# Patient Record
Sex: Male | Born: 1960 | Race: White | Hispanic: No | Marital: Single | Smoking: Current every day smoker
Health system: Southern US, Community
[De-identification: ages and names within clinical notes are randomized; demographics above are authoritative.]

## PROBLEM LIST (undated history)

## (undated) DIAGNOSIS — F32A Depression, unspecified: Secondary | ICD-10-CM

## (undated) DIAGNOSIS — E119 Type 2 diabetes mellitus without complications: Secondary | ICD-10-CM

## (undated) DIAGNOSIS — F319 Bipolar disorder, unspecified: Secondary | ICD-10-CM

## (undated) DIAGNOSIS — F329 Major depressive disorder, single episode, unspecified: Secondary | ICD-10-CM

## (undated) DIAGNOSIS — J45909 Unspecified asthma, uncomplicated: Secondary | ICD-10-CM

## (undated) DIAGNOSIS — I10 Essential (primary) hypertension: Secondary | ICD-10-CM

## (undated) DIAGNOSIS — F410 Panic disorder [episodic paroxysmal anxiety] without agoraphobia: Secondary | ICD-10-CM

## (undated) DIAGNOSIS — F909 Attention-deficit hyperactivity disorder, unspecified type: Secondary | ICD-10-CM

## (undated) DIAGNOSIS — T7840XA Allergy, unspecified, initial encounter: Secondary | ICD-10-CM

## (undated) DIAGNOSIS — F4310 Post-traumatic stress disorder, unspecified: Secondary | ICD-10-CM

## (undated) HISTORY — DX: Panic disorder (episodic paroxysmal anxiety): F41.0

## (undated) HISTORY — DX: Allergy, unspecified, initial encounter: T78.40XA

## (undated) HISTORY — PX: CARDIAC SURGERY: SHX584

## (undated) HISTORY — DX: Depression, unspecified: F32.A

## (undated) HISTORY — DX: Major depressive disorder, single episode, unspecified: F32.9

## (undated) HISTORY — DX: Bipolar disorder, unspecified: F31.9

## (undated) HISTORY — DX: Post-traumatic stress disorder, unspecified: F43.10

## (undated) HISTORY — DX: Attention-deficit hyperactivity disorder, unspecified type: F90.9

## (undated) HISTORY — PX: FRACTURE SURGERY: SHX138

---

## 2001-10-10 ENCOUNTER — Emergency Department (HOSPITAL_COMMUNITY): Admission: EM | Admit: 2001-10-10 | Discharge: 2001-10-10 | Payer: Self-pay | Admitting: Emergency Medicine

## 2001-10-10 ENCOUNTER — Encounter: Payer: Self-pay | Admitting: Emergency Medicine

## 2006-05-29 ENCOUNTER — Emergency Department: Payer: Self-pay | Admitting: Emergency Medicine

## 2006-07-23 ENCOUNTER — Emergency Department: Payer: Self-pay | Admitting: Internal Medicine

## 2006-07-26 ENCOUNTER — Emergency Department: Payer: Self-pay | Admitting: Unknown Physician Specialty

## 2007-03-26 ENCOUNTER — Emergency Department: Payer: Self-pay | Admitting: Emergency Medicine

## 2011-05-28 DIAGNOSIS — I10 Essential (primary) hypertension: Secondary | ICD-10-CM | POA: Insufficient documentation

## 2011-05-28 DIAGNOSIS — I152 Hypertension secondary to endocrine disorders: Secondary | ICD-10-CM | POA: Insufficient documentation

## 2011-10-27 ENCOUNTER — Emergency Department: Payer: Self-pay | Admitting: *Deleted

## 2011-10-28 LAB — CBC WITH DIFFERENTIAL/PLATELET
Basophil #: 0.1 10*3/uL (ref 0.0–0.1)
Basophil %: 1 %
Eosinophil #: 0.3 10*3/uL (ref 0.0–0.7)
Eosinophil %: 2.8 %
HCT: 45.6 % (ref 40.0–52.0)
HGB: 15.8 g/dL (ref 13.0–18.0)
Lymphocyte #: 3.5 10*3/uL (ref 1.0–3.6)
Lymphocyte %: 29.5 %
MCH: 30.5 pg (ref 26.0–34.0)
MCHC: 34.7 g/dL (ref 32.0–36.0)
MCV: 88 fL (ref 80–100)
Monocyte #: 1.2 10*3/uL — ABNORMAL HIGH (ref 0.0–0.7)
Monocyte %: 9.7 %
Neutrophil #: 6.9 10*3/uL — ABNORMAL HIGH (ref 1.4–6.5)
Neutrophil %: 57 %
Platelet: 301 10*3/uL (ref 150–440)
RBC: 5.18 10*6/uL (ref 4.40–5.90)
RDW: 13.3 % (ref 11.5–14.5)
WBC: 12 10*3/uL — ABNORMAL HIGH (ref 3.8–10.6)

## 2011-10-28 LAB — TROPONIN I: Troponin-I: <0.02 ng/mL

## 2011-10-28 LAB — BASIC METABOLIC PANEL
Anion Gap: 12 (ref 7–16)
BUN: 19 mg/dL — ABNORMAL HIGH (ref 7–18)
Calcium, Total: 9.6 mg/dL (ref 8.5–10.1)
Chloride: 103 mmol/L (ref 98–107)
Co2: 24 mmol/L (ref 21–32)
Creatinine: 1.58 mg/dL — ABNORMAL HIGH (ref 0.60–1.30)
EGFR (African American): >60
EGFR (Non-African Amer.): 50 — ABNORMAL LOW
Glucose: 90 mg/dL (ref 65–99)
Osmolality: 279 (ref 275–301)
Potassium: 4.1 mmol/L (ref 3.5–5.1)
Sodium: 139 mmol/L (ref 136–145)

## 2011-10-28 LAB — CK TOTAL AND CKMB (NOT AT ARMC)
CK, Total: 414 U/L — ABNORMAL HIGH (ref 35–232)
CK-MB: 1 ng/mL (ref 0.5–3.6)

## 2012-02-21 ENCOUNTER — Emergency Department: Payer: Self-pay | Admitting: Emergency Medicine

## 2012-02-21 LAB — CBC
HCT: 44.1 % (ref 40.0–52.0)
HGB: 15.2 g/dL (ref 13.0–18.0)
MCH: 29.9 pg (ref 26.0–34.0)
MCHC: 34.5 g/dL (ref 32.0–36.0)
MCV: 87 fL (ref 80–100)
Platelet: 234 10*3/uL (ref 150–440)
RBC: 5.09 10*6/uL (ref 4.40–5.90)
RDW: 13.2 % (ref 11.5–14.5)
WBC: 9 10*3/uL (ref 3.8–10.6)

## 2012-02-21 LAB — COMPREHENSIVE METABOLIC PANEL
Albumin: 3.6 g/dL (ref 3.4–5.0)
Alkaline Phosphatase: 65 U/L (ref 50–136)
Anion Gap: 10 (ref 7–16)
BUN: 15 mg/dL (ref 7–18)
Bilirubin,Total: 0.4 mg/dL (ref 0.2–1.0)
Calcium, Total: 8.2 mg/dL — ABNORMAL LOW (ref 8.5–10.1)
Chloride: 107 mmol/L (ref 98–107)
Co2: 24 mmol/L (ref 21–32)
Creatinine: 1.1 mg/dL (ref 0.60–1.30)
EGFR (African American): >60
EGFR (Non-African Amer.): >60
Glucose: 115 mg/dL — ABNORMAL HIGH (ref 65–99)
Osmolality: 283 (ref 275–301)
Potassium: 4.1 mmol/L (ref 3.5–5.1)
SGOT(AST): 27 U/L (ref 15–37)
SGPT (ALT): 33 U/L
Sodium: 141 mmol/L (ref 136–145)
Total Protein: 7.4 g/dL (ref 6.4–8.2)

## 2012-02-21 LAB — PRO B NATRIURETIC PEPTIDE: B-Type Natriuretic Peptide: 19 pg/mL (ref 0–125)

## 2012-02-21 LAB — ETHANOL
Ethanol %: <0.003 % (ref 0.000–0.080)
Ethanol: <3 mg/dL

## 2012-02-21 LAB — TROPONIN I: Troponin-I: <0.02 ng/mL

## 2012-02-21 LAB — PROTIME-INR
INR: 0.9
Prothrombin Time: 12.7 secs (ref 11.5–14.7)

## 2012-02-21 LAB — ACETAMINOPHEN LEVEL: Acetaminophen: <2 ug/mL

## 2012-02-21 LAB — CK TOTAL AND CKMB (NOT AT ARMC)
CK, Total: 183 U/L (ref 35–232)
CK-MB: 0.8 ng/mL (ref 0.5–3.6)

## 2012-02-21 LAB — SALICYLATE LEVEL: Salicylates, Serum: 1.9 mg/dL

## 2012-05-10 ENCOUNTER — Emergency Department: Payer: Self-pay | Admitting: Internal Medicine

## 2014-10-21 ENCOUNTER — Emergency Department: Payer: Self-pay | Admitting: Internal Medicine

## 2015-01-27 ENCOUNTER — Emergency Department
Admit: 2015-01-27 | Disposition: A | Discharge status: Home / Self Care | Payer: Self-pay | Admitting: Emergency Medicine

## 2015-01-27 LAB — CBC
HCT: 44.5 % (ref 40.0–52.0)
HGB: 15.5 g/dL (ref 13.0–18.0)
MCH: 29.5 pg (ref 26.0–34.0)
MCHC: 34.8 g/dL (ref 32.0–36.0)
MCV: 85 fL (ref 80–100)
Platelet: 282 10*3/uL (ref 150–440)
RBC: 5.26 10*6/uL (ref 4.40–5.90)
RDW: 13.2 % (ref 11.5–14.5)
WBC: 12.2 10*3/uL — ABNORMAL HIGH (ref 3.8–10.6)

## 2015-01-27 LAB — COMPREHENSIVE METABOLIC PANEL
Albumin: 4.7 g/dL
Alkaline Phosphatase: 56 U/L
Anion Gap: 9 (ref 7–16)
BUN: 18 mg/dL
Bilirubin,Total: 0.7 mg/dL
Calcium, Total: 9.5 mg/dL
Chloride: 106 mmol/L
Co2: 21 mmol/L — ABNORMAL LOW
Creatinine: 1.58 mg/dL — ABNORMAL HIGH
EGFR (African American): 57 — ABNORMAL LOW
EGFR (Non-African Amer.): 49 — ABNORMAL LOW
Glucose: 139 mg/dL — ABNORMAL HIGH
Potassium: 4.3 mmol/L
SGOT(AST): 46 U/L — ABNORMAL HIGH
SGPT (ALT): 44 U/L
Sodium: 136 mmol/L
Total Protein: 8 g/dL

## 2015-01-27 LAB — URINALYSIS, COMPLETE
Bilirubin,UR: NEGATIVE
Blood: NEGATIVE
Glucose,UR: NEGATIVE mg/dL (ref 0–75)
Ketone: NEGATIVE
Leukocyte Esterase: NEGATIVE
Nitrite: NEGATIVE
Ph: 5 (ref 4.5–8.0)
Protein: NEGATIVE
Specific Gravity: >1.06 (ref 1.003–1.030)

## 2015-01-27 LAB — LIPASE, BLOOD: Lipase: 30 U/L

## 2015-01-27 LAB — TROPONIN I: Troponin-I: 0.03 ng/mL

## 2015-06-14 ENCOUNTER — Encounter: Payer: Self-pay | Admitting: Emergency Medicine

## 2015-06-14 ENCOUNTER — Emergency Department: Payer: Self-pay

## 2015-06-14 ENCOUNTER — Emergency Department
Admission: EM | Admit: 2015-06-14 | Discharge: 2015-06-14 | Disposition: A | Discharge status: Home / Self Care | Payer: Self-pay | Attending: Emergency Medicine | Admitting: Emergency Medicine

## 2015-06-14 DIAGNOSIS — Y9289 Other specified places as the place of occurrence of the external cause: Secondary | ICD-10-CM | POA: Insufficient documentation

## 2015-06-14 DIAGNOSIS — I10 Essential (primary) hypertension: Secondary | ICD-10-CM | POA: Insufficient documentation

## 2015-06-14 DIAGNOSIS — Z880 Allergy status to penicillin: Secondary | ICD-10-CM | POA: Insufficient documentation

## 2015-06-14 DIAGNOSIS — M545 Low back pain, unspecified: Secondary | ICD-10-CM

## 2015-06-14 DIAGNOSIS — S39012A Strain of muscle, fascia and tendon of lower back, initial encounter: Secondary | ICD-10-CM | POA: Insufficient documentation

## 2015-06-14 DIAGNOSIS — M5136 Other intervertebral disc degeneration, lumbar region: Secondary | ICD-10-CM | POA: Insufficient documentation

## 2015-06-14 DIAGNOSIS — X58XXXA Exposure to other specified factors, initial encounter: Secondary | ICD-10-CM | POA: Insufficient documentation

## 2015-06-14 DIAGNOSIS — Y9389 Activity, other specified: Secondary | ICD-10-CM | POA: Insufficient documentation

## 2015-06-14 DIAGNOSIS — Y998 Other external cause status: Secondary | ICD-10-CM | POA: Insufficient documentation

## 2015-06-14 DIAGNOSIS — M62830 Muscle spasm of back: Secondary | ICD-10-CM | POA: Insufficient documentation

## 2015-06-14 HISTORY — DX: Panic disorder (episodic paroxysmal anxiety): F41.0

## 2015-06-14 HISTORY — DX: Essential (primary) hypertension: I10

## 2015-06-14 MED ORDER — DIAZEPAM 5 MG/ML IJ SOLN
5.0000 mg | Freq: Once | INTRAMUSCULAR | Status: AC
  Administered 2015-06-14: 5 mg via INTRAVENOUS
  Filled 2015-06-14: qty 2

## 2015-06-14 MED ORDER — DIAZEPAM 5 MG PO TABS: 5.0000 mg | ORAL_TABLET | Freq: Three times a day (TID) | ORAL | Status: DC | PRN

## 2015-06-14 MED ORDER — HYDROMORPHONE HCL 1 MG/ML IJ SOLN
1.0000 mg | Freq: Once | INTRAMUSCULAR | Status: AC
  Administered 2015-06-14: 1 mg via INTRAVENOUS
  Filled 2015-06-14: qty 1

## 2015-06-14 MED ORDER — DEXAMETHASONE SODIUM PHOSPHATE 10 MG/ML IJ SOLN
10.0000 mg | Freq: Once | INTRAMUSCULAR | Status: AC
  Administered 2015-06-14: 10 mg via INTRAVENOUS
  Filled 2015-06-14: qty 1

## 2015-06-14 MED ORDER — KETOROLAC TROMETHAMINE 30 MG/ML IJ SOLN
30.0000 mg | Freq: Once | INTRAMUSCULAR | Status: AC
  Administered 2015-06-14: 30 mg via INTRAVENOUS
  Filled 2015-06-14: qty 1

## 2015-06-14 MED ORDER — OXYCODONE-ACETAMINOPHEN 5-325 MG PO TABS: 1.0000 | ORAL_TABLET | Freq: Four times a day (QID) | ORAL | Status: DC | PRN

## 2015-06-14 MED ORDER — PREDNISONE 50 MG PO TABS: ORAL_TABLET | ORAL | Status: DC

## 2015-06-14 NOTE — ED Notes (Signed)
Pt c/o low back today, hx of back pain and broken tailbone in the past. States he heard something pop today and now having severe low back pain.

## 2015-06-14 NOTE — Discharge Instructions (Signed)
Back Exercises Back exercises help treat and prevent back injuries. The goal of back exercises is to increase the strength of your abdominal and back muscles and the flexibility of your back. These exercises should be started when you no longer have back pain. Back exercises include:  Pelvic Tilt. Lie on your back with your knees bent. Tilt your pelvis until the lower part of your back is against the floor. Hold this position 5 to 10 sec and repeat 5 to 10 times.  Knee to Chest. Pull first 1 knee up against your chest and hold for 20 to 30 seconds, repeat this with the other knee, and then both knees. This may be done with the other leg straight or bent, whichever feels better.  Sit-Ups or Curl-Ups. Bend your knees 90 degrees. Start with tilting your pelvis, and do a partial, slow sit-up, lifting your trunk only 30 to 45 degrees off the floor. Take at least 2 to 3 seconds for each sit-up. Do not do sit-ups with your knees out straight. If partial sit-ups are difficult, simply do the above but with only tightening your abdominal muscles and holding it as directed.  Hip-Lift. Lie on your back with your knees flexed 90 degrees. Push down with your feet and shoulders as you raise your hips a couple inches off the floor; hold for 10 seconds, repeat 5 to 10 times.  Back arches. Lie on your stomach, propping yourself up on bent elbows. Slowly press on your hands, causing an arch in your low back. Repeat 3 to 5 times. Any initial stiffness and discomfort should lessen with repetition over time.  Shoulder-Lifts. Lie face down with arms beside your body. Keep hips and torso pressed to floor as you slowly lift your head and shoulders off the floor. Do not overdo your exercises, especially in the beginning. Exercises may cause you some mild back discomfort which lasts for a few minutes; however, if the pain is more severe, or lasts for more than 15 minutes, do not continue exercises until you see your caregiver.  Improvement with exercise therapy for back problems is slow.  See your caregivers for assistance with developing a proper back exercise program. Document Released: 10/25/2004 Document Revised: 12/10/2011 Document Reviewed: 07/19/2011 [facility] Patient Information 2015 [place], [place]. This information is not intended to replace advice given to you by your health care provider. Make sure you discuss any questions you have with your health care provider.  Low Back Sprain with Rehab  A sprain is an injury in which a ligament is torn. The ligaments of the lower back are vulnerable to sprains. However, they are strong and require great force to be injured. These ligaments are important for stabilizing the spinal column. Sprains are classified into three categories. Grade 1 sprains cause pain, but the tendon is not lengthened. Grade 2 sprains include a lengthened ligament, due to the ligament being stretched or partially ruptured. With grade 2 sprains there is still function, although the function may be decreased. Grade 3 sprains involve a complete tear of the tendon or muscle, and function is usually impaired. SYMPTOMS   Severe pain in the lower back.  Sometimes, a feeling of a "pop," "snap," or tear, at the time of injury.  Tenderness and sometimes swelling at the injury site.  Uncommonly, bruising (contusion) within 48 hours of injury.  Muscle spasms in the back. CAUSES  Low back sprains occur when a force is placed on the ligaments that is greater than they can  handle. Common causes of injury include:  Performing a stressful act while off-balance.  Repetitive stressful activities that involve movement of the lower back.  Direct hit (trauma) to the lower back. RISK INCREASES WITH:  Contact sports (football, wrestling).  Collisions (major skiing accidents).  Sports that require throwing or lifting (baseball, weightlifting).  Sports involving twisting of the spine (gymnastics, diving,  tennis, golf).  Poor strength and flexibility.  Inadequate protection.  Previous back injury or surgery (especially fusion). PREVENTION  Wear properly fitted and padded protective equipment.  Warm up and stretch properly before activity.  Allow for adequate recovery between workouts.  Maintain physical fitness:  Strength, flexibility, and endurance.  Cardiovascular fitness.  Maintain a healthy body weight. PROGNOSIS  If treated properly, low back sprains usually heal with non-surgical treatment. The length of time for healing depends on the severity of the injury.  RELATED COMPLICATIONS   Recurring symptoms, resulting in a chronic problem.  Chronic inflammation and pain in the low back.  Delayed healing or resolution of symptoms, especially if activity is resumed too soon.  Prolonged impairment.  Unstable or arthritic joints of the low back. TREATMENT  Treatment first involves the use of ice and medicine, to reduce pain and inflammation. The use of strengthening and stretching exercises may help reduce pain with activity. These exercises may be performed at home or with a therapist. Severe injuries may require referral to a therapist for further evaluation and treatment, such as ultrasound. Your caregiver may advise that you wear a back brace or corset, to help reduce pain and discomfort. Often, prolonged bed rest results in greater harm then benefit. Corticosteroid injections may be recommended. However, these should be reserved for the most serious cases. It is important to avoid using your back when lifting objects. At night, sleep on your back on a firm mattress, with a pillow placed under your knees. If non-surgical treatment is unsuccessful, surgery may be needed.  MEDICATION   If pain medicine is needed, nonsteroidal anti-inflammatory medicines (aspirin and ibuprofen), or other minor pain relievers (acetaminophen), are often advised.  Do not take pain medicine for 7  days before surgery.  Prescription pain relievers may be given, if your caregiver thinks they are needed. Use only as directed and only as much as you need.  Ointments applied to the skin may be helpful.  Corticosteroid injections may be given by your caregiver. These injections should be reserved for the most serious cases, because they may only be given a certain number of times. HEAT AND COLD  Cold treatment (icing) should be applied for 10 to 15 minutes every 2 to 3 hours for inflammation and pain, and immediately after activity that aggravates your symptoms. Use ice packs or an ice massage.  Heat treatment may be used before performing stretching and strengthening activities prescribed by your caregiver, physical therapist, or athletic trainer. Use a heat pack or a warm water soak. SEEK MEDICAL CARE IF:   Symptoms get worse or do not improve in 2 to 4 weeks, despite treatment.  You develop numbness or weakness in either leg.  You lose bowel or bladder function.  Any of the following occur after surgery: fever, increased pain, swelling, redness, drainage of fluids, or bleeding in the affected area.  New, unexplained symptoms develop. (Drugs used in treatment may produce side effects.) EXERCISES  RANGE OF MOTION (ROM) AND STRETCHING EXERCISES - Low Back Sprain Most people with lower back pain will find that their symptoms get worse with  excessive bending forward (flexion) or arching at the lower back (extension). The exercises that will help resolve your symptoms will focus on the opposite motion.  Your physician, physical therapist or athletic trainer will help you determine which exercises will be most helpful to resolve your lower back pain. Do not complete any exercises without first consulting with your caregiver. Discontinue any exercises which make your symptoms worse, until you speak to your caregiver. If you have pain, numbness or tingling which travels down into your buttocks,  leg or foot, the goal of the therapy is for these symptoms to move closer to your back and eventually resolve. Sometimes, these leg symptoms will get better, but your lower back pain may worsen. This is often an indication of progress in your rehabilitation. Be very alert to any changes in your symptoms and the activities in which you participated in the 24 hours prior to the change. Sharing this information with your caregiver will allow him or her to most efficiently treat your condition. These exercises may help you when beginning to rehabilitate your injury. Your symptoms may resolve with or without further involvement from your physician, physical therapist or athletic trainer. While completing these exercises, remember:   Restoring tissue flexibility helps normal motion to return to the joints. This allows healthier, less painful movement and activity.  An effective stretch should be held for at least 30 seconds.  A stretch should never be painful. You should only feel a gentle lengthening or release in the stretched tissue. FLEXION RANGE OF MOTION AND STRETCHING EXERCISES: STRETCH - Flexion, Single Knee to Chest   Lie on a firm bed or floor with both legs extended in front of you.  Keeping one leg in contact with the floor, bring your opposite knee to your chest. Hold your leg in place by either grabbing behind your thigh or at your knee.  Pull until you feel a gentle stretch in your low back. Hold __________ seconds.  Slowly release your grasp and repeat the exercise with the opposite side. Repeat __________ times. Complete this exercise __________ times per day.  STRETCH - Flexion, Double Knee to Chest  Lie on a firm bed or floor with both legs extended in front of you.  Keeping one leg in contact with the floor, bring your opposite knee to your chest.  Tense your stomach muscles to support your back and then lift your other knee to your chest. Hold your legs in place by either  grabbing behind your thighs or at your knees.  Pull both knees toward your chest until you feel a gentle stretch in your low back. Hold __________ seconds.  Tense your stomach muscles and slowly return one leg at a time to the floor. Repeat __________ times. Complete this exercise __________ times per day.  STRETCH - Low Trunk Rotation  Lie on a firm bed or floor. Keeping your legs in front of you, bend your knees so they are both pointed toward the ceiling and your feet are flat on the floor.  Extend your arms out to the side. This will stabilize your upper body by keeping your shoulders in contact with the floor.  Gently and slowly drop both knees together to one side until you feel a gentle stretch in your low back. Hold for __________ seconds.  Tense your stomach muscles to support your lower back as you bring your knees back to the starting position. Repeat the exercise to the other side. Repeat __________ times. Complete this  exercise __________ times per day  EXTENSION RANGE OF MOTION AND FLEXIBILITY EXERCISES: STRETCH - Extension, Prone on Elbows   Lie on your stomach on the floor, a bed will be too soft. Place your palms about shoulder width apart and at the height of your head.  Place your elbows under your shoulders. If this is too painful, stack pillows under your chest.  Allow your body to relax so that your hips drop lower and make contact more completely with the floor.  Hold this position for __________ seconds.  Slowly return to lying flat on the floor. Repeat __________ times. Complete this exercise __________ times per day.  RANGE OF MOTION - Extension, Prone Press Ups  Lie on your stomach on the floor, a bed will be too soft. Place your palms about shoulder width apart and at the height of your head.  Keeping your back as relaxed as possible, slowly straighten your elbows while keeping your hips on the floor. You may adjust the placement of your hands to maximize  your comfort. As you gain motion, your hands will come more underneath your shoulders.  Hold this position __________ seconds.  Slowly return to lying flat on the floor. Repeat __________ times. Complete this exercise __________ times per day.  RANGE OF MOTION- Quadruped, Neutral Spine   Assume a hands and knees position on a firm surface. Keep your hands under your shoulders and your knees under your hips. You may place padding under your knees for comfort.  Drop your head and point your tailbone toward the ground below you. This will round out your lower back like an angry cat. Hold this position for __________ seconds.  Slowly lift your head and release your tail bone so that your back sags into a large arch, like an old horse.  Hold this position for __________ seconds.  Repeat this until you feel limber in your low back.  Now, find your "sweet spot." This will be the most comfortable position somewhere between the two previous positions. This is your neutral spine. Once you have found this position, tense your stomach muscles to support your low back.  Hold this position for __________ seconds. Repeat __________ times. Complete this exercise __________ times per day.  STRENGTHENING EXERCISES - Low Back Sprain These exercises may help you when beginning to rehabilitate your injury. These exercises should be done near your "sweet spot." This is the neutral, low-back arch, somewhere between fully rounded and fully arched, that is your least painful position. When performed in this safe range of motion, these exercises can be used for people who have either a flexion or extension based injury. These exercises may resolve your symptoms with or without further involvement from your physician, physical therapist or athletic trainer. While completing these exercises, remember:   Muscles can gain both the endurance and the strength needed for everyday activities through controlled  exercises.  Complete these exercises as instructed by your physician, physical therapist or athletic trainer. Increase the resistance and repetitions only as guided.  You may experience muscle soreness or fatigue, but the pain or discomfort you are trying to eliminate should never worsen during these exercises. If this pain does worsen, stop and make certain you are following the directions exactly. If the pain is still present after adjustments, discontinue the exercise until you can discuss the trouble with your caregiver. STRENGTHENING - Deep Abdominals, Pelvic Tilt   Lie on a firm bed or floor. Keeping your legs in front of you,  bend your knees so they are both pointed toward the ceiling and your feet are flat on the floor.  Tense your lower abdominal muscles to press your low back into the floor. This motion will rotate your pelvis so that your tail bone is scooping upwards rather than pointing at your feet or into the floor. With a gentle tension and even breathing, hold this position for __________ seconds. Repeat __________ times. Complete this exercise __________ times per day.  STRENGTHENING - Abdominals, Crunches   Lie on a firm bed or floor. Keeping your legs in front of you, bend your knees so they are both pointed toward the ceiling and your feet are flat on the floor. Cross your arms over your chest.  Slightly tip your chin down without bending your neck.  Tense your abdominals and slowly lift your trunk high enough to just clear your shoulder blades. Lifting higher can put excessive stress on the lower back and does not further strengthen your abdominal muscles.  Control your return to the starting position. Repeat __________ times. Complete this exercise __________ times per day.  STRENGTHENING - Quadruped, Opposite UE/LE Lift   Assume a hands and knees position on a firm surface. Keep your hands under your shoulders and your knees under your hips. You may place padding under  your knees for comfort.  Find your neutral spine and gently tense your abdominal muscles so that you can maintain this position. Your shoulders and hips should form a rectangle that is parallel with the floor and is not twisted.  Keeping your trunk steady, lift your right hand no higher than your shoulder and then your left leg no higher than your hip. Make sure you are not holding your breath. Hold this position for __________ seconds.  Continuing to keep your abdominal muscles tense and your back steady, slowly return to your starting position. Repeat with the opposite arm and leg. Repeat __________ times. Complete this exercise __________ times per day.  STRENGTHENING - Abdominals and Quadriceps, Straight Leg Raise   Lie on a firm bed or floor with both legs extended in front of you.  Keeping one leg in contact with the floor, bend the other knee so that your foot can rest flat on the floor.  Find your neutral spine, and tense your abdominal muscles to maintain your spinal position throughout the exercise.  Slowly lift your straight leg off the floor about 6 inches for a count of 15, making sure to not hold your breath.  Still keeping your neutral spine, slowly lower your leg all the way to the floor. Repeat this exercise with each leg __________ times. Complete this exercise __________ times per day. POSTURE AND BODY MECHANICS CONSIDERATIONS - Low Back Sprain Keeping correct posture when sitting, standing or completing your activities will reduce the stress put on different body tissues, allowing injured tissues a chance to heal and limiting painful experiences. The following are general guidelines for improved posture. Your physician or physical therapist will provide you with any instructions specific to your needs. While reading these guidelines, remember:  The exercises prescribed by your provider will help you have the flexibility and strength to maintain correct postures.  The  correct posture provides the best environment for your joints to work. All of your joints have less wear and tear when properly supported by a spine with good posture. This means you will experience a healthier, less painful body.  Correct posture must be practiced with all of your activities,  especially prolonged sitting and standing. Correct posture is as important when doing repetitive low-stress activities (typing) as it is when doing a single heavy-load activity (lifting). RESTING POSITIONS Consider which positions are most painful for you when choosing a resting position. If you have pain with flexion-based activities (sitting, bending, stooping, squatting), choose a position that allows you to rest in a less flexed posture. You would want to avoid curling into a fetal position on your side. If your pain worsens with extension-based activities (prolonged standing, working overhead), avoid resting in an extended position such as sleeping on your stomach. Most people will find more comfort when they rest with their spine in a more neutral position, neither too rounded nor too arched. Lying on a non-sagging bed on your side with a pillow between your knees, or on your back with a pillow under your knees will often provide some relief. Keep in mind, being in any one position for a prolonged period of time, no matter how correct your posture, can still lead to stiffness. PROPER SITTING POSTURE In order to minimize stress and discomfort on your spine, you must sit with correct posture. Sitting with good posture should be effortless for a healthy body. Returning to good posture is a gradual process. Many people can work toward this most comfortably by using various supports until they have the flexibility and strength to maintain this posture on their own. When sitting with proper posture, your ears will fall over your shoulders and your shoulders will fall over your hips. You should use the back of the chair  to support your upper back. Your lower back will be in a neutral position, just slightly arched. You may place a small pillow or folded towel at the base of your lower back for  support.  When working at a desk, create an environment that supports good, upright posture. Without extra support, muscles tire, which leads to excessive strain on joints and other tissues. Keep these recommendations in mind: CHAIR:  A chair should be able to slide under your desk when your back makes contact with the back of the chair. This allows you to work closely.  The chair's height should allow your eyes to be level with the upper part of your monitor and your hands to be slightly lower than your elbows. BODY POSITION  Your feet should make contact with the floor. If this is not possible, use a foot rest.  Keep your ears over your shoulders. This will reduce stress on your neck and low back. INCORRECT SITTING POSTURES  If you are feeling tired and unable to assume a healthy sitting posture, do not slouch or slump. This puts excessive strain on your back tissues, causing more damage and pain. Healthier options include:  Using more support, like a lumbar pillow.  Switching tasks to something that requires you to be upright or walking.  Talking a brief walk.  Lying down to rest in a neutral-spine position. PROLONGED STANDING WHILE SLIGHTLY LEANING FORWARD  When completing a task that requires you to lean forward while standing in one place for a long time, place either foot up on a stationary 2-4 inch high object to help maintain the best posture. When both feet are on the ground, the lower back tends to lose its slight inward curve. If this curve flattens (or becomes too large), then the back and your other joints will experience too much stress, tire more quickly, and can cause pain. CORRECT STANDING POSTURES Proper standing  posture should be assumed with all daily activities, even if they only take a few  moments, like when brushing your teeth. As in sitting, your ears should fall over your shoulders and your shoulders should fall over your hips. You should keep a slight tension in your abdominal muscles to brace your spine. Your tailbone should point down to the ground, not behind your body, resulting in an over-extended swayback posture.  INCORRECT STANDING POSTURES  Common incorrect standing postures include a forward head, locked knees and/or an excessive swayback. WALKING Walk with an upright posture. Your ears, shoulders and hips should all line-up. PROLONGED ACTIVITY IN A FLEXED POSITION When completing a task that requires you to bend forward at your waist or lean over a low surface, try to find a way to stabilize 3 out of 4 of your limbs. You can place a hand or elbow on your thigh or rest a knee on the surface you are reaching across. This will provide you more stability, so that your muscles do not tire as quickly. By keeping your knees relaxed, or slightly bent, you will also reduce stress across your lower back. CORRECT LIFTING TECHNIQUES DO :  Assume a wide stance. This will provide you more stability and the opportunity to get as close as possible to the object which you are lifting.  Tense your abdominals to brace your spine. Bend at the knees and hips. Keeping your back locked in a neutral-spine position, lift using your leg muscles. Lift with your legs, keeping your back straight.  Test the weight of unknown objects before attempting to lift them.  Try to keep your elbows locked down at your sides in order get the best strength from your shoulders when carrying an object.  Always ask for help when lifting heavy or awkward objects. INCORRECT LIFTING TECHNIQUES DO NOT:   Lock your knees when lifting, even if it is a small object.  Bend and twist. Pivot at your feet or move your feet when needing to change directions.  Assume that you can safely pick up even a paperclip  without proper posture. Document Released: 09/17/2005 Document Revised: 12/10/2011 Document Reviewed: 12/30/2008 [facility] Patient Information 2015 [place], [place]. This information is not intended to replace advice given to you by your health care provider. Make sure you discuss any questions you have with your health care provider.  Degenerative Disk Disease Degenerative disk disease is a condition caused by the changes that occur in the cushions of the backbone (spinal disks) as you grow older. Spinal disks are soft and compressible disks located between the bones of the spine (vertebrae). They act like shock absorbers. Degenerative disk disease can affect the whole spine. However, the neck and lower back are most commonly affected. Many changes can occur in the spinal disks with aging, such as:  The spinal disks may dry and shrink.  Small tears may occur in the tough, outer covering of the disk (annulus).  The disk space may become smaller due to loss of water.  Abnormal growths in the bone (spurs) may occur. This can put pressure on the nerve roots exiting the spinal canal, causing pain.  The spinal canal may become narrowed. CAUSES  Degenerative disk disease is a condition caused by the changes that occur in the spinal disks with aging. The exact cause is not known, but there is a genetic basis for many patients. Degenerative changes can occur due to loss of fluid in the disk. This makes the disk thinner  and reduces the space between the backbones. Small cracks can develop in the outer layer of the disk. This can lead to the breakdown of the disk. You are more likely to get degenerative disk disease if you are overweight. Smoking cigarettes and doing heavy work such as weightlifting can also increase your risk of this condition. Degenerative changes can start after a sudden injury. Growth of bone spurs can compress the nerve roots and cause pain.  SYMPTOMS  The symptoms vary from person to  person. Some people may have no pain, while others have severe pain. The pain may be so severe that it can limit your activities. The location of the pain depends on the part of your backbone that is affected. You will have neck or arm pain if a disk in the neck area is affected. You will have pain in your back, buttocks, or legs if a disk in the lower back is affected. The pain becomes worse while bending, reaching up, or with twisting movements. The pain may start gradually and then get worse as time passes. It may also start after a major or minor injury. You may feel numbness or tingling in the arms or legs.  DIAGNOSIS  Your caregiver will ask you about your symptoms and about activities or habits that may cause the pain. He or she may also ask about any injuries, diseases, or treatments you have had earlier. Your caregiver will examine you to check for the range of movement that is possible in the affected area, to check for strength in your extremities, and to check for sensation in the areas of the arms and legs supplied by different nerve roots. An X-ray of the spine may be taken. Your caregiver may suggest other imaging tests, such as magnetic resonance imaging (MRI), if needed.  TREATMENT  Treatment includes rest, modifying your activities, and applying ice and heat. Your caregiver may prescribe medicines to reduce your pain and may ask you to do some exercises to strengthen your back. In some cases, you may need surgery. You and your caregiver will decide on the treatment that is best for you. HOME CARE INSTRUCTIONS   Follow proper lifting and walking techniques as advised by your caregiver.  Maintain good posture.  Exercise regularly as advised.  Perform relaxation exercises.  Change your sitting, standing, and sleeping habits as advised. Change positions frequently.  Lose weight as advised.  Stop smoking if you smoke.  Wear supportive footwear. SEEK MEDICAL CARE IF:  Your pain  does not go away within 1 to 4 weeks. SEEK IMMEDIATE MEDICAL CARE IF:   Your pain is severe.  You notice weakness in your arms, hands, or legs.  You begin to lose control of your bladder or bowel movements. MAKE SURE YOU:   Understand these instructions.  Will watch your condition.  Will get help right away if you are not doing well or get worse. Document Released: 07/15/2007 Document Revised: 12/10/2011 Document Reviewed: 01/19/2014 [facility] Patient Information 2015 [place], [place]. This information is not intended to replace advice given to you by your health care provider. Make sure you discuss any questions you have with your health care provider.

## 2015-06-14 NOTE — ED Provider Notes (Addendum)
[facility] Emergency Department Provider Note     Time seen: ----------------------------------------- 1:59 PM on 06/14/2015 -----------------------------------------    I have reviewed the triage vital signs and the nursing notes.   HISTORY  Chief Complaint Back Pain    HPI Ronald Lucas is a 54 y.o. male who presents ER with severe low back pain. Patient states he went to get up from a sitting position when he felt a pop and heard a pop in his low back. He's been having severe low back pain since that period of time. He states in the remote past he had injured his back years ago but it resolved spontaneously. He described his arms and legs going numb earlier.   Past Medical History  Diagnosis Date  . Hypertension   . Panic attacks     There are no active problems to display for this patient.   No past surgical history on file.  Allergies Buspar; Morphine and related; Penicillins; and Tetanus toxoids  Social History Social History  Substance Use Topics  . Smoking status: Current Some Day Smoker  . Smokeless tobacco: None  . Alcohol Use: No    Review of Systems Constitutional: Negative for fever. Eyes: Negative for visual changes. ENT: Negative for sore throat. Cardiovascular: Negative for chest pain. Respiratory: Negative for shortness of breath. Gastrointestinal: Negative for abdominal pain, vomiting and diarrhea. Genitourinary: Negative for dysuria. Musculoskeletal: Positive for low back pain Skin: Negative for rash. Neurological: Negative for headaches, focal weakness. Positive for numbness in his arms and legs  10-point ROS otherwise negative.  ____________________________________________   PHYSICAL EXAM:  VITAL SIGNS: ED Triage Vitals  Enc Vitals Group     BP 06/14/15 1343 151/127 mmHg     Pulse Rate 06/14/15 1341 105     Resp 06/14/15 1341 20     Temp 06/14/15 1341 98.1 F (36.7 C)     Temp Source 06/14/15  1341 Oral     SpO2 06/14/15 1341 98 %     Weight 06/14/15 1342 315 lb (142.883 kg)     Height 06/14/15 1341 5\' 11"  (1.803 m)     Head Cir --      Peak Flow --      Pain Score 06/14/15 1342 10     Pain Loc --      Pain Edu? --      Excl. in [place]? --     Constitutional: Alert and oriented. Mild to moderate distress, very anxious Eyes: Conjunctivae are normal. PERRL. Normal extraocular movements. ENT   Head: Normocephalic and atraumatic.   Nose: No congestion/rhinnorhea.   Mouth/Throat: Mucous membranes are moist.   Neck: No stridor. Cardiovascular: Normal rate, regular rhythm. Normal and symmetric distal pulses are present in all extremities. No murmurs, rubs, or gallops. Respiratory: Normal respiratory effort without tachypnea nor retractions. Breath sounds are clear and equal bilaterally. No wheezes/rales/rhonchi. Gastrointestinal: Soft and nontender. No distention. No abdominal bruits.  Musculoskeletal: Back is tender to touch particularly in the lumbar sacral spine. There is decreased range of motion of the low back Neurologic:  Normal speech and language. No gross focal neurologic deficits are appreciated. Speech is normal.  Skin:  Skin is warm, diaphoresis is noted.  ____________________________________________  ED COURSE:  Pertinent labs & imaging results that were available during my care of the patient were reviewed by me and considered in my medical decision making (see chart for details). Patient will be evaluated with plain films, will receive IV anxiolytics  muscle relaxants and pain medicine.  RADIOLOGY Images were viewed by me  Lumbar spine x-rays Are unremarkable CT was obtained due to persistent low back pain IMPRESSION: 1. No acute osseous abnormality. 2. L4-L5 predominant lumbar degenerative disc disease with central disc protrusion at L4-L5 producing at least mild and probably moderate central stenosis. Consider MRI for further  assessment. ____________________________________________  FINAL ASSESSMENT AND PLAN  Lumbosacral strain, degenerative disc disease  Plan: Patient with labs and imaging as dictated above. Patient with pain out of proportion to exam. He was given IV medications to subdue his symptoms. He'll be discharged with Motrin, prednisone and Valium and encouraged to have close follow-up with his doctor. He is advised he will need an MRI of his lumbar spine. He does not have any radicular pain, no neurosensory deficits at this time.   Earleen Newport, MD   Earleen Newport, MD 06/14/15 [place], MD 06/14/15 1630

## 2015-06-23 ENCOUNTER — Telehealth: Payer: Self-pay | Admitting: Emergency Medicine

## 2015-06-23 NOTE — ED Notes (Signed)
Patient had called saying that the er doctor told him he would give him enough pain meds until his mri.  Says mri is scheduled October?3 and he is out of meds.  He said his doctor has not helped him with his pain, and he does not want to return there.  i told him that we could not give refills from the ED, and that he can go to [facility] for pain management.  He thinks he may go there.

## 2015-07-04 ENCOUNTER — Encounter: Payer: Self-pay | Admitting: Emergency Medicine

## 2015-07-04 ENCOUNTER — Emergency Department
Admission: EM | Admit: 2015-07-04 | Discharge: 2015-07-04 | Disposition: A | Discharge status: Home / Self Care | Payer: Self-pay | Attending: Student | Admitting: Student

## 2015-07-04 DIAGNOSIS — Z880 Allergy status to penicillin: Secondary | ICD-10-CM | POA: Insufficient documentation

## 2015-07-04 DIAGNOSIS — N319 Neuromuscular dysfunction of bladder, unspecified: Secondary | ICD-10-CM | POA: Insufficient documentation

## 2015-07-04 DIAGNOSIS — R1110 Vomiting, unspecified: Secondary | ICD-10-CM | POA: Insufficient documentation

## 2015-07-04 DIAGNOSIS — M5440 Lumbago with sciatica, unspecified side: Secondary | ICD-10-CM | POA: Insufficient documentation

## 2015-07-04 DIAGNOSIS — Z720 Tobacco use: Secondary | ICD-10-CM | POA: Insufficient documentation

## 2015-07-04 DIAGNOSIS — I10 Essential (primary) hypertension: Secondary | ICD-10-CM | POA: Insufficient documentation

## 2015-07-04 MED ORDER — OXYCODONE-ACETAMINOPHEN 5-325 MG PO TABS: 1.0000 | ORAL_TABLET | ORAL | Status: DC | PRN

## 2015-07-04 MED ORDER — OXYCODONE-ACETAMINOPHEN 5-325 MG PO TABS
2.0000 | ORAL_TABLET | Freq: Once | ORAL | Status: AC
  Administered 2015-07-04: 2 via ORAL
  Filled 2015-07-04: qty 2

## 2015-07-04 MED ORDER — KETOROLAC TROMETHAMINE 60 MG/2ML IM SOLN
60.0000 mg | Freq: Once | INTRAMUSCULAR | Status: AC
  Administered 2015-07-04: 60 mg via INTRAMUSCULAR
  Filled 2015-07-04: qty 2

## 2015-07-04 MED ORDER — CYCLOBENZAPRINE HCL 10 MG PO TABS
5.0000 mg | ORAL_TABLET | Freq: Once | ORAL | Status: DC
  Filled 2015-07-04: qty 2

## 2015-07-04 NOTE — ED Notes (Signed)
States he is having lower back pain ..which radiates into both legs   Worse to right side.unble to bear wt.

## 2015-07-04 NOTE — ED Provider Notes (Signed)
[facility] Emergency Department Provider Note  ____________________________________________  Time seen: Approximately 3:29 PM  I have reviewed the triage vital signs and the nursing notes.   HISTORY  Chief Complaint Back Pain   HPI Ronald Lucas is a 54 y.o. male presents from Orthopedic clinic for an MRI for evaluation of Cauda Equina syndrome. He presents with complaints of bowel and bladder dysfunction as well as severe low back pain which has caused him to vomit this am.   Past Medical History  Diagnosis Date  . Hypertension   . Panic attacks     There are no active problems to display for this patient.   History reviewed. No pertinent past surgical history.  Current Outpatient Rx  Name  Route  Sig  Dispense  Refill  . oxyCODONE-acetaminophen (ROXICET) 5-325 MG tablet   Oral   Take 1-2 tablets by mouth every 4 (four) hours as needed for severe pain.   8 tablet   0     Allergies Buspar; Morphine and related; Penicillins; Prednisone; and Tetanus toxoids  No family history on file.  Social History Social History  Substance Use Topics  . Smoking status: Current Some Day Smoker  . Smokeless tobacco: None  . Alcohol Use: No    Review of Systems Constitutional: No fever/chills Eyes: No visual changes. ENT: No sore throat. Cardiovascular: Denies chest pain. Respiratory: Denies shortness of breath. Gastrointestinal: No abdominal pain.  No nausea, no vomiting.  No diarrhea.  No constipation. Genitourinary: Negative for dysuria. Musculoskeletal: Positive for severe low back pain. Skin: Negative for rash. Neurological: Negative for headaches, focal weakness or numbness.  10-point ROS otherwise negative.  ____________________________________________   PHYSICAL EXAM:  VITAL SIGNS: ED Triage Vitals  Enc Vitals Group     BP 07/04/15 1520 147/84 mmHg     Pulse Rate 07/04/15 1520 88     Resp 07/04/15 1520 18     Temp 07/04/15  1520 97.6 F (36.4 C)     Temp Source 07/04/15 1520 Oral     SpO2 07/04/15 1520 96 %     Weight 07/04/15 1520 315 lb (142.883 kg)     Height 07/04/15 1520 6\' 2"  (1.88 m)     Head Cir --      Peak Flow --      Pain Score 07/04/15 1516 9     Pain Loc --      Pain Edu? --      Excl. in [place]? --     Constitutional: Alert and oriented. Patient laying facedown on a stretcher unable to get up. Eyes: Conjunctivae are normal. PERRL. EOMI. Head: Atraumatic. Nose: No congestion/rhinnorhea. Mouth/Throat: Mucous membranes are moist.  Oropharynx non-erythematous. Neck: No stridor.   Cardiovascular: Normal rate, regular rhythm. Grossly normal heart sounds.  Good peripheral circulation. Respiratory: Normal respiratory effort.  No retractions. Lungs CTAB. Musculoskeletal: No lower extremity tenderness nor edema.  No joint effusions. Neurologic:  Normal speech and language. No gross focal neurologic deficits are appreciated. No gait instability. Skin:  Skin is warm, dry and intact. No rash noted. Psychiatric: Mood and affect are normal. Speech and behavior are normal.  ____________________________________________   LABS (all labs ordered are listed, but only abnormal results are displayed)  Labs Reviewed - No data to display   RADIOLOGY  MRI lumbar spine: ____________________________________________   PROCEDURES  Procedure(s) performed: None  Critical Care performed: No  ____________________________________________   INITIAL IMPRESSION / ASSESSMENT AND PLAN / ED COURSE  Pertinent labs & imaging results that were available during my care of the patient were reviewed by me and considered in my medical decision making (see chart for details).  Low back pain. Patient here to rule out cauda equina but does not want to wait the 2 hours for an MRI. States that he would like to be discharged and go over to [facility] or to himself. Patient reports that he will have a driver taken to  [facility].  Patient voices no other emergency medical complaints at this time. ____________________________________________   FINAL CLINICAL IMPRESSION(S) / ED DIAGNOSES  Final diagnoses:  Midline low back pain with sciatica, sciatica laterality unspecified      Arlyss Repress, PA-C 07/04/15 1635  Joanne Gavel, MD 07/04/15 2235

## 2015-07-04 NOTE — Discharge Instructions (Signed)
You are to go [facility] or [facility] at your request for an MRI and evaluation Sciatica Sciatica is pain, weakness, numbness, or tingling along the path of the sciatic nerve. The nerve starts in the lower back and runs down the back of each leg. The nerve controls the muscles in the lower leg and in the back of the knee, while also providing sensation to the back of the thigh, lower leg, and the sole of your foot. Sciatica is a symptom of another medical condition. For instance, nerve damage or certain conditions, such as a herniated disk or bone spur on the spine, pinch or put pressure on the sciatic nerve. This causes the pain, weakness, or other sensations normally associated with sciatica. Generally, sciatica only affects one side of the body. CAUSES   Herniated or slipped disc.  Degenerative disk disease.  A pain disorder involving the narrow muscle in the buttocks (piriformis syndrome).  Pelvic injury or fracture.  Pregnancy.  Tumor (rare). SYMPTOMS  Symptoms can vary from mild to very severe. The symptoms usually travel from the low back to the buttocks and down the back of the leg. Symptoms can include:  Mild tingling or dull aches in the lower back, leg, or hip.  Numbness in the back of the calf or sole of the foot.  Burning sensations in the lower back, leg, or hip.  Sharp pains in the lower back, leg, or hip.  Leg weakness.  Severe back pain inhibiting movement. These symptoms may get worse with coughing, sneezing, laughing, or prolonged sitting or standing. Also, being overweight may worsen symptoms. DIAGNOSIS  Your caregiver will perform a physical exam to look for common symptoms of sciatica. He or she may ask you to do certain movements or activities that would trigger sciatic nerve pain. Other tests may be performed to find the cause of the sciatica. These may include:  Blood tests.  X-rays.  Imaging tests, such as an MRI or CT scan. TREATMENT    Treatment is directed at the cause of the sciatic pain. Sometimes, treatment is not necessary and the pain and discomfort goes away on its own. If treatment is needed, your caregiver may suggest:  Over-the-counter medicines to relieve pain.  Prescription medicines, such as anti-inflammatory medicine, muscle relaxants, or narcotics.  Applying heat or ice to the painful area.  Steroid injections to lessen pain, irritation, and inflammation around the nerve.  Reducing activity during periods of pain.  Exercising and stretching to strengthen your abdomen and improve flexibility of your spine. Your caregiver may suggest losing weight if the extra weight makes the back pain worse.  Physical therapy.  Surgery to eliminate what is pressing or pinching the nerve, such as a bone spur or part of a herniated disk. HOME CARE INSTRUCTIONS   Only take over-the-counter or prescription medicines for pain or discomfort as directed by your caregiver.  Apply ice to the affected area for 20 minutes, 3-4 times a day for the first 48-72 hours. Then try heat in the same way.  Exercise, stretch, or perform your usual activities if these do not aggravate your pain.  Attend physical therapy sessions as directed by your caregiver.  Keep all follow-up appointments as directed by your caregiver.  Do not wear high heels or shoes that do not provide proper support.  Check your mattress to see if it is too soft. A firm mattress may lessen your pain and discomfort. SEEK IMMEDIATE MEDICAL CARE IF:   You lose  control of your bowel or bladder (incontinence).  You have increasing weakness in the lower back, pelvis, buttocks, or legs.  You have redness or swelling of your back.  You have a burning sensation when you urinate.  You have pain that gets worse when you lie down or awakens you at night.  Your pain is worse than you have experienced in the past.  Your pain is lasting longer than 4 weeks.  You  are suddenly losing weight without reason. MAKE SURE YOU:  Understand these instructions.  Will watch your condition.  Will get help right away if you are not doing well or get worse. Document Released: 09/11/2001 Document Revised: 03/18/2012 Document Reviewed: 01/27/2012 [facility] Patient Information 2015 [place], [place]. This information is not intended to replace advice given to you by your health care provider. Make sure you discuss any questions you have with your health care provider.  of your back pain.

## 2015-10-28 ENCOUNTER — Emergency Department
Admission: EM | Admit: 2015-10-28 | Discharge: 2015-10-28 | Disposition: A | Discharge status: Home / Self Care | Payer: Self-pay | Attending: Emergency Medicine | Admitting: Emergency Medicine

## 2015-10-28 DIAGNOSIS — M545 Low back pain: Secondary | ICD-10-CM | POA: Insufficient documentation

## 2015-10-28 DIAGNOSIS — R202 Paresthesia of skin: Secondary | ICD-10-CM | POA: Insufficient documentation

## 2015-10-28 DIAGNOSIS — I10 Essential (primary) hypertension: Secondary | ICD-10-CM | POA: Insufficient documentation

## 2015-10-28 DIAGNOSIS — Z880 Allergy status to penicillin: Secondary | ICD-10-CM | POA: Insufficient documentation

## 2015-10-28 DIAGNOSIS — Z79899 Other long term (current) drug therapy: Secondary | ICD-10-CM | POA: Insufficient documentation

## 2015-10-28 DIAGNOSIS — G8929 Other chronic pain: Secondary | ICD-10-CM | POA: Insufficient documentation

## 2015-10-28 DIAGNOSIS — F17210 Nicotine dependence, cigarettes, uncomplicated: Secondary | ICD-10-CM | POA: Insufficient documentation

## 2015-10-28 MED ORDER — KETOROLAC TROMETHAMINE 60 MG/2ML IM SOLN
60.0000 mg | Freq: Once | INTRAMUSCULAR | Status: AC
  Administered 2015-10-28: 60 mg via INTRAMUSCULAR
  Filled 2015-10-28: qty 2

## 2015-10-28 MED ORDER — KETOROLAC TROMETHAMINE 10 MG PO TABS: 10.0000 mg | ORAL_TABLET | Freq: Four times a day (QID) | ORAL | Status: DC | PRN

## 2015-10-28 NOTE — Discharge Instructions (Signed)
You need to follow-up with Dr. Mack Guise who is an orthopedist for further evaluation of your chronic back pain. Toradol only as directed with food. Follow-up with your primary care doctor if any continued pain medication as needed and consider going to a pain clinic for further treatment of your back should Dr. Mack Guise advise it.

## 2015-10-28 NOTE — ED Notes (Signed)
Pt not in room for instructions   Will check lobby

## 2015-10-28 NOTE — ED Provider Notes (Signed)
[facility] Emergency Department Provider Note  ____________________________________________  Time seen: Approximately 8:38 AM  I have reviewed the triage vital signs and the nursing notes.   HISTORY  Chief Complaint Back Pain   HPI Ronald Lucas is a 55 y.o. male is here with complaint of low back pain starting yesterday. Patient states he has history of chronic back pain but currently does not see anyone for his back. He denies any urinary symptoms or history of kidney stones. There is been no nausea, vomiting or diarrhea. He denies any fever or chills. He states that the only medication that helps him is "Percocet". He denies any recent injury to his back and states pain radiates into his legs. He states that he was seen at [facility] were he had an MRI which did not answer his questions. He has not followed up with an orthopedist as he was supposed to. He denies any incontinence of bowel or bladder.Currently his primary care doctor hasn't taken Robaxin 500 mg 4 times a day and the last dose was last p.m. Currently he rates his pain at 10 over 10.   Past Medical History  Diagnosis Date  . Hypertension   . Panic attacks     There are no active problems to display for this patient.   Past Surgical History  Procedure Laterality Date  . Cardiac surgery    . Fracture surgery      Current Outpatient Rx  Name  Route  Sig  Dispense  Refill  . methocarbamol (ROBAXIN) 500 MG tablet   Oral   Take 500 mg by mouth 4 (four) times daily.         Marland Kitchen ketorolac (TORADOL) 10 MG tablet   Oral   Take 1 tablet (10 mg total) by mouth every 6 (six) hours as needed for moderate pain.   12 tablet   0     Allergies Buspar; Flexeril; Morphine and related; Penicillins; Prednisone; and Tetanus toxoids  No family history on file.  Social History Social History  Substance Use Topics  . Smoking status: Current Some Day Smoker    Types: Cigarettes  . Smokeless  tobacco: None  . Alcohol Use: No    Review of Systems Constitutional: No fever/chills Cardiovascular: Denies chest pain. Respiratory: Denies shortness of breath. Gastrointestinal: No abdominal pain.  No nausea, no vomiting.  No diarrhea.  No constipation. Genitourinary: Negative for dysuria. Musculoskeletal: Positive for chronic back pain. Skin: Negative for rash. Neurological: Negative for headaches, focal weakness. Reported Paresthesias bilateral legs.  10-point ROS otherwise negative.  ____________________________________________   PHYSICAL EXAM:  VITAL SIGNS: ED Triage Vitals  Enc Vitals Group     BP 10/28/15 0825 184/112 mmHg     Pulse Rate 10/28/15 0824 92     Resp 10/28/15 0824 20     Temp 10/28/15 0824 97.8 F (36.6 C)     Temp Source 10/28/15 0824 Oral     SpO2 10/28/15 0824 97 %     Weight 10/28/15 0824 300 lb (136.079 kg)     Height 10/28/15 0824 6' (1.829 m)     Head Cir --      Peak Flow --      Pain Score 10/28/15 0824 10     Pain Loc --      Pain Edu? --      Excl. in [place]? --     Constitutional: Alert and oriented. On entry into the examining patient is lying on his  stomach with poor eye contact and reluctant to answer questions. He states that the Robaxin is "not good" and is complaining of this medicine which was not given to him in the emergency room. Eyes: Conjunctivae are normal. PERRL. EOMI. Head: Atraumatic. Nose: No congestion/rhinnorhea. Neck: No stridor.   Cardiovascular: Normal rate, regular rhythm. Grossly normal heart sounds.  Good peripheral circulation. Respiratory: Normal respiratory effort.  No retractions. Lungs CTAB. Gastrointestinal: Soft and nontender. No distention. Musculoskeletal: Examination back feels show any gross abnormality. There is moderate tenderness on palpation of the sacrum and paravertebral muscles. Patient refuses to get from his stomach to a sitting position for further examination and makes little eye contact.  Patient was noted to walk without any assistance or difficulty to the restroom while in the emergency room. Neurologic:  Normal speech and language. No gross focal neurologic deficits are appreciated. No gait instability. Skin:  Skin is warm, dry and intact. No rash noted. Psychiatric: Mood and affect are normal. Speech and behavior are normal.  ____________________________________________   LABS (all labs ordered are listed, but only abnormal results are displayed)  Labs Reviewed - No data to display  PROCEDURES  Procedure(s) performed: None  Critical Care performed: No  ____________________________________________   INITIAL IMPRESSION / ASSESSMENT AND PLAN / ED COURSE  Pertinent labs & imaging results that were available during my care of the patient were reviewed by me and considered in my medical decision making (see chart for details).  Records from previous visits elsewhere was reviewed and MRI did not show any emergent findings but degenerative disc disease. It was documented that patient was to follow up with an orthopedist. Patient continued to be disgruntled over the fact that of how he feels with Robaxin despite the fact that it was his primary care doctor who prescribed the medication he was not given this in the emergency room. Patient had multiple allergies and was given a Toradol injection while in the emergency room along with a prescription for Toradol 10 mg. Patient left the room but returned still disgruntled about his back pain. He also went into the lobby and argued with the nurse and charge nurse about his follow-up care and his prescription. Patient was told that we do not use narcotics for chronic back pain and that he should follow up with a orthopedist and also encouraged him to consider with the pain clinic. ____________________________________________   FINAL CLINICAL IMPRESSION(S) / ED DIAGNOSES  Final diagnoses:  Chronic low back pain      Johnn Hai, PA-C 10/28/15 [place], MD 10/28/15 1340

## 2015-10-28 NOTE — ED Notes (Signed)
States he has chronic mid to lower back pain .Marland Kitchen Was placed on robaxin by his PMD ..states he is not able to take d/t n/v  Only med that helps is percocet.Deneis recent injury and states pain radiates into legs

## 2015-10-28 NOTE — ED Notes (Signed)
Pt c/o mid and lower back pain since yesterday, denies injury.. States he has a hx of back pain.Ronald Lucas

## 2016-01-18 DIAGNOSIS — G90521 Complex regional pain syndrome I of right lower limb: Secondary | ICD-10-CM | POA: Insufficient documentation

## 2016-01-18 DIAGNOSIS — M720 Palmar fascial fibromatosis [Dupuytren]: Secondary | ICD-10-CM | POA: Insufficient documentation

## 2016-03-27 ENCOUNTER — Emergency Department
Admission: EM | Admit: 2016-03-27 | Discharge: 2016-03-27 | Disposition: A | Discharge status: Home / Self Care | Payer: Medicaid Other | Attending: Emergency Medicine | Admitting: Emergency Medicine

## 2016-03-27 DIAGNOSIS — Z79899 Other long term (current) drug therapy: Secondary | ICD-10-CM | POA: Diagnosis not present

## 2016-03-27 DIAGNOSIS — F17210 Nicotine dependence, cigarettes, uncomplicated: Secondary | ICD-10-CM | POA: Diagnosis not present

## 2016-03-27 DIAGNOSIS — M5126 Other intervertebral disc displacement, lumbar region: Secondary | ICD-10-CM | POA: Diagnosis not present

## 2016-03-27 DIAGNOSIS — G8929 Other chronic pain: Secondary | ICD-10-CM | POA: Insufficient documentation

## 2016-03-27 DIAGNOSIS — I10 Essential (primary) hypertension: Secondary | ICD-10-CM | POA: Insufficient documentation

## 2016-03-27 DIAGNOSIS — M5136 Other intervertebral disc degeneration, lumbar region: Secondary | ICD-10-CM

## 2016-03-27 DIAGNOSIS — M545 Low back pain: Secondary | ICD-10-CM | POA: Diagnosis present

## 2016-03-27 DIAGNOSIS — M549 Dorsalgia, unspecified: Secondary | ICD-10-CM

## 2016-03-27 MED ORDER — GABAPENTIN 300 MG PO CAPS: 300.0000 mg | ORAL_CAPSULE | Freq: Three times a day (TID) | ORAL | Status: DC

## 2016-03-27 MED ORDER — ORPHENADRINE CITRATE ER 100 MG PO TB12: 100.0000 mg | ORAL_TABLET | Freq: Two times a day (BID) | ORAL | Status: DC | PRN

## 2016-03-27 MED ORDER — NABUMETONE 750 MG PO TABS: 750.0000 mg | ORAL_TABLET | Freq: Two times a day (BID) | ORAL | Status: DC

## 2016-03-27 MED ORDER — GABAPENTIN 600 MG PO TABS
300.0000 mg | ORAL_TABLET | Freq: Once | ORAL | Status: AC
  Administered 2016-03-27: 300 mg via ORAL
  Filled 2016-03-27: qty 1

## 2016-03-27 MED ORDER — ORPHENADRINE CITRATE 30 MG/ML IJ SOLN
60.0000 mg | INTRAMUSCULAR | Status: AC
  Administered 2016-03-27: 60 mg via INTRAMUSCULAR
  Filled 2016-03-27: qty 2

## 2016-03-27 NOTE — ED Notes (Signed)
Pt c/o lower back pain for the past 2 weeks.. Denies injury.Marland Kitchen

## 2016-03-27 NOTE — Discharge Instructions (Signed)
Your exam reveals chronic lumbar pain due to bulging disc and spinal stenosis. You should follow-up with pain management for further evaluation.   Chronic Pain Chronic pain can be defined as pain that is off and on and lasts for 3-6 months or longer. Many things cause chronic pain, which can make it difficult to make a diagnosis. There are many treatment options available for chronic pain. However, finding a treatment that works well for you may require trying various approaches until the right one is found. Many people benefit from a combination of two or more types of treatment to control their pain. SYMPTOMS  Chronic pain can occur anywhere in the body and can range from mild to very severe. Some types of chronic pain include:  Headache.  Low back pain.  Cancer pain.  Arthritis pain.  Neurogenic pain. This is pain resulting from damage to nerves. People with chronic pain may also have other symptoms such as:  Depression.  Anger.  Insomnia.  Anxiety. DIAGNOSIS  Your health care provider will help diagnose your condition over time. In many cases, the initial focus will be on excluding possible conditions that could be causing the pain. Depending on your symptoms, your health care provider may order tests to diagnose your condition. Some of these tests may include:   Blood tests.   CT scan.   MRI.   X-rays.   Ultrasounds.   Nerve conduction studies.  You may need to see a specialist.  TREATMENT  Finding treatment that works well may take time. You may be referred to a pain specialist. He or she may prescribe medicine or therapies, such as:   Mindful meditation or yoga.  Shots (injections) of numbing or pain-relieving medicines into the spine or area of pain.  Local electrical stimulation.  Acupuncture.   Massage therapy.   Aroma, color, light, or sound therapy.   Biofeedback.   Working with a physical therapist to keep from getting stiff.    Regular, gentle exercise.   Cognitive or behavioral therapy.   Group support.  Sometimes, surgery may be recommended.  HOME CARE INSTRUCTIONS   Take all medicines as directed by your health care provider.   Lessen stress in your life by relaxing and doing things such as listening to calming music.   Exercise or be active as directed by your health care provider.   Eat a healthy diet and include things such as vegetables, fruits, fish, and lean meats in your diet.   Keep all follow-up appointments with your health care provider.   Attend a support group with others suffering from chronic pain. SEEK MEDICAL CARE IF:   Your pain gets worse.   You develop a new pain that was not there before.   You cannot tolerate medicines given to you by your health care provider.   You have new symptoms since your last visit with your health care provider.  SEEK IMMEDIATE MEDICAL CARE IF:   You feel weak.   You have decreased sensation or numbness.   You lose control of bowel or bladder function.   Your pain suddenly gets much worse.   You develop shaking.  You develop chills.  You develop confusion.  You develop chest pain.  You develop shortness of breath.  MAKE SURE YOU:  Understand these instructions.  Will watch your condition.  Will get help right away if you are not doing well or get worse.   This information is not intended to replace advice given to  you by your health care provider. Make sure you discuss any questions you have with your health care provider.   Document Released: 06/09/2002 Document Revised: 05/20/2013 Document Reviewed: 03/13/2013 Elsevier Interactive Patient Education 2016 Else[employer]  Back Pain, Adult Back pain is very common in adults.The cause of back pain is rarely dangerous and the pain often gets better over time.The cause of your back pain may not be known. Some common causes of back pain include:  Strain of the  muscles or ligaments supporting the spine.  Wear and tear (degeneration) of the spinal disks.  Arthritis.  Direct injury to the back. For many people, back pain may return. Since back pain is rarely dangerous, most people can learn to manage this condition on their own. HOME CARE INSTRUCTIONS Watch your back pain for any changes. The following actions may help to lessen any discomfort you are feeling:  Remain active. It is stressful on your back to sit or stand in one place for long periods of time. Do not sit, drive, or stand in one place for more than 30 minutes at a time. Take short walks on even surfaces as soon as you are able.Try to increase the length of time you walk each day.  Exercise regularly as directed by your health care provider. Exercise helps your back heal faster. It also helps avoid future injury by keeping your muscles strong and flexible.  Do not stay in bed.Resting more than 1-2 days can delay your recovery.  Pay attention to your body when you bend and lift. The most comfortable positions are those that put less stress on your recovering back. Always use proper lifting techniques, including:  Bending your knees.  Keeping the load close to your body.  Avoiding twisting.  Find a comfortable position to sleep. Use a firm mattress and lie on your side with your knees slightly bent. If you lie on your back, put a pillow under your knees.  Avoid feeling anxious or stressed.Stress increases muscle tension and can worsen back pain.It is important to recognize when you are anxious or stressed and learn ways to manage it, such as with exercise.  Take medicines only as directed by your health care provider. Over-the-counter medicines to reduce pain and inflammation are often the most helpful.Your health care provider may prescribe muscle relaxant drugs.These medicines help dull your pain so you can more quickly return to your normal activities and healthy  exercise.  Apply ice to the injured area:  Put ice in a plastic bag.  Place a towel between your skin and the bag.  Leave the ice on for 20 minutes, 2-3 times a day for the first 2-3 days. After that, ice and heat may be alternated to reduce pain and spasms.  Maintain a healthy weight. Excess weight puts extra stress on your back and makes it difficult to maintain good posture. SEEK MEDICAL CARE IF:  You have pain that is not relieved with rest or medicine.  You have increasing pain going down into the legs or buttocks.  You have pain that does not improve in one week.  You have night pain.  You lose weight.  You have a fever or chills. SEEK IMMEDIATE MEDICAL CARE IF:   You develop new bowel or bladder control problems.  You have unusual weakness or numbness in your arms or legs.  You develop nausea or vomiting.  You develop abdominal pain.  You feel faint.   This information is not intended to replace  advice given to you by your health care provider. Make sure you discuss any questions you have with your health care provider.   Document Released: 09/17/2005 Document Revised: 10/08/2014 Document Reviewed: 01/19/2014 Elsevier Interactive Patient Education 2016 Elsevier Inc.  Neuropathic Pain Neuropathic pain is pain caused by damage to the nerves that are responsible for certain sensations in your body (sensory nerves). The pain can be caused by damage to:   The sensory nerves that send signals to your spinal cord and brain (peripheral nervous system).  The sensory nerves in your brain or spinal cord (central nervous system). Neuropathic pain can make you more sensitive to pain. What would be a minor sensation for most people may feel very painful if you have neuropathic pain. This is usually a long-term condition that can be difficult to treat. The type of pain can differ from person to person. It may start suddenly (acute), or it may develop slowly and last for a long  time (chronic). Neuropathic pain may come and go as damaged nerves heal or may stay at the same level for years. It often causes emotional distress, loss of sleep, and a lower quality of life. CAUSES  The most common cause of damage to a sensory nerve is diabetes. Many other diseases and conditions can also cause neuropathic pain. Causes of neuropathic pain can be classified as:  Toxic. Many drugs and chemicals can cause toxic damage. The most common cause of toxic neuropathic pain is damage from drug treatment for cancer (chemotherapy).  Metabolic. This type of pain can happen when a disease causes imbalances that damage nerves. Diabetes is the most common of these diseases. Vitamin B deficiency caused by long-term alcohol abuse is another common cause.  Traumatic. Any injury that cuts, crushes, or stretches a nerve can cause damage and pain. A common example is feeling pain after losing an arm or leg (phantom limb pain).  Compression-related. If a sensory nerve gets trapped or compressed for a long period of time, the blood supply to the nerve can be cut off.  Vascular. Many blood vessel diseases can cause neuropathic pain by decreasing blood supply and oxygen to nerves.  Autoimmune. This type of pain results from diseases in which the body's defense system mistakenly attacks sensory nerves. Examples of autoimmune diseases that can cause neuropathic pain include lupus and multiple sclerosis.  Infectious. Many types of viral infections can damage sensory nerves and cause pain. Shingles infection is a common cause of this type of pain.  Inherited. Neuropathic pain can be a symptom of many diseases that are passed down through families (genetic). SIGNS AND SYMPTOMS  The main symptom is pain. Neuropathic pain is often described as:  Burning.  Shock-like.  Stinging.  Hot or cold.  Itching. DIAGNOSIS  No single test can diagnose neuropathic pain. Your health care provider will do a  physical exam and ask you about your pain. You may use a pain scale to describe how bad your pain is. You may also have tests to see if you have a high sensitivity to pain and to help find the cause and location of any sensory nerve damage. These tests may include:  Imaging studies, such as:  X-rays.  CT scan.  MRI.  Nerve conduction studies to test how well nerve signals travel through your sensory nerves (electrodiagnostic testing).  Stimulating your sensory nerves through electrodes on your skin and measuring the response in your spinal cord and brain (somatosensory evoked potentials). TREATMENT  Treatment for  neuropathic pain may change over time. You may need to try different treatment options or a combination of treatments. Some options include:  Over-the-counter pain relievers.  Prescription medicines. Some medicines used to treat other conditions may also help neuropathic pain. These include medicines to:  Control seizures (anticonvulsants).  Relieve depression (antidepressants).  Prescription-strength pain relievers (narcotics). These are usually used when other pain relievers do not help.  Transcutaneous nerve stimulation (TENS). This uses electrical currents to block painful nerve signals. The treatment is painless.  Topical and local anesthetics. These are medicines that numb the nerves. They can be injected as a nerve block or applied to the skin.  Alternative treatments, such as:  Acupuncture.  Meditation.  Massage.  Physical therapy.  Pain management programs.  Counseling. HOME CARE INSTRUCTIONS  Learn as much as you can about your condition.  Take medicines only as directed by your health care provider.  Work closely with all your health care providers to find what works best for you.  Have a good support system at home.  Consider joining a chronic pain support group. SEEK MEDICAL CARE IF:  Your pain treatments are not helping.  You are having  side effects from your medicines.  You are struggling with fatigue, mood changes, depression, or anxiety.   This information is not intended to replace advice given to you by your health care provider. Make sure you discuss any questions you have with your health care provider.   Document Released: 06/14/2004 Document Revised: 10/08/2014 Document Reviewed: 02/25/2014 Elsevier Interactive Patient Education [employer].

## 2016-03-27 NOTE — ED Notes (Signed)
Patient left without discharge papers. PA is aware.

## 2016-03-27 NOTE — ED Notes (Signed)
Patient was belligerent, swearing before medication was administered and refused to stop when asked. Patient continue to swear after the medication was administered.

## 2016-03-27 NOTE — ED Notes (Signed)
Patient walked back into department. Discharge instructions were given.

## 2016-03-27 NOTE — ED Notes (Signed)
E-signature pad did not work. Patient had ride outside and wanted a quick discharge.

## 2016-03-27 NOTE — ED Provider Notes (Signed)
[facility] Emergency Department Provider Note ____________________________________________  Time seen: 1837  I have reviewed the triage vital signs and the nursing notes.  HISTORY  Chief Complaint  Back Pain  HPI Ronald Lucas is a 55 y.o. male visits to the ED with extensive history of low back pain due to underlying chronic, stenosis and bulging disc at the levels L3-4 and L5-S1. The patient is adamant upon being roomed that he does not want to see a advance practice provider. He has been seen here before for similar complaints of chronic low back pain. He reports in the interim he has received an MRI and was subsequently referred to both orthopedics and neurosurgery. He reports that the neurosurgeon quickly referred him to pain management in [place]. He apparently was unable to secure an appointment with pain management due to his insurance coverage. He stated today reporting severe pain to the back with little benefit from his previous medication that included Robaxin andetodolac. He is also verbalizing that he does not want ketorolac as he was told by a pharmacist that one cannot have more than 100 mg of Toradol per year. Patient denies any interim bladder or bowel incontinence. Reports pain is persistent daily leads him mostly in a prone position do to comfort. He has not followed up with his primary care provider recently. He describes the pain over the last 48 hours then more severely usual. He denies any extremity injury, fall, trauma, or accident. He also denies any recent illness, increased urination, or retention. He reports his discomfort at 10/10 in triage.  Past Medical History  Diagnosis Date  . Hypertension   . Panic attacks     There are no active problems to display for this patient.   Past Surgical History  Procedure Laterality Date  . Cardiac surgery    . Fracture surgery      Current Outpatient Rx  Name  Route  Sig  Dispense  Refill   . gabapentin (NEURONTIN) 300 MG capsule   Oral   Take 1 capsule (300 mg total) by mouth 3 (three) times daily.   60 capsule   0   . ketorolac (TORADOL) 10 MG tablet   Oral   Take 1 tablet (10 mg total) by mouth every 6 (six) hours as needed for moderate pain.   12 tablet   0   . methocarbamol (ROBAXIN) 500 MG tablet   Oral   Take 500 mg by mouth 4 (four) times daily.         . nabumetone (RELAFEN) 750 MG tablet   Oral   Take 1 tablet (750 mg total) by mouth 2 (two) times daily.   30 tablet   0   . orphenadrine (NORFLEX) 100 MG tablet   Oral   Take 1 tablet (100 mg total) by mouth 2 (two) times daily as needed for muscle spasms.   30 tablet   0    Allergies Buspar; Flexeril; Morphine and related; Penicillins; Prednisone; and Tetanus toxoids  No family history on file.  Social History Social History  Substance Use Topics  . Smoking status: Current Some Day Smoker    Types: Cigarettes  . Smokeless tobacco: None  . Alcohol Use: No    Review of Systems  Constitutional: Negative for fever. Cardiovascular: Negative for chest pain. Respiratory: Negative for shortness of breath. Gastrointestinal: Negative for abdominal pain, vomiting and diarrhea. Genitourinary: Negative for dysuria. Musculoskeletal: Positive for back pain. Skin: Negative for rash. Neurological: Negative  for headaches, focal weakness or numbness. ____________________________________________  PHYSICAL EXAM:  VITAL SIGNS: ED Triage Vitals  Enc Vitals Group     BP 03/27/16 1737 173/104 mmHg     Pulse Rate 03/27/16 1737 117     Resp 03/27/16 1737 22     Temp 03/27/16 1737 98 F (36.7 C)     Temp Source 03/27/16 1737 Oral     SpO2 03/27/16 1737 98 %     Weight 03/27/16 1737 300 lb (136.079 kg)     Height 03/27/16 1737 6' (1.829 m)     Head Cir --      Peak Flow --      Pain Score 03/27/16 1741 10     Pain Loc --      Pain Edu? --      Excl. in [place]? --    Constitutional: Alert and  oriented. Well appearing and in no distress. Patient in the room and a prone position during interview. Head: Normocephalic and atraumatic. Cardiovascular: Normal rate, regular rhythm.  Respiratory: Normal respiratory effort. No wheezes/rales/rhonchi. Gastrointestinal: Soft and nontender. No distention. Musculoskeletal: Normal spinal on it without midline tenderness, spasm, deformity, or step-off. Patient is able to transition from a prone to a right lateral decubitus position. Nontender with normal range of motion in all extremities.  Neurologic: Cranial nerves II through XII grossly intact. Normal LE DTRs bilaterally. Normal gait without ataxia. Normal speech and language. No gross focal neurologic deficits are appreciated. Skin:  Skin is warm, dry and intact. No rash noted. ____________________________________________   RADIOLOGY  Not indicated. ____________________________________________  PROCEDURES  Norflex 60 mg IM Gabapentin 300 mg PO ____________________________________________  INITIAL IMPRESSION / ASSESSMENT AND PLAN / ED COURSE  Patient had ambulatory following administration of these Norflex injection. His actually witnessed by me leaving the room and entering into the lobby. The patient returns about 10 minutes later to his room. He is subsequently discharged with prescription for Norflex, Relafen, and Neurontin. He is advised to follow up with his primary care provider is referred to the  pain management for further evaluation and management. ____________________________________________  FINAL CLINICAL IMPRESSION(S) / ED DIAGNOSES  Final diagnoses:  Chronic back pain  Bulging lumbar disc     Melvenia Needles, PA-C 03/28/16 0014  Lisa Roca, MD 03/28/16 1610

## 2016-03-27 NOTE — ED Notes (Signed)
Voice mail was left that discharge instructions were at the front desk.

## 2016-03-27 NOTE — ED Notes (Signed)
Patient states he does not want to see a PA, but wants to see a MD.

## 2016-03-29 ENCOUNTER — Telehealth: Payer: Self-pay | Admitting: *Deleted

## 2016-03-29 NOTE — Telephone Encounter (Signed)
sw pt made him aware that we did receive his referral and that Dr. Dossie Arbour reviewed and then declined. Patient asked  Why? I responded that I was not sure but he can follow up with his referring doctor to find out the reason....td

## 2016-12-28 ENCOUNTER — Emergency Department
Admission: EM | Admit: 2016-12-28 | Discharge: 2016-12-28 | Disposition: A | Discharge status: Home / Self Care | Payer: Medicaid Other | Attending: Emergency Medicine | Admitting: Emergency Medicine

## 2016-12-28 ENCOUNTER — Encounter: Payer: Self-pay | Admitting: Emergency Medicine

## 2016-12-28 ENCOUNTER — Emergency Department: Payer: Medicaid Other

## 2016-12-28 DIAGNOSIS — K047 Periapical abscess without sinus: Secondary | ICD-10-CM | POA: Insufficient documentation

## 2016-12-28 DIAGNOSIS — F17210 Nicotine dependence, cigarettes, uncomplicated: Secondary | ICD-10-CM | POA: Diagnosis not present

## 2016-12-28 DIAGNOSIS — J45909 Unspecified asthma, uncomplicated: Secondary | ICD-10-CM | POA: Diagnosis not present

## 2016-12-28 DIAGNOSIS — I10 Essential (primary) hypertension: Secondary | ICD-10-CM | POA: Diagnosis not present

## 2016-12-28 DIAGNOSIS — K0889 Other specified disorders of teeth and supporting structures: Secondary | ICD-10-CM | POA: Diagnosis present

## 2016-12-28 HISTORY — DX: Unspecified asthma, uncomplicated: J45.909

## 2016-12-28 LAB — COMPREHENSIVE METABOLIC PANEL
ALT: 27 U/L (ref 17–63)
AST: 27 U/L (ref 15–41)
Albumin: 4.2 g/dL (ref 3.5–5.0)
Alkaline Phosphatase: 49 U/L (ref 38–126)
Anion gap: 5 (ref 5–15)
BUN: 14 mg/dL (ref 6–20)
CO2: 24 mmol/L (ref 22–32)
Calcium: 8.9 mg/dL (ref 8.9–10.3)
Chloride: 105 mmol/L (ref 101–111)
Creatinine, Ser: 1.15 mg/dL (ref 0.61–1.24)
GFR calc Af Amer: >60 mL/min (ref >60)
GFR calc non Af Amer: >60 mL/min (ref >60)
Glucose, Bld: 97 mg/dL (ref 65–99)
Potassium: 4.7 mmol/L (ref 3.5–5.1)
Sodium: 134 mmol/L — ABNORMAL LOW (ref 135–145)
Total Bilirubin: 0.6 mg/dL (ref 0.3–1.2)
Total Protein: 7.3 g/dL (ref 6.5–8.1)

## 2016-12-28 LAB — CBC
HCT: 43.4 % (ref 40.0–52.0)
Hemoglobin: 15.4 g/dL (ref 13.0–18.0)
MCH: 30.5 pg (ref 26.0–34.0)
MCHC: 35.4 g/dL (ref 32.0–36.0)
MCV: 86 fL (ref 80.0–100.0)
Platelets: 206 10*3/uL (ref 150–440)
RBC: 5.05 MIL/uL (ref 4.40–5.90)
RDW: 13.2 % (ref 11.5–14.5)
WBC: 8.2 10*3/uL (ref 3.8–10.6)

## 2016-12-28 MED ORDER — CLINDAMYCIN PHOSPHATE 600 MG/50ML IV SOLN
600.0000 mg | Freq: Once | INTRAVENOUS | Status: AC
  Administered 2016-12-28: 600 mg via INTRAVENOUS
  Filled 2016-12-28: qty 50

## 2016-12-28 MED ORDER — HYDROCODONE-ACETAMINOPHEN 5-325 MG PO TABS: 1.0000 | ORAL_TABLET | Freq: Four times a day (QID) | ORAL | 0 refills | Status: DC | PRN

## 2016-12-28 MED ORDER — HYDROCODONE-ACETAMINOPHEN 5-325 MG PO TABS
1.0000 | ORAL_TABLET | Freq: Once | ORAL | Status: AC
  Administered 2016-12-28: 1 via ORAL
  Filled 2016-12-28: qty 1

## 2016-12-28 MED ORDER — AMOXICILLIN 500 MG PO CAPS: 500.0000 mg | ORAL_CAPSULE | Freq: Three times a day (TID) | ORAL | 0 refills | Status: DC

## 2016-12-28 MED ORDER — IOPAMIDOL (ISOVUE-300) INJECTION 61%
75.0000 mL | Freq: Once | INTRAVENOUS | Status: AC | PRN
  Administered 2016-12-28: 75 mL via INTRAVENOUS
  Filled 2016-12-28: qty 75

## 2016-12-28 NOTE — Discharge Instructions (Signed)
OPTIONS FOR DENTAL FOLLOW UP CARE ° °[place] Department of Health and Human Services - Local Safety Net Dental Clinics °http://www.ncdhhs.gov/dph/oralhealth/services/safetynetclinics.htm °  °[facility] (336-562-3123) ° °[facility] [place] (919-933-9087) ° °[facility] (919-663-1744 ext [phone]) ° °[facility] ([phone]) ° °[facility] ([phone]) °This clinic caters to the indigent population and is on a lottery system. °Location: °[facility], [address], [place] °Clinic Hours: °Wednesdays from 6pm - 9pm, patients seen by a lottery system. °For dates, call or go to [email or web address].[email or web address]/[facility]/patients/[email or web address] °Services: °Cleanings, fillings and simple extractions. °Payment Options: °DENTAL WORK IS FREE OF CHARGE. Bring proof of income or support. °Best way to get seen: °Arrive at 5:15 pm - this is a lottery, NOT first come/first serve, so arriving earlier will not increase your chances of being seen. °  °  °[facility] °[phone] °Select option 1 for emergencies °  °Location: °[facility], [address], [place] °Clinic Hours: °No walk-ins accepted - call the day before to schedule an appointment. °Check in times are 9:30 am and 1:30 pm. °Services: °Simple extractions, temporary fillings, pulpectomy/pulp debridement, uncomplicated abscess drainage. °Payment Options: °PAYMENT IS DUE AT THE TIME OF SERVICE.  Fee is usually $100-200, additional surgical procedures (e.g. abscess drainage) may be extra. °Cash, checks, Visa/MasterCard accepted.  Can file Medicaid if patient is covered for dental - patient should call case worker to check. °No discount for [facility] patients. °Best way to get seen: °MUST call the day before and get onto the schedule. Can usually be seen the next 1-2 days. No walk-ins accepted. °  °  °[facility] °[phone] °   °Location: °[facility], [address], [place] °Clinic Hours: °M, W, Th, F 8am or 1:30pm, Tues 9a or 1:30 - first come/first served. °Services: °Simple extractions, temporary fillings, uncomplicated abscess drainage.  You do not need to be an [place] resident. °Payment Options: °PAYMENT IS DUE AT THE TIME OF SERVICE. °Dental insurance, otherwise sliding scale - bring proof of income or support. °Depending on income and treatment needed, cost is usually $50-200. °Best way to get seen: °Arrive early as it is first come/first served. °  °  °[facility] °[phone] °  °Location: °[address] °Clinic Hours: °Mon-Thu 8a-5p °Services: °Most basic dental services including extractions and fillings. °Payment Options: °PAYMENT IS DUE AT THE TIME OF SERVICE. °Sliding scale, up to 50% off - bring proof if income or support. °Medicaid with dental option accepted. °Best way to get seen: °Call to schedule an appointment, can usually be seen within 2 weeks OR they will try to see walk-ins - show up at 8a or 2p (you may have to wait). °  °  °[facility] °[phone] °[place] RESIDENTS ONLY °  °Location: °[facility], [address], [place], [place] [address] °Clinic Hours: By appointment only. °Monday - Thursday 8am-5pm, Friday 8am-12pm °Services: Cleanings, fillings, extractions. °Payment Options: °PAYMENT IS DUE AT THE TIME OF SERVICE. °Cash, Visa or MasterCard. Sliding scale - $30 minimum per service. °Best way to get seen: °Come in to office, complete packet and make an appointment - need proof of income °or support monies for each household member and proof of [place] residence. °Usually takes about a month to get in. °  °  °[facility] °[phone] °  °Location: °[address]   [place] °Clinic Hours: Walk-in Urgent Care Dental Services are offered Monday-Friday  mornings only. °The numbers of emergencies accepted daily is limited to the number of °providers available. °Maximum 15 - Mondays, Wednesdays & Thursdays °Maximum 10 - Tuesdays & Fridays °Services: °You do not need to be a [place] resident to be seen for a dental emergency. °Emergencies are defined as pain, swelling, abnormal bleeding, or dental trauma. Walkins will receive x-rays if needed. °NOTE: Dental cleaning is not an emergency. °Payment Options: °PAYMENT IS DUE AT THE TIME OF SERVICE. °Minimum co-pay is $40.00 for uninsured patients. °Minimum co-pay is $3.00 for Medicaid with dental coverage. °Dental Insurance is accepted and must be presented at time of visit. °Medicare does not cover dental. °Forms of payment: Cash, credit card, checks. °Best way to get seen: °If not previously registered with the clinic, walk-in dental registration begins at 7:15 am and is on a first come/first serve basis. °If previously registered with the clinic, call to make an appointment. °  °  °[facility] °[phone] °[place] RESIDENTS ONLY °  °Location: °[address], [place], [place] °Clinic Hours: °Mon-Thu 10a-2p °Services: Extractions only! °Payment Options: °FREE (donations accepted) - bring proof of income or support °Best way to get seen: °Call and schedule an appointment OR come at 8am on the 1st Monday of every month (except for holidays) when it is first come/first served. °  °  °[facility] °[phone] °  °Location: °[address], [place] °Clinic Hours: °Friday mornings °Services, Payment Options, Best way to get seen: °Call for info °

## 2016-12-28 NOTE — ED Notes (Signed)
Patient states LL dental pain for 1 week and chronic back pain.

## 2016-12-28 NOTE — ED Triage Notes (Signed)
Lower L dental pain x 2 days. Lower back pain x 5 years.

## 2016-12-28 NOTE — ED Provider Notes (Signed)
[facility] Emergency Department Provider Note  ____________________________________________  Time seen: Approximately 4:02 PM  I have reviewed the triage vital signs and the nursing notes.   HISTORY  Chief Complaint Dental Pain and Back Pain    HPI Ronald Lucas is a 56 y.o. male that presents to the emergency department with lower left dental pain and chronic back pain. Pain in his lower tooth began about a week ago. He states that cheek and neck have been swelling for a couple of days. He is having difficulty opening and closing mouth. He came to the ED today because he cannot take the pain from his mouth and his back at the same time anymore. Back pain has not changed in character. Patient is upset that he cannot afford to go to a specialist. He has seen orthopedics at [facility] and has been told that he needs to see a neurosurgeon.  He cannot afford the $500 it takes to go there. He states that he has not been able to see a dentist since he lost his job in 2012. He states that he avoids the dentist because when he was a child his "dentist was a sadist." Now if he needs to go to the dentist he needs to be sedated. Patient states that he can take amoxicillin without difficulty. 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   Past Medical History:  Diagnosis Date  . Asthma   . Hypertension   . Panic attacks     There are no active problems to display for this patient.   Past Surgical History:  Procedure Laterality Date  . CARDIAC SURGERY    . FRACTURE SURGERY      Prior to Admission medications   Medication Sig Start Date End Date Taking? Authorizing Provider  amoxicillin (AMOXIL) 500 MG capsule Take 1 capsule (500 mg total) by mouth 3 (three) times daily. 12/28/16   Laban Emperor, PA-C  gabapentin  (NEURONTIN) 300 MG capsule Take 1 capsule (300 mg total) by mouth 3 (three) times daily. 03/27/16 03/27/17  Jenise V Bacon Menshew, PA-C  HYDROcodone-acetaminophen (NORCO/VICODIN) 5-325 MG tablet Take 1 tablet by mouth every 6 (six) hours as needed for moderate pain. 12/28/16   Laban Emperor, PA-C  ketorolac (TORADOL) 10 MG tablet Take 1 tablet (10 mg total) by mouth every 6 (six) hours as needed for moderate pain. 10/28/15   Johnn Hai, PA-C  methocarbamol (ROBAXIN) 500 MG tablet Take 500 mg by mouth 4 (four) times daily.    Historical Provider, MD  nabumetone (RELAFEN) 750 MG tablet Take 1 tablet (750 mg total) by mouth 2 (two) times daily. 03/27/16   Jenise V Bacon Menshew, PA-C  orphenadrine (NORFLEX) 100 MG tablet Take 1 tablet (100 mg total) by mouth 2 (two) times daily as needed for muscle spasms. 03/27/16   Jenise V Bacon Menshew, PA-C    Allergies Buspar [buspirone]; Flexeril [cyclobenzaprine]; Morphine and related; Penicillins; Prednisone; and Tetanus toxoids  No family history on file.  Social History Social History  Substance Use Topics  . Smoking status: Current Some Day Smoker    Types: Cigarettes  . Smokeless tobacco: Not on file  . Alcohol use No     Review of Systems  Constitutional: No fever/chills ENT: No upper respiratory complaints. Cardiovascular: No chest pain. Respiratory: No cough. No SOB. Gastrointestinal: No abdominal pain.  No nausea, no vomiting.  Musculoskeletal: Positive for back pain. Skin: Negative for rash, abrasions, lacerations, ecchymosis. Neurological: Negative for headaches, numbness or tingling   ____________________________________________   PHYSICAL EXAM:  VITAL SIGNS: ED Triage Vitals  Enc Vitals Group     BP 12/28/16 1431 (!) 191/94     Pulse Rate 12/28/16 1431 96     Resp 12/28/16 1431 20     Temp 12/28/16 1431 98.4 F (36.9 C)     Temp Source 12/28/16 1431 Oral     SpO2 12/28/16 1431 96 %     Weight 12/28/16 1433 300 lb  (136.1 kg)     Height 12/28/16 1433 5\' 11"  (1.803 m)     Head Circumference --      Peak Flow --      Pain Score 12/28/16 1431 10     Pain Loc --      Pain Edu? --      Excl. in [place]? --      Constitutional: Alert and oriented. Agitated. Eyes: Conjunctivae are normal. PERRL. EOMI. Head: Atraumatic. ENT:      Ears:      Nose: No congestion/rhinnorhea.      Mouth/Throat: Mucous membranes are moist. Poor dentition and multiple cavities present. Extreme tenderness to palpation around tooth #22. Moderate swelling under chin on left side. No discharge. No TMJ pain. Neck: No stridor.   Cardiovascular: Normal rate, regular rhythm.  Good peripheral circulation. Respiratory: Normal respiratory effort without tachypnea or retractions. Lungs CTAB. Good air entry to the bases with no decreased or absent breath sounds. Musculoskeletal: Full range of motion to all extremities. No gross deformities appreciated. Tender to palpation over lumbar spine. Neurologic:  Normal speech and language. No gross focal neurologic deficits are appreciated.  Skin:  Skin is warm, dry and intact. No rash noted.   ____________________________________________   LABS (all labs ordered are listed, but only abnormal results are displayed)  Labs Reviewed  COMPREHENSIVE METABOLIC PANEL - Abnormal; Notable for the following:       Result Value   Sodium 134 (*)    All other components within normal limits  CBC   ____________________________________________  EKG   ____________________________________________  RADIOLOGY  Ct Soft Tissue Neck W Contrast  Result Date: 12/28/2016 CLINICAL DATA:  Initial evaluation for left-sided dental pain for 2 days. EXAM: CT NECK WITH CONTRAST TECHNIQUE: Multidetector CT imaging of the neck was performed using the standard protocol following the bolus administration of intravenous contrast. CONTRAST:  67mL ISOVUE-300 IOPAMIDOL (ISOVUE-300) INJECTION 61% COMPARISON:  None. FINDINGS:  Pharynx and larynx: Oral cavity within normal limits without mass lesion or loculated fluid collection. There is asymmetric swelling with hazy inflammatory stranding within the left masticator space (series 2, image 49). Asymmetric thickening of the left platysmas. Findings suggestive of acute cellulitis. Odontogenic origin is suspected with dental caries evident within the adjacent dentition. Dehiscence of the alveolar ridge at the left second bicuspid (series 2, image 44). No discrete abscess or drainable fluid collection identified. Palatine tonsils symmetric and within normal limits. Parapharyngeal fat preserved and within normal limits. Nasopharynx normal. Epiglottis normal. Vallecula clear. Retropharyngeal soft tissues within normal limits. Remainder of the hypopharynx and supraglottic larynx within normal limits. True cords symmetric and normal. Subglottic airway clear. Salivary glands: Salivary glands including the parotid and submandibular glands within normal limits. Thyroid: Thyroid grossly unremarkable, although evaluation limited by body habitus. Lymph nodes: Few mildly prominent left level 1 B nodes noted measuring up to 7  mm, likely reactive. No other pathologically enlarged lymph nodes identified within the neck. Vascular: Normal intravascular enhancement seen throughout the neck. Scattered vascular calcifications noted about the carotid bifurcations. 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 Limited intracranial: Unremarkable. Visualized orbits: Visualized globes and orbits within normal limits. Mastoids and visualized paranasal sinuses: Scattered mucosal thickening within the ethmoidal air cells, sphenoid sinuses, and maxillary sinuses. No air-fluid level to suggest active sinus infection. Mild rightward bowing of the nasal septum with associated left concha bullosa noted. Visualized mastoids are clear. Middle ear cavities are clear. Skeleton: No acute osseous abnormality. No  worrisome lytic or blastic osseous lesions. Upper chest: Visualized upper mediastinum demonstrates no acute abnormality. Visualized lungs are clear. Other: None. IMPRESSION: Asymmetric soft tissue swelling with inflammatory stranding within the left masticator space, consistent with cellulitis. Adjacent dental carie within the left mandible suggests an odontogenic origin. No discrete abscess or drainable fluid collection identified. Electronically Signed   By: Jeannine Boga M.D.   On: 12/28/2016 17:07    ____________________________________________    PROCEDURES  Procedure(s) performed:    Procedures    Medications  clindamycin (CLEOCIN) IVPB 600 mg (0 mg Intravenous Stopped 12/28/16 1806)  HYDROcodone-acetaminophen (NORCO/VICODIN) 5-325 MG per tablet 1 tablet (1 tablet Oral Given 12/28/16 1554)  iopamidol (ISOVUE-300) 61 % injection 75 mL (75 mLs Intravenous Contrast Given 12/28/16 1633)     ____________________________________________   INITIAL IMPRESSION / ASSESSMENT AND PLAN / ED COURSE  Pertinent labs & imaging results that were available during my care of the patient were reviewed by me and considered in my medical decision making (see chart for details).  Review of the [place] CSRS was performed in accordance of the [place] prior to dispensing any controlled drugs.     Patient's diagnosis is consistent with dental infection. Vital signs and exam are reassuring. CBC and CMP are unremarkable. I ordered a neck CT for concern of Ludwig angina. Neck CT indicates cellulitis from dental infection. Patient was given IV clindamycin. Patient is agitated and verbally expresses frustration with not having insurance and not being able to afford specialists, dentists, going to the doctor. Patient will be discharged home with prescriptions for amoxicillin and a very short course of Vicodin. Education was provided. Patient is to follow up with dentist as directed. Patient is given ED  precautions to return to the ED for any worsening or new symptoms.     ____________________________________________  FINAL CLINICAL IMPRESSION(S) / ED DIAGNOSES  Final diagnoses:  Dental infection      NEW MEDICATIONS STARTED DURING THIS VISIT:  Discharge Medication List as of 12/28/2016  5:54 PM    START taking these medications   Details  amoxicillin (AMOXIL) 500 MG capsule Take 1 capsule (500 mg total) by mouth 3 (three) times daily., Starting Fri 12/28/2016, Print    HYDROcodone-acetaminophen (NORCO/VICODIN) 5-325 MG tablet Take 1 tablet by mouth every 6 (six) hours as needed for moderate pain., Starting Fri 12/28/2016, Print            This chart was dictated using voice recognition software/Dragon. Despite best efforts to proofread, errors can occur which can change the meaning. Any change was purely unintentional.    Laban Emperor, PA-C 12/28/16 1836    Lisa Roca, MD 12/29/16 [phone]

## 2017-03-20 ENCOUNTER — Encounter: Payer: Self-pay | Admitting: Medical Oncology

## 2017-03-20 ENCOUNTER — Emergency Department
Admission: EM | Admit: 2017-03-20 | Discharge: 2017-03-20 | Disposition: A | Discharge status: Home / Self Care | Payer: Medicaid Other | Attending: Emergency Medicine | Admitting: Emergency Medicine

## 2017-03-20 DIAGNOSIS — F17210 Nicotine dependence, cigarettes, uncomplicated: Secondary | ICD-10-CM | POA: Insufficient documentation

## 2017-03-20 DIAGNOSIS — I10 Essential (primary) hypertension: Secondary | ICD-10-CM | POA: Diagnosis not present

## 2017-03-20 DIAGNOSIS — Z79899 Other long term (current) drug therapy: Secondary | ICD-10-CM | POA: Diagnosis not present

## 2017-03-20 DIAGNOSIS — K061 Gingival enlargement: Secondary | ICD-10-CM | POA: Insufficient documentation

## 2017-03-20 DIAGNOSIS — J0100 Acute maxillary sinusitis, unspecified: Secondary | ICD-10-CM | POA: Diagnosis not present

## 2017-03-20 DIAGNOSIS — J45909 Unspecified asthma, uncomplicated: Secondary | ICD-10-CM | POA: Insufficient documentation

## 2017-03-20 DIAGNOSIS — K047 Periapical abscess without sinus: Secondary | ICD-10-CM

## 2017-03-20 DIAGNOSIS — K0889 Other specified disorders of teeth and supporting structures: Secondary | ICD-10-CM | POA: Diagnosis present

## 2017-03-20 MED ORDER — TRAMADOL HCL 50 MG PO TABS: 50.0000 mg | ORAL_TABLET | Freq: Four times a day (QID) | ORAL | 0 refills | Status: DC | PRN

## 2017-03-20 MED ORDER — AMOXICILLIN 500 MG PO CAPS: 500.0000 mg | ORAL_CAPSULE | Freq: Three times a day (TID) | ORAL | 0 refills | Status: DC

## 2017-03-20 MED ORDER — FEXOFENADINE-PSEUDOEPHED ER 60-120 MG PO TB12: 1.0000 | ORAL_TABLET | Freq: Two times a day (BID) | ORAL | 0 refills | Status: DC

## 2017-03-20 MED ORDER — IBUPROFEN 800 MG PO TABS: 800.0000 mg | ORAL_TABLET | Freq: Three times a day (TID) | ORAL | 0 refills | Status: DC | PRN

## 2017-03-20 NOTE — ED Notes (Signed)
Pt to ed with c/o dental pain to left lower mouth and face.  Pt states he has been having pain for several months but the swelling started over the last few days,.

## 2017-03-20 NOTE — ED Provider Notes (Signed)
[facility] Emergency Department Provider Note   ____________________________________________   First MD Initiated Contact with Patient 03/20/17 [id number]     (approximate)  I have reviewed the triage vital signs and the nursing notes.   HISTORY  Chief Complaint Facial Pain and Dental Pain    HPI Ronald Lucas is a 56 y.o. male  Patient complaining of dental pain and sinus pressure for 4-6 weeks. Patient state unable to afford co-pay at the walk-in dental clinic. Patient state he is applying for disability and has not worked in 5 years. Patient denies any fever associated this complaint. Patient denies any vision disturbance. Patient state is moderate and transient relief when coming to the ED and started on antibiotics and pain medication. Patient state condition returns approximately 2 weeks after cessation of antibiotics. Patient rates his pain as a 10 over 10.   Past Medical History:  Diagnosis Date  . Asthma   . Hypertension   . Panic attacks     There are no active problems to display for this patient.   Past Surgical History:  Procedure Laterality Date  . CARDIAC SURGERY    . FRACTURE SURGERY      Prior to Admission medications   Medication Sig Start Date End Date Taking? Authorizing Provider  amoxicillin (AMOXIL) 500 MG capsule Take 1 capsule (500 mg total) by mouth 3 (three) times daily. 12/28/16   Laban Emperor, PA-C  amoxicillin (AMOXIL) 500 MG capsule Take 1 capsule (500 mg total) by mouth 3 (three) times daily. 03/20/17   Sable Feil, PA-C  fexofenadine-pseudoephedrine (ALLEGRA-D) 60-120 MG 12 hr tablet Take 1 tablet by mouth 2 (two) times daily. 03/20/17   Sable Feil, PA-C  gabapentin (NEURONTIN) 300 MG capsule Take 1 capsule (300 mg total) by mouth 3 (three) times daily. 03/27/16 03/27/17  Menshew, Dannielle Karvonen, PA-C  HYDROcodone-acetaminophen (NORCO/VICODIN) 5-325 MG tablet Take 1 tablet by mouth every 6 (six) hours as  needed for moderate pain. 12/28/16   Laban Emperor, PA-C  ibuprofen (ADVIL,MOTRIN) 800 MG tablet Take 1 tablet (800 mg total) by mouth every 8 (eight) hours as needed. 03/20/17   Sable Feil, PA-C  ketorolac (TORADOL) 10 MG tablet Take 1 tablet (10 mg total) by mouth every 6 (six) hours as needed for moderate pain. 10/28/15   Johnn Hai, PA-C  methocarbamol (ROBAXIN) 500 MG tablet Take 500 mg by mouth 4 (four) times daily.    [provider]  nabumetone (RELAFEN) 750 MG tablet Take 1 tablet (750 mg total) by mouth 2 (two) times daily. 03/27/16   Menshew, Dannielle Karvonen, PA-C  orphenadrine (NORFLEX) 100 MG tablet Take 1 tablet (100 mg total) by mouth 2 (two) times daily as needed for muscle spasms. 03/27/16   Menshew, Dannielle Karvonen, PA-C  traMADol (ULTRAM) 50 MG tablet Take 1 tablet (50 mg total) by mouth every 6 (six) hours as needed for moderate pain. 03/20/17   Sable Feil, PA-C    Allergies Buspar [buspirone]; Flexeril [cyclobenzaprine]; Morphine and related; Penicillins; Prednisone; and Tetanus toxoids  No family history on file.  Social History Social History  Substance Use Topics  . Smoking status: Current Some Day Smoker    Types: Cigarettes  . Smokeless tobacco: Not on file  . Alcohol use No    Review of Systems Constitutional: No fever/chills Eyes: No visual changes. ENT: No sore throat. Dental pain and sinus pressure Cardiovascular: Denies chest pain. Respiratory: Denies shortness of  breath. Gastrointestinal: No abdominal pain.  No nausea, no vomiting.  No diarrhea.  No constipation. Genitourinary: Negative for dysuria. Musculoskeletal: Chronic back pain Skin: Negative for rash. Neurological: Negative for headaches, focal weakness or numbness. Allergic/Immunilogical:  See medication list ____________________________________________   PHYSICAL EXAM:  VITAL SIGNS: ED Triage Vitals  Enc Vitals Group     BP 03/20/17 0835 (!) 172/99     Pulse Rate  03/20/17 0835 80     Resp 03/20/17 0835 20     Temp 03/20/17 0835 97.8 F (36.6 C)     Temp Source 03/20/17 0835 Oral     SpO2 03/20/17 0835 97 %     Weight 03/20/17 0831 300 lb (136.1 kg)     Height --      Head Circumference --      Peak Flow --      Pain Score 03/20/17 0830 10     Pain Loc --      Pain Edu? --      Excl. in [place]? --     Constitutional: Alert and oriented. Well appearing and in no acute distress. Eyes: Conjunctivae are normal. PERRL. EOMI. Head: Atraumatic. Nose:  And edematous nasal turbinates with clear rhinorrhea.  Bilateral maxillary guarding. Mouth/Throat: Mucous membranes are moist.  Oropharynx non-erythematous.  Devitalize tooth #22 with moderate gingival edema. Neck: No stridor.  No cervical spine tenderness to palpation. Cardiovascular: Normal rate, regular rhythm. Grossly normal heart sounds.  Good peripheral circulation. Respiratory: Normal respiratory effort.  No retractions. Lungs CTAB. Neurologic:  Normal speech and language. No gross focal neurologic deficits are appreciated. No gait instability. Skin:  Skin is warm, dry and intact. No rash noted. Psychiatric: Mood and affect are normal. Speech and behavior are normal.  ____________________________________________   LABS (all labs ordered are listed, but only abnormal results are displayed)  Labs Reviewed - No data to display ____________________________________________  EKG   ____________________________________________  RADIOLOGY  No results found.  ____________________________________________   PROCEDURES  Procedure(s) performed: None  Procedures  Critical Care performed: No  ____________________________________________   INITIAL IMPRESSION / ASSESSMENT AND PLAN / ED COURSE  Pertinent labs & imaging results that were available during my care of the patient were reviewed by me and considered in my medical decision making (see chart for details).   Dental pain and sinus  congestion. Patient given discharge care instructions. Patient advised definitive dental evaluation treatment is needed for resolution of his complaint.      ____________________________________________   FINAL CLINICAL IMPRESSION(S) / ED DIAGNOSES  Final diagnoses:  Dental infection  Subacute maxillary sinusitis      NEW MEDICATIONS STARTED DURING THIS VISIT:  New Prescriptions   AMOXICILLIN (AMOXIL) 500 MG CAPSULE    Take 1 capsule (500 mg total) by mouth 3 (three) times daily.   FEXOFENADINE-PSEUDOEPHEDRINE (ALLEGRA-D) 60-120 MG 12 HR TABLET    Take 1 tablet by mouth 2 (two) times daily.   IBUPROFEN (ADVIL,MOTRIN) 800 MG TABLET    Take 1 tablet (800 mg total) by mouth every 8 (eight) hours as needed.   TRAMADOL (ULTRAM) 50 MG TABLET    Take 1 tablet (50 mg total) by mouth every 6 (six) hours as needed for moderate pain.     Note:  This document was prepared using Dragon voice recognition software and may include unintentional dictation errors.    Sable Feil, PA-C 03/20/17 [place], [place], MD 03/23/17 1302

## 2017-03-20 NOTE — ED Triage Notes (Signed)
Pt reports dental pain to left side and sinus pressure for "a while now".

## 2017-04-05 ENCOUNTER — Emergency Department
Admission: EM | Admit: 2017-04-05 | Discharge: 2017-04-05 | Disposition: A | Discharge status: Home / Self Care | Payer: Medicaid Other | Attending: Emergency Medicine | Admitting: Emergency Medicine

## 2017-04-05 ENCOUNTER — Emergency Department: Payer: Medicaid Other

## 2017-04-05 DIAGNOSIS — I10 Essential (primary) hypertension: Secondary | ICD-10-CM | POA: Diagnosis not present

## 2017-04-05 DIAGNOSIS — K0889 Other specified disorders of teeth and supporting structures: Secondary | ICD-10-CM | POA: Diagnosis not present

## 2017-04-05 DIAGNOSIS — R0789 Other chest pain: Secondary | ICD-10-CM | POA: Diagnosis not present

## 2017-04-05 DIAGNOSIS — F17210 Nicotine dependence, cigarettes, uncomplicated: Secondary | ICD-10-CM | POA: Insufficient documentation

## 2017-04-05 DIAGNOSIS — Z79899 Other long term (current) drug therapy: Secondary | ICD-10-CM | POA: Insufficient documentation

## 2017-04-05 DIAGNOSIS — J45909 Unspecified asthma, uncomplicated: Secondary | ICD-10-CM | POA: Diagnosis not present

## 2017-04-05 DIAGNOSIS — R079 Chest pain, unspecified: Secondary | ICD-10-CM | POA: Insufficient documentation

## 2017-04-05 LAB — BASIC METABOLIC PANEL
Anion gap: 7 (ref 5–15)
BUN: 17 mg/dL (ref 6–20)
CO2: 25 mmol/L (ref 22–32)
Calcium: 9.7 mg/dL (ref 8.9–10.3)
Chloride: 103 mmol/L (ref 101–111)
Creatinine, Ser: 1.29 mg/dL — ABNORMAL HIGH (ref 0.61–1.24)
GFR calc Af Amer: >60 mL/min (ref >60)
GFR calc non Af Amer: >60 mL/min (ref >60)
Glucose, Bld: 186 mg/dL — ABNORMAL HIGH (ref 65–99)
Potassium: 4.2 mmol/L (ref 3.5–5.1)
Sodium: 135 mmol/L (ref 135–145)

## 2017-04-05 LAB — CBC
HCT: 46.4 % (ref 40.0–52.0)
Hemoglobin: 16 g/dL (ref 13.0–18.0)
MCH: 30.5 pg (ref 26.0–34.0)
MCHC: 34.5 g/dL (ref 32.0–36.0)
MCV: 88.5 fL (ref 80.0–100.0)
Platelets: 243 10*3/uL (ref 150–440)
RBC: 5.24 MIL/uL (ref 4.40–5.90)
RDW: 13.6 % (ref 11.5–14.5)
WBC: 10 10*3/uL (ref 3.8–10.6)

## 2017-04-05 LAB — TROPONIN I
Troponin I: <0.03 ng/mL (ref <0.03)
Troponin I: <0.03 ng/mL (ref <0.03)

## 2017-04-05 MED ORDER — LISINOPRIL 20 MG PO TABS: 20.0000 mg | ORAL_TABLET | Freq: Every day | ORAL | 1 refills | Status: DC

## 2017-04-05 MED ORDER — CLINDAMYCIN HCL 150 MG PO CAPS
300.0000 mg | ORAL_CAPSULE | Freq: Once | ORAL | Status: AC
  Administered 2017-04-05: 300 mg via ORAL
  Filled 2017-04-05: qty 2

## 2017-04-05 MED ORDER — CLINDAMYCIN HCL 300 MG PO CAPS: 300.0000 mg | ORAL_CAPSULE | Freq: Four times a day (QID) | ORAL | 0 refills | Status: AC

## 2017-04-05 NOTE — ED Provider Notes (Signed)
[facility] Emergency Department Provider Note   ____________________________________________   I have reviewed the triage vital signs and the nursing notes.   HISTORY  Chief Complaint Chest Pain   History limited by: Not Limited   HPI Ronald Lucas is a 56 y.o. male who presents to the emergency department today because of concerns for chest discomfort. He describes it as pressure. Says it was associated with hand tingling. It got worse today. He does state however that is been going on and off for the past couple of days. The patient states that he has been battling bad dental pain and infections. He has been seen in the emergency department for this but states he is having a hard time following up with a dentist. He does think that this pain is causing increasing stress. Denies any fevers.   Past Medical History:  Diagnosis Date  . Asthma   . Hypertension   . Panic attacks     There are no active problems to display for this patient.   Past Surgical History:  Procedure Laterality Date  . CARDIAC SURGERY    . FRACTURE SURGERY      Prior to Admission medications   Medication Sig Start Date End Date Taking? Authorizing Provider  amoxicillin (AMOXIL) 500 MG capsule Take 1 capsule (500 mg total) by mouth 3 (three) times daily. 12/28/16   Laban Emperor, PA-C  amoxicillin (AMOXIL) 500 MG capsule Take 1 capsule (500 mg total) by mouth 3 (three) times daily. 03/20/17   Sable Feil, PA-C  fexofenadine-pseudoephedrine (ALLEGRA-D) 60-120 MG 12 hr tablet Take 1 tablet by mouth 2 (two) times daily. 03/20/17   Sable Feil, PA-C  gabapentin (NEURONTIN) 300 MG capsule Take 1 capsule (300 mg total) by mouth 3 (three) times daily. 03/27/16 03/27/17  Menshew, Dannielle Karvonen, PA-C  HYDROcodone-acetaminophen (NORCO/VICODIN) 5-325 MG tablet Take 1 tablet by mouth every 6 (six) hours as needed for moderate pain. 12/28/16   Laban Emperor, PA-C  ibuprofen  (ADVIL,MOTRIN) 800 MG tablet Take 1 tablet (800 mg total) by mouth every 8 (eight) hours as needed. 03/20/17   Sable Feil, PA-C  ketorolac (TORADOL) 10 MG tablet Take 1 tablet (10 mg total) by mouth every 6 (six) hours as needed for moderate pain. 10/28/15   Johnn Hai, PA-C  methocarbamol (ROBAXIN) 500 MG tablet Take 500 mg by mouth 4 (four) times daily.    [provider]  nabumetone (RELAFEN) 750 MG tablet Take 1 tablet (750 mg total) by mouth 2 (two) times daily. 03/27/16   Menshew, Dannielle Karvonen, PA-C  orphenadrine (NORFLEX) 100 MG tablet Take 1 tablet (100 mg total) by mouth 2 (two) times daily as needed for muscle spasms. 03/27/16   Menshew, Dannielle Karvonen, PA-C  traMADol (ULTRAM) 50 MG tablet Take 1 tablet (50 mg total) by mouth every 6 (six) hours as needed for moderate pain. 03/20/17   Sable Feil, PA-C    Allergies Buspar [buspirone]; Flexeril [cyclobenzaprine]; Morphine and related; Penicillins; Prednisone; and Tetanus toxoids  Family History: Father with congestive heart failure Uncle died of heart attack  Social History Social History  Substance Use Topics  . Smoking status: Current Some Day Smoker    Types: Cigarettes  . Smokeless tobacco: Never Used  . Alcohol use No    Review of Systems Constitutional: No fever/chills Eyes: No visual changes. ENT: Positive for dental pain. Cardiovascular: Positive for chest pain. Respiratory: Denies shortness of breath. Gastrointestinal:  No abdominal pain.  No nausea, no vomiting.  No diarrhea.   Genitourinary: Negative for dysuria. Musculoskeletal: Negative for back pain. Skin: Negative for rash. Neurological: Positive for hand numbness.   ____________________________________________   PHYSICAL EXAM:  VITAL SIGNS: ED Triage Vitals  Enc Vitals Group     BP 04/05/17 1749 (!) 135/103     Pulse Rate 04/05/17 1749 76     Resp 04/05/17 1749 20     Temp 04/05/17 1749 97.6 F (36.4 C)     Temp Source  04/05/17 1749 Oral     SpO2 04/05/17 1749 97 %     Weight 04/05/17 1750 300 lb (136.1 kg)     Height 04/05/17 1750 5\' 11"  (1.803 m)     Head Circumference --      Peak Flow --      Pain Score 04/05/17 1749 8   Constitutional: Alert and oriented. Well appearing and in no distress. Eyes: Conjunctivae are normal.  ENT   Head: Normocephalic and atraumatic.   Nose: No congestion/rhinnorhea.   Mouth/Throat: Mucous membranes are moist.   Neck: No stridor. Hematological/Lymphatic/Immunilogical: No cervical lymphadenopathy. Cardiovascular: Normal rate, regular rhythm.  No murmurs, rubs, or gallops.  Respiratory: Normal respiratory effort without tachypnea nor retractions. Breath sounds are clear and equal bilaterally. No wheezes/rales/rhonchi. Gastrointestinal: Soft and non tender. No rebound. No guarding.  Genitourinary: Deferred Musculoskeletal: Normal range of motion in all extremities. No lower extremity edema. Neurologic:  Normal speech and language. No gross focal neurologic deficits are appreciated.  Skin:  Skin is warm, dry and intact. No rash noted. Psychiatric: Mood and affect are normal. Speech and behavior are normal. Patient exhibits appropriate insight and judgment.  ____________________________________________    LABS (pertinent positives/negatives)  Labs Reviewed  BASIC METABOLIC PANEL - Abnormal; Notable for the following:       Result Value   Glucose, Bld 186 (*)    Creatinine, Ser 1.29 (*)    All other components within normal limits  CBC  TROPONIN I  TROPONIN I     ____________________________________________   EKG  I, Nance Pear, attending physician, personally viewed and interpreted this EKG  EKG Time: 1745 Rate: 78 Rhythm: sinus rhythm Axis: normal Intervals: qtc 428 QRS: narrow, q waves V2 ST changes: no st elevation Impression: abnormal ekg   ____________________________________________    RADIOLOGY  CXR IMPRESSION:   No edema or consolidation.       ____________________________________________   PROCEDURES  Procedures  ____________________________________________   INITIAL IMPRESSION / ASSESSMENT AND PLAN / ED COURSE  Pertinent labs & imaging results that were available during my care of the patient were reviewed by me and considered in my medical decision making (see chart for details).  Patient presents to the emergency department today because of concerns for chest pain and ental pain. ----------------------------------------- 7:05 PM on 04/05/2017 -----------------------------------------   OBSERVATION CARE: This patient is being placed under observation care for the following reasons: Chest pain with repeat testing to rule out ischemia  ----------------------------------------- 7:46 PM on 04/05/2017 -----------------------------------------  Patient continues to feel better in terms of his chest. He does have concern about dental infection/pain. Will give dose of antibiotics here.   ----------------------------------------- 9:25 PM on 04/05/2017 -----------------------------------------   END OF OBSERVATION STATUS: After an appropriate period of observation, this patient is being discharged due to the following reason(s):  2nd negative troponin.     ____________________________________________   FINAL CLINICAL IMPRESSION(S) / ED DIAGNOSES  Final diagnoses:  Nonspecific chest  pain  Pain, dental     Note: This dictation was prepared with Dragon dictation. Any transcriptional errors that result from this process are unintentional     Nance Pear, MD 04/05/17 2130

## 2017-04-05 NOTE — ED Triage Notes (Signed)
Pt c/o chest pain that started this afternoon after having severe dental pain , having SOB with tingling in hands , pt is hyperventilating in triage.Ronald Lucas

## 2017-04-05 NOTE — ED Notes (Signed)
Patient transported to X-ray 

## 2017-04-05 NOTE — Discharge Instructions (Signed)
OPTIONS FOR DENTAL FOLLOW UP CARE ° °[place] Department of Health and Human Services - Local Safety Net Dental Clinics °http://www.ncdhhs.gov/dph/oralhealth/services/safetynetclinics.htm °  °[facility] (336-562-3123) ° °[facility] [place] (919-933-9087) ° °[facility] ([phone] ext [phone]) ° °[facility] ([phone]) ° °[facility] ([phone]) °This clinic caters to the indigent population and is on a lottery system. °Location: °[facility], [address], [place] °Clinic Hours: °Wednesdays from 6pm - 9pm, patients seen by a lottery system. °For dates, call or go to [email or web address].[facility].edu/[facility]/patients/Dental-[facility] °Services: °Cleanings, fillings and simple extractions. °Payment Options: °DENTAL WORK IS FREE OF CHARGE. Bring proof of income or support. °Best way to get seen: °Arrive at 5:15 pm - this is a lottery, NOT first come/first serve, so arriving earlier will not increase your chances of being seen. °  °  °[facility] °[phone] °Select option 1 for emergencies °  °Location: °[facility], [address], [place] °Clinic Hours: °No walk-ins accepted - call the day before to schedule an appointment. °Check in times are 9:30 am and 1:30 pm. °Services: °Simple extractions, temporary fillings, pulpectomy/pulp debridement, uncomplicated abscess drainage. °Payment Options: °PAYMENT IS DUE AT THE TIME OF SERVICE.  Fee is usually $100-200, additional surgical procedures (e.g. abscess drainage) may be extra. °Cash, checks, Visa/MasterCard accepted.  Can file Medicaid if patient is covered for dental - patient should call case worker to check. °No discount for [facility] patients. °Best way to get seen: °MUST call the day before and get onto the schedule. Can usually be seen the next 1-2 days. No walk-ins accepted. °  °  °[facility] °[phone] °   °Location: °[facility], [address], [place] °Clinic Hours: °M, W, Th, F 8am or 1:30pm, Tues 9a or 1:30 - first come/first served. °Services: °Simple extractions, temporary fillings, uncomplicated abscess drainage.  You do not need to be an [place] resident. °Payment Options: °PAYMENT IS DUE AT THE TIME OF SERVICE. °Dental insurance, otherwise sliding scale - bring proof of income or support. °Depending on income and treatment needed, cost is usually $50-200. °Best way to get seen: °Arrive early as it is first come/first served. °  °  °[facility] °[phone] °  °Location: °[address] °Clinic Hours: °Mon-Thu 8a-5p °Services: °Most basic dental services including extractions and fillings. °Payment Options: °PAYMENT IS DUE AT THE TIME OF SERVICE. °Sliding scale, up to 50% off - bring proof if income or support. °Medicaid with dental option accepted. °Best way to get seen: °Call to schedule an appointment, can usually be seen within 2 weeks OR they will try to see walk-ins - show up at 8a or 2p (you may have to wait). °  °  °[facility] °919-245-2435 °[place] RESIDENTS ONLY °  °Location: °[facility], [address], [place], [place] [address] °Clinic Hours: By appointment only. °Monday - Thursday 8am-5pm, Friday 8am-12pm °Services: Cleanings, fillings, extractions. °Payment Options: °PAYMENT IS DUE AT THE TIME OF SERVICE. °Cash, Visa or MasterCard. Sliding scale - $30 minimum per service. °Best way to get seen: °Come in to office, complete packet and make an appointment - need proof of income °or support monies for each household member and proof of [place] residence. °Usually takes about a month to get in. °  °  °[facility] °[phone] °  °Location: °[address]   [place] °Clinic Hours: Walk-in Urgent Care Dental Services are offered Monday-Friday  mornings only. °The numbers of emergencies accepted daily is limited to the number of °providers available. °Maximum 15 - Mondays, Wednesdays & Thursdays °Maximum 10 - Tuesdays & Fridays °Services: °You do not need to be a  [place] resident to be seen for a dental emergency. °Emergencies are defined as pain, swelling, abnormal bleeding, or dental trauma. Walkins will receive x-rays if needed. °NOTE: Dental cleaning is not an emergency. °Payment Options: °PAYMENT IS DUE AT THE TIME OF SERVICE. °Minimum co-pay is $40.00 for uninsured patients. °Minimum co-pay is $3.00 for Medicaid with dental coverage. °Dental Insurance is accepted and must be presented at time of visit. °Medicare does not cover dental. °Forms of payment: Cash, credit card, checks. °Best way to get seen: °If not previously registered with the clinic, walk-in dental registration begins at 7:15 am and is on a first come/first serve basis. °If previously registered with the clinic, call to make an appointment. °  °  °[facility] °[phone] °[place] RESIDENTS ONLY °  °Location: °[address], [place], [place] °Clinic Hours: °Mon-Thu 10a-2p °Services: Extractions only! °Payment Options: °FREE (donations accepted) - bring proof of income or support °Best way to get seen: °Call and schedule an appointment OR come at 8am on the 1st Monday of every month (except for holidays) when it is first come/first served. °  °  °[facility] °[phone] °  °Location: °[address], [place] °Clinic Hours: °Friday mornings °Services, Payment Options, Best way to get seen: °Call for info °

## 2017-06-15 ENCOUNTER — Emergency Department
Admission: EM | Admit: 2017-06-15 | Discharge: 2017-06-15 | Disposition: A | Discharge status: Home / Self Care | Payer: Medicaid Other | Attending: Emergency Medicine | Admitting: Emergency Medicine

## 2017-06-15 ENCOUNTER — Encounter: Payer: Self-pay | Admitting: Emergency Medicine

## 2017-06-15 ENCOUNTER — Emergency Department: Payer: Medicaid Other

## 2017-06-15 DIAGNOSIS — F17210 Nicotine dependence, cigarettes, uncomplicated: Secondary | ICD-10-CM | POA: Diagnosis not present

## 2017-06-15 DIAGNOSIS — Z79899 Other long term (current) drug therapy: Secondary | ICD-10-CM | POA: Diagnosis not present

## 2017-06-15 DIAGNOSIS — R0600 Dyspnea, unspecified: Secondary | ICD-10-CM | POA: Insufficient documentation

## 2017-06-15 DIAGNOSIS — I10 Essential (primary) hypertension: Secondary | ICD-10-CM | POA: Insufficient documentation

## 2017-06-15 DIAGNOSIS — R0602 Shortness of breath: Secondary | ICD-10-CM | POA: Diagnosis not present

## 2017-06-15 DIAGNOSIS — Z760 Encounter for issue of repeat prescription: Secondary | ICD-10-CM | POA: Diagnosis not present

## 2017-06-15 DIAGNOSIS — J45909 Unspecified asthma, uncomplicated: Secondary | ICD-10-CM | POA: Diagnosis not present

## 2017-06-15 DIAGNOSIS — F419 Anxiety disorder, unspecified: Secondary | ICD-10-CM

## 2017-06-15 LAB — COMPREHENSIVE METABOLIC PANEL
ALT: 35 U/L (ref 17–63)
AST: 41 U/L (ref 15–41)
Albumin: 4.2 g/dL (ref 3.5–5.0)
Alkaline Phosphatase: 43 U/L (ref 38–126)
Anion gap: 9 (ref 5–15)
BUN: 12 mg/dL (ref 6–20)
CO2: 25 mmol/L (ref 22–32)
Calcium: 8.8 mg/dL — ABNORMAL LOW (ref 8.9–10.3)
Chloride: 103 mmol/L (ref 101–111)
Creatinine, Ser: 0.92 mg/dL (ref 0.61–1.24)
GFR calc Af Amer: >60 mL/min (ref >60)
GFR calc non Af Amer: >60 mL/min (ref >60)
Glucose, Bld: 111 mg/dL — ABNORMAL HIGH (ref 65–99)
Potassium: 4.1 mmol/L (ref 3.5–5.1)
Sodium: 137 mmol/L (ref 135–145)
Total Bilirubin: 0.7 mg/dL (ref 0.3–1.2)
Total Protein: 7.6 g/dL (ref 6.5–8.1)

## 2017-06-15 LAB — CBC
HCT: 42.8 % (ref 40.0–52.0)
Hemoglobin: 15 g/dL (ref 13.0–18.0)
MCH: 30.9 pg (ref 26.0–34.0)
MCHC: 35.1 g/dL (ref 32.0–36.0)
MCV: 88.2 fL (ref 80.0–100.0)
Platelets: 244 10*3/uL (ref 150–440)
RBC: 4.85 MIL/uL (ref 4.40–5.90)
RDW: 13.8 % (ref 11.5–14.5)
WBC: 8.7 10*3/uL (ref 3.8–10.6)

## 2017-06-15 LAB — TROPONIN I: Troponin I: <0.03 ng/mL (ref <0.03)

## 2017-06-15 MED ORDER — LISINOPRIL 20 MG PO TABS: 20.0000 mg | ORAL_TABLET | Freq: Every day | ORAL | 0 refills | Status: DC

## 2017-06-15 NOTE — ED Provider Notes (Signed)
[facility] Emergency Department Provider Note   ____________________________________________   First MD Initiated Contact with Patient 06/15/17 1415     (approximate)  I have reviewed the triage vital signs and the nursing notes.   HISTORY  Chief Complaint Shortness of Breath    HPI Ronald Lucas is a 56 y.o. male patient complaining of dyspnea and intermitting chest wall pain. Onset of complaint was yesterday. Patient state recently had dental surgery earlier this week. Patient state he was notified by his practitioner that she can no longer treat him for his medical condition. Patient has a history of bipolar panic attacks. Patient also said has a history of hypertension. Patient state he is out of his hypertension medicine and is concerned he might have a stroke. Patient also states increased anxiety but will be seeing his psychiatrist on the 17th of this month. Patient rates his discomfort as a 6/10. No palliative measures for complaint.  ) Refill hypertension medicine until he can see new PCP.   Past Medical History:  Diagnosis Date  . Asthma   . Hypertension   . Panic attacks     There are no active problems to display for this patient.   Past Surgical History:  Procedure Laterality Date  . CARDIAC SURGERY    . FRACTURE SURGERY      Prior to Admission medications   Medication Sig Start Date End Date Taking? Authorizing Provider  amoxicillin (AMOXIL) 500 MG capsule Take 1 capsule (500 mg total) by mouth 3 (three) times daily. 12/28/16   Laban Emperor, PA-C  amoxicillin (AMOXIL) 500 MG capsule Take 1 capsule (500 mg total) by mouth 3 (three) times daily. 03/20/17   Sable Feil, PA-C  fexofenadine-pseudoephedrine (ALLEGRA-D) 60-120 MG 12 hr tablet Take 1 tablet by mouth 2 (two) times daily. 03/20/17   Sable Feil, PA-C  gabapentin (NEURONTIN) 300 MG capsule Take 1 capsule (300 mg total) by mouth 3 (three) times daily. 03/27/16  03/27/17  Menshew, Dannielle Karvonen, PA-C  HYDROcodone-acetaminophen (NORCO/VICODIN) 5-325 MG tablet Take 1 tablet by mouth every 6 (six) hours as needed for moderate pain. 12/28/16   Laban Emperor, PA-C  ibuprofen (ADVIL,MOTRIN) 800 MG tablet Take 1 tablet (800 mg total) by mouth every 8 (eight) hours as needed. 03/20/17   Sable Feil, PA-C  ketorolac (TORADOL) 10 MG tablet Take 1 tablet (10 mg total) by mouth every 6 (six) hours as needed for moderate pain. 10/28/15   Johnn Hai, PA-C  lisinopril (PRINIVIL,ZESTRIL) 20 MG tablet Take 1 tablet (20 mg total) by mouth daily. 04/05/17 04/05/18  Nance Pear, MD  lisinopril (PRINIVIL,ZESTRIL) 20 MG tablet Take 1 tablet (20 mg total) by mouth daily. 06/15/17 06/15/18  Sable Feil, PA-C  methocarbamol (ROBAXIN) 500 MG tablet Take 500 mg by mouth 4 (four) times daily.    [provider]  nabumetone (RELAFEN) 750 MG tablet Take 1 tablet (750 mg total) by mouth 2 (two) times daily. 03/27/16   Menshew, Dannielle Karvonen, PA-C  orphenadrine (NORFLEX) 100 MG tablet Take 1 tablet (100 mg total) by mouth 2 (two) times daily as needed for muscle spasms. 03/27/16   Menshew, Dannielle Karvonen, PA-C  traMADol (ULTRAM) 50 MG tablet Take 1 tablet (50 mg total) by mouth every 6 (six) hours as needed for moderate pain. 03/20/17   Sable Feil, PA-C    Allergies Buspar [buspirone]; Flexeril [cyclobenzaprine]; Morphine and related; Penicillins; Prednisone; and Tetanus toxoids  No  family history on file.  Social History Social History  Substance Use Topics  . Smoking status: Current Some Day Smoker    Packs/day: 0.10    Types: Cigarettes  . Smokeless tobacco: Never Used  . Alcohol use No    Review of Systems  Constitutional: No fever/chills Eyes: No visual changes. ENT: No sore throat. Cardiovascular: Denies chest pain. Respiratory: Denies shortness of breath. Gastrointestinal: No abdominal pain.  No nausea, no vomiting.  No diarrhea.  No  constipation. Genitourinary: Negative for dysuria. Musculoskeletal: Negative for back pain. Skin: Negative for rash. Neurological: Negative for headaches, focal weakness or numbness. Psychiatric:Panic attack Endocrine: Hypertension Allergic/Immunilogical: See medication list. ____________________________________________   PHYSICAL EXAM:  VITAL SIGNS: ED Triage Vitals  Enc Vitals Group     BP 06/15/17 1224 129/76     Pulse Rate 06/15/17 1224 86     Resp 06/15/17 1224 (!) 22     Temp 06/15/17 1224 98.2 F (36.8 C)     Temp Source 06/15/17 1224 Oral     SpO2 06/15/17 1224 94 %     Weight 06/15/17 1225 300 lb (136.1 kg)     Height 06/15/17 1225 5\' 11"  (1.803 m)     Head Circumference --      Peak Flow --      Pain Score 06/15/17 1224 6     Pain Loc --      Pain Edu? --      Excl. in [place]? --     Constitutional: Alert and oriented. Well appearing and in no acute distress. Neck: No stridor.  Cardiovascular: Normal rate, regular rhythm. Grossly normal heart sounds.  Good peripheral circulation. Respiratory: Normal respiratory effort.  No retractions. Lungs CTAB. Gastrointestinal: Soft and nontender. No distention. No abdominal bruits. No CVA tenderness. Musculoskeletal: No lower extremity tenderness nor edema.  No joint effusions. Neurologic:  Normal speech and language. No gross focal neurologic deficits are appreciated. No gait instability. Skin:  Skin is warm, dry and intact. No rash noted. Psychiatric: Mood and affect are normal. Speech and behavior are normal.  ____________________________________________   LABS (all labs ordered are listed, but only abnormal results are displayed)  Labs Reviewed  COMPREHENSIVE METABOLIC PANEL - Abnormal; Notable for the following:       Result Value   Glucose, Bld 111 (*)    Calcium 8.8 (*)    All other components within normal limits  CBC  TROPONIN I   ____________________________________________  EKG  EKG read by heart  station Dr. showing normal sinus rhythm ____________________________________________  RADIOLOGY  Dg Chest 2 View  Result Date: 06/15/2017 CLINICAL DATA:  Shortness breath beginning last night.  Dizziness. EXAM: CHEST  2 VIEW COMPARISON:  04/05/2017 FINDINGS: Heart size is normal. Mediastinal shadows are normal. There is mild basilar pulmonary scarring but no sign of active infiltrate, mass, effusion or collapse. No heart failure. No acute bone finding. IMPRESSION: Mild chronic scarring at the lung bases. No active disease identified. Electronically Signed   By: Nelson Chimes M.D.   On: 06/15/2017 12:55    ____________________________________________   PROCEDURES  Procedure(s) performed: None  Procedures  Critical Care performed: No  ____________________________________________   INITIAL IMPRESSION / ASSESSMENT AND PLAN / ED COURSE  Pertinent labs & imaging results that were available during my care of the patient were reviewed by me and considered in my medical decision making (see chart for details).  Patient presents with dysphagia secondary to an anxiety due to lack of hypertension  medication. Patient was told that he could have a stroke. The neck. Continue hypertension medications. Discussed negative labs and x-ray finding with patient. Patient given discharge Instructions and advised to follow-up with new PCP. Patient hypertension medication was refilled for one month.    ____________________________________________   FINAL CLINICAL IMPRESSION(S) / ED DIAGNOSES  Final diagnoses:  Dyspnea, unspecified type  Anxiety  Encounter for medication refill      NEW MEDICATIONS STARTED DURING THIS VISIT:  Discharge Medication List as of 06/15/2017  2:37 PM    START taking these medications   Details  !! lisinopril (PRINIVIL,ZESTRIL) 20 MG tablet Take 1 tablet (20 mg total) by mouth daily., Starting Sat 06/15/2017, Until Sun 06/15/2018, Print     !! - Potential duplicate  medications found. Please discuss with provider.       Note:  This document was prepared using Dragon voice recognition software and may include unintentional dictation errors.    Sable Feil, PA-C 06/15/17 1455    Schaevitz, Randall An, MD 06/15/17 [phone]

## 2017-06-15 NOTE — ED Triage Notes (Signed)
States SOB since yesterday. States chest pain but it is intermittent.

## 2017-06-15 NOTE — ED Notes (Signed)
Pt observed up walking around outside ER without difficulty. Pt Drinking Mt. Dew Pt in NAD at this time.

## 2017-06-15 NOTE — Discharge Instructions (Signed)
Follow-up with scheduled medical appointment.

## 2017-07-02 ENCOUNTER — Ambulatory Visit: Payer: Self-pay | Admitting: Family Medicine

## 2017-07-17 ENCOUNTER — Ambulatory Visit: Payer: Medicaid Other | Admitting: Family Medicine

## 2017-07-19 ENCOUNTER — Ambulatory Visit (INDEPENDENT_AMBULATORY_CARE_PROVIDER_SITE_OTHER): Payer: Medicaid Other | Admitting: Nurse Practitioner

## 2017-07-19 ENCOUNTER — Encounter: Payer: Self-pay | Admitting: Nurse Practitioner

## 2017-07-19 VITALS — BP 132/77 | HR 90 | Temp 97.4°F | Ht 71.0 in | Wt 309.2 lb

## 2017-07-19 DIAGNOSIS — F410 Panic disorder [episodic paroxysmal anxiety] without agoraphobia: Secondary | ICD-10-CM | POA: Insufficient documentation

## 2017-07-19 DIAGNOSIS — F3162 Bipolar disorder, current episode mixed, moderate: Secondary | ICD-10-CM | POA: Diagnosis not present

## 2017-07-19 DIAGNOSIS — Z6841 Body Mass Index (BMI) 40.0 and over, adult: Secondary | ICD-10-CM | POA: Insufficient documentation

## 2017-07-19 DIAGNOSIS — R062 Wheezing: Secondary | ICD-10-CM

## 2017-07-19 DIAGNOSIS — I10 Essential (primary) hypertension: Secondary | ICD-10-CM

## 2017-07-19 DIAGNOSIS — R0602 Shortness of breath: Secondary | ICD-10-CM

## 2017-07-19 DIAGNOSIS — M5441 Lumbago with sciatica, right side: Secondary | ICD-10-CM | POA: Diagnosis not present

## 2017-07-19 DIAGNOSIS — M5442 Lumbago with sciatica, left side: Secondary | ICD-10-CM

## 2017-07-19 DIAGNOSIS — G8929 Other chronic pain: Secondary | ICD-10-CM | POA: Diagnosis not present

## 2017-07-19 DIAGNOSIS — L918 Other hypertrophic disorders of the skin: Secondary | ICD-10-CM | POA: Diagnosis not present

## 2017-07-19 DIAGNOSIS — M545 Low back pain, unspecified: Secondary | ICD-10-CM | POA: Insufficient documentation

## 2017-07-19 DIAGNOSIS — Z889 Allergy status to unspecified drugs, medicaments and biological substances status: Secondary | ICD-10-CM | POA: Insufficient documentation

## 2017-07-19 DIAGNOSIS — I998 Other disorder of circulatory system: Secondary | ICD-10-CM | POA: Insufficient documentation

## 2017-07-19 DIAGNOSIS — Z7689 Persons encountering health services in other specified circumstances: Secondary | ICD-10-CM

## 2017-07-19 MED ORDER — UMECLIDINIUM-VILANTEROL 62.5-25 MCG/INH IN AEPB: 1.0000 | INHALATION_SPRAY | Freq: Every day | RESPIRATORY_TRACT | 3 refills | Status: DC

## 2017-07-19 NOTE — Assessment & Plan Note (Addendum)
Currently active with hyperactivity/anxiety.  Pt cites is sometimes down and medications make this worse.  Has psychiatric treatment w/ [facility] in [place], [place].  Psychiatrist Myron/Therapist Molly.  Plan: 1. Continue psychiatric management.  Reinforced need to maintain psychiatrist.  If not a therapeutic relationship any longer, can try another MD but only stop current relationship after new one is established. 2. Pt not taking medications and would likely benefit from more medication than prn alprazolam. 3. Followup as needed.

## 2017-07-19 NOTE — Progress Notes (Signed)
Subjective:    Patient ID: Ronald Lucas, male    DOB: 12/05/1960, 56 y.o.   MRN: [id number]  Ronald Lucas is a 56 y.o. male presenting on 07/19/2017 for [facility] Pt last seen by PCP several years ago.  Obtain records from Dr. Brunetta Genera & Threasa Alpha, NP In [place]. No recent screenings.  Will need annual physical.  Chronic Back Pain MRI of back at [facility].  Wants to see doc for pain management/back pain.  Would like to see neurosurgery.  Pt states pain has significantly worsened since his last visits with orthopedics.  Low back pain midline and sciatica that started on right side, now has pain bilaterally.  Throbbing severe pain in midline back.  He has not worked since 2012 related to his back pain for disability.  Pt states tramadol doesn't help much. - Numbness of all extremities - outer thighs, feet, hands. - States gabapentin caused vomiting.  Methocarbamol caused vomiting. Has tried multiple other medications w/ vomiting and GI side effects. - Denies having done any PT for back pain.  - Denies loss of bowel or bladder function.   Bipolar Disorder Abilify x 2 days.  Not currently taking any medications. Risperdone made him very low.  Has had alprazolam prescribed for panic disorder.  Takes 2x per day 0.5 mg.  Dr. Parks Neptune and Cloyde Reams at [facility], [place]. [employer].  Last appointment was about 2 weeks ago and again on 10/30. "People don't like me."  Reactions, things he says.  Personality.  - License is permanently revoked.   Shortness of breath  Wheezing and short of breath, random symptoms w/o known cause. Pt cites asthma as child w/ resolution until adult. Has albuterol inhaler and has used 1x in last 2 weeks. - Has used this in past and has had good symptom control.  Used for about 3 months off and on w/ intermittent use. - Cigarette smoking from age 87 prior 12-3/4 ppd x approx 20 years.  for last 6 years only 3-4  cig per day.     Skin Tags Pt has numerous skin tags - some causing pain.  Pt cites exposure to HPV w/ prior sexual partner.  Does not have tags on scrotum or penis.  Only has tags on his inner thighs.   Hypertension Taking lisinopril and tolerating well w/o side effects.  Has no concerns about this today.  Past Medical History:  Diagnosis Date  . ADHD   . Allergy   . Asthma   . Bipolar 1 disorder ([place])   . Depression   . Hypertension   . Panic attacks   . PTSD (post-traumatic stress disorder)    Past Surgical History:  Procedure Laterality Date  . CARDIAC SURGERY    . FRACTURE SURGERY     Social History   Social History  . Marital status: Single    Spouse name: N/A  . Number of children: N/A  . Years of education: N/A   Occupational History  . Not on file.   Social History Main Topics  . Smoking status: Current Every Day Smoker    Packs/day: 0.10    Types: Cigarettes  . Smokeless tobacco: Never Used  . Alcohol use No  . Drug use: No  . Sexual activity: No   Other Topics Concern  . Not on file   Social History Narrative  . No narrative on file   History reviewed. No  pertinent family history.   Pt is poor historian about medications and allergies.  Allergies and medications updated to best of my ability.  Current Outpatient Prescriptions on File Prior to Visit  Medication Sig  . lisinopril (PRINIVIL,ZESTRIL) 20 MG tablet Take 1 tablet (20 mg total) by mouth daily. (Patient not taking: Reported on 07/19/2017)  . traMADol (ULTRAM) 50 MG tablet Take 1 tablet (50 mg total) by mouth every 6 (six) hours as needed for moderate pain. (Patient not taking: Reported on 07/19/2017)   No current facility-administered medications on file prior to visit.    Lumbar MRI 07/05/15 - Review of report (Images unavailable): Mild lumbar spine degenerative changes resulting in moderate left L4-5 and mild left L3-4 neural foraminal narrowing. No significant spinal canal  stenosis.  Lumbar X-Ray 01/18/16 - Review of report (Images unavailable): As numbered for this report, there is sacralization of L5 with an assimilation joint on the left. This numbering results in the presence of rudimentary ribs at T12. Sacralization of L5 corresponds with the report of MRI lumbar spine dated 07/05/2015. Vertebral body heights are preserved.  No spondylolisthesis is identified.  Mild intervertebral disc space loss is present at L5-S1, similar to prior MRI.  Scattered small endplate osteophytes are present.  Lower lumbar facet arthropathy.  Vascular calcifications are noted.  Frontal view of the bilateral sacroiliac joints is unremarkable.   Review of Systems  Constitutional: Negative.   HENT: Negative.   Eyes: Negative.   Respiratory: Positive for shortness of breath and wheezing.   Cardiovascular: Negative.   Gastrointestinal: Positive for nausea and vomiting.  Endocrine: Negative.   Genitourinary: Negative.   Musculoskeletal: Positive for arthralgias and back pain.  Skin:       Multiple skin tags  Allergic/Immunologic: Negative.   Neurological: Negative.   Hematological: Negative.   Psychiatric/Behavioral: Positive for agitation, behavioral problems and dysphoric mood. Negative for self-injury, sleep disturbance and suicidal ideas. The patient is nervous/anxious.    Per HPI unless specifically indicated above      Objective:    BP 132/77 (BP Location: Left Arm, Patient Position: Sitting, Cuff Size: Large)   Pulse 90   Temp (!) 97.4 F (36.3 C) (Oral)   Ht 5\' 11"  (1.803 m)   Wt (!) 309 lb 3.2 oz (140.3 kg)   BMI 43.12 kg/m   Wt Readings from Last 3 Encounters:  07/19/17 (!) 309 lb 3.2 oz (140.3 kg)  06/15/17 300 lb (136.1 kg)  04/05/17 300 lb (136.1 kg)    Physical Exam General - morbidly obese, well-appearing, NAD HEENT - Normocephalic, atraumatic Neck - supple, non-tender, no LAD, no thyromegaly Heart - RRR, no murmurs heard Lungs - Diminished air  movement.  Clear throughout all lobes, no wheezing, crackles, or rhonchi. Normal work of breathing. Musculoskeletal -   Low Back  Inspection: Normal appearance, large body habitus, no spinal deformity, symmetrical.  Palpation: No tenderness over spinous processes. Bilateral lumbar paraspinal muscles tender and with  hypertonicity/spasm.  ROM: Reduced active ROM forward flex / back extension, rotation L/R with discomfort in all direction  Special Testing: Pt refused to perform seated SLR citing it would "make his back spasm."  Strength: Pt refused strength testing  Neurovascular: intact distal sensation to light touch Extremeties - non-tender, no edema, cap refill < 2 seconds, peripheral pulses intact +2 bilaterally Skin - warm, diaphoretic Neuro - awake, alert, oriented x3, antalgic gait Psych - Labile, anxious mood and affect, agitated behavior w/ pacing during entire exam.  Results  for orders placed or performed during the hospital encounter of 06/15/17  CBC  Result Value Ref Range   WBC 8.7 3.8 - 10.6 K/uL   RBC 4.85 4.40 - 5.90 MIL/uL   Hemoglobin 15.0 13.0 - 18.0 g/dL   HCT 42.8 40.0 - 52.0 %   MCV 88.2 80.0 - 100.0 fL   MCH 30.9 26.0 - 34.0 pg   MCHC 35.1 32.0 - 36.0 g/dL   RDW 13.8 11.5 - 14.5 %   Platelets 244 150 - 440 K/uL  Troponin I  Result Value Ref Range   Troponin I <0.03 <0.03 ng/mL  Comprehensive metabolic panel  Result Value Ref Range   Sodium 137 135 - 145 mmol/L   Potassium 4.1 3.5 - 5.1 mmol/L   Chloride 103 101 - 111 mmol/L   CO2 25 22 - 32 mmol/L   Glucose, Bld 111 (H) 65 - 99 mg/dL   BUN 12 6 - 20 mg/dL   Creatinine, Ser 0.92 0.61 - 1.24 mg/dL   Calcium 8.8 (L) 8.9 - 10.3 mg/dL   Total Protein 7.6 6.5 - 8.1 g/dL   Albumin 4.2 3.5 - 5.0 g/dL   AST 41 15 - 41 U/L   ALT 35 17 - 63 U/L   Alkaline Phosphatase 43 38 - 126 U/L   Total Bilirubin 0.7 0.3 - 1.2 mg/dL   GFR calc non Af Amer >60 >60 mL/min   GFR calc Af Amer >60 >60 mL/min   Anion gap 9 5 -  15      Assessment & Plan:   Problem List Items Addressed This Visit      Cardiovascular and Mediastinum   Hypertension    Controlled w/ BP near goal of less than 130/80 and seemingly in significant pain today.  Pt taking lisinopril 20 mg once daily and tolerating well.  Plan: 1. Continue lisinopril 20 mg once daily. 2. Follow up every 3-6 months. Collect labs at next appointment.      Relevant Orders   Comprehensive metabolic panel     Nervous and Auditory   Chronic midline low back pain with bilateral sciatica - Primary    Severe and uncontrolled.  Pt cites chronic back pain.  Diaphoretic today either from pain or anxiety.  Pt reports prior MRI  Plan: 1. Pt requests neurosurgery referral.  Cites has had MRI and Xray of spine.  No treatment for pain since last evaluation at [facility]. 2. Records reviewed, mild degenerative disc disease noted on last Xray w/o evidence of spinal stenosis or spondylolisthesis. Sacralization of L5. 3. Follow up as needed.      Relevant Medications   ALPRAZolam (XANAX) 0.5 MG tablet   Other Relevant Orders   Ambulatory referral to Neurosurgery     Musculoskeletal and Integument   Skin tag    Pt notes multiple skin tags of axillae and inner thighs.  Painful and would like to discuss treatment. Pt is concerned for cancer.  Plan: 1. Reassured pt likely benign.   2. Can visit dermatology for removal and testing of skin tags if preferred.  Pt desires referral for removal for pain relief even if benign. 3. Referral to dermatology placed. 4. Follow up as needed.      Relevant Orders   Ambulatory referral to Dermatology     Other   Shortness of breath    Pt notes childhood asthma that resolved but has worsened again several years ago.  Has taken Anoro Ellipta intermittently and notes improvement. Is  concerned r/t commercials about the medication that states should not be taken for asthma.  Plan: 1. Reassured pt that his current symptoms are likely  r/t panic disorder and/or COPD w/ history of smoking.   - Possible mixed restrictive/obstructive airway disease w/ smoking and asthma/allergy history. - Recommend PFT for evaluation of wheezing and shortness of breath. 2. Resume Anoro Ellipta until 14 days prior to  PFT.  Discussed w/ pt should be stopped 2 weeks prior for adequate evaluation. 3. Encouraged smoking cessation - pt refuses citing coping w/ bipolar disorder. 4. Follow up after testing performed.  May consider pulmonology/allergy referral in future if needed.       Relevant Medications   umeclidinium-vilanterol (ANORO ELLIPTA) 62.5-25 MCG/INH AEPB   Other Relevant Orders   Pulmonary function test   Wheezing    See AP shortness of breath.      Relevant Medications   umeclidinium-vilanterol (ANORO ELLIPTA) 62.5-25 MCG/INH AEPB   Other Relevant Orders   Pulmonary function test   Bipolar disorder, current episode mixed, moderate (HCC)    Currently active with hyperactivity/anxiety.  Pt cites is sometimes down and medications make this worse.  Has psychiatric treatment w/ [facility] in [place], [place].  Psychiatrist Myron/Therapist Molly.  Plan: 1. Continue psychiatric management.  Reinforced need to maintain psychiatrist.  If not a therapeutic relationship any longer, can try another MD but only stop current relationship after new one is established. 2. Pt not taking medications and would likely benefit from more medication than prn alprazolam. 3. Followup as needed.      BMI 40.0-44.9, adult (HCC)    Morbid obesity.   - Complications: vascular calcification on lumbar Xray likely indicates some degree of ASCVD.  Plan: 1. Will check A1c, Lipid panel, CMP. 2. Recommend increasing physical activity, reducing food intake. 3. Reviewed likely contributing to worsening low back pain. 4. Followup w/ annual physical in 3 months.      Relevant Orders   Lipid panel   Comprehensive metabolic panel   Hemoglobin A1c    Other  Visit Diagnoses    Encounter to establish care     Previous PCP was Roberts Gaudy, NP.  Records will be requested.  Past medical, family, and surgical history reviewed w/ pt.  Recommend annual physicial for screenings in next 3 months.       Meds ordered this encounter  Medications  . ALPRAZolam (XANAX) 0.5 MG tablet    Sig: Take 0.5 mg by mouth 2 (two) times daily.  . DiphenhydrAMINE HCl (BENADRYL ALLERGY PO)    Sig: Take 1 tablet by mouth 3 (three) times daily.  . Guaifenesin-Codeine (GUAIFENESIN AC PO)    Sig: Take 1 tablet by mouth 3 (three) times daily.  Marland Kitchen DISCONTD: Umeclidinium-Vilanterol (ANORO ELLIPTA IN)    Sig: Inhale 1 Device into the lungs.  . umeclidinium-vilanterol (ANORO ELLIPTA) 62.5-25 MCG/INH AEPB    Sig: Inhale 1 puff into the lungs daily.    Dispense:  90 each    Refill:  3    Order Specific Question:   Supervising Provider    Answer:   Olin Hauser [[id number]]    Follow up plan: Return in about 3 months (around 10/19/2017) for annual physical.  Cassell Smiles, DNP, AGPCNP-BC Adult Gerontology Primary Care Nurse Practitioner [facility] 07/19/2017, 5:05 PM

## 2017-07-19 NOTE — Assessment & Plan Note (Signed)
Morbid obesity.   - Complications: vascular calcification on lumbar Xray likely indicates some degree of ASCVD.  Plan: 1. Will check A1c, Lipid panel, CMP. 2. Recommend increasing physical activity, reducing food intake. 3. Reviewed likely contributing to worsening low back pain. 4. Followup w/ annual physical in 3 months.

## 2017-07-19 NOTE — Assessment & Plan Note (Addendum)
Pt notes childhood asthma that resolved but has worsened again several years ago.  Has taken Anoro Ellipta intermittently and notes improvement. Is concerned r/t commercials about the medication that states should not be taken for asthma.  Plan: 1. Reassured pt that his current symptoms are likely r/t panic disorder and/or COPD w/ history of smoking.   - Possible mixed restrictive/obstructive airway disease w/ smoking and asthma/allergy history. - Recommend PFT for evaluation of wheezing and shortness of breath. 2. Resume Anoro Ellipta until 14 days prior to  PFT.  Discussed w/ pt should be stopped 2 weeks prior for adequate evaluation. 3. Encouraged smoking cessation - pt refuses citing coping w/ bipolar disorder. 4. Follow up after testing performed.  May consider pulmonology/allergy referral in future if needed.

## 2017-07-19 NOTE — Assessment & Plan Note (Signed)
See AP shortness of breath.

## 2017-07-19 NOTE — Patient Instructions (Addendum)
Emiel, Thank you for coming in to clinic today.  1. You will have phone calls from Neurosurgery and Dermatology to schedule an appointment.  2. For your breathing:  - we are resuming your inhaler.  If not covered, work with your pharmacy and insurance to find one that will be covered.  Please schedule a follow-up appointment with Cassell Smiles, AGNP. Return in about 3 months (around 10/19/2017) for annual physical.  If you have any other questions or concerns, please feel free to call the clinic or send a message through [place]. You may also schedule an earlier appointment if necessary.  You will receive a survey after today's visit either digitally by e-mail or paper by [employer]. Your experiences and feedback matter to [place].  Please respond so we know how we are doing as we provide care for you.   Cassell Smiles, DNP, AGNP-BC Adult Gerontology Nurse Practitioner [place]

## 2017-07-19 NOTE — Assessment & Plan Note (Addendum)
Severe and uncontrolled.  Pt cites chronic back pain.  Diaphoretic today either from pain or anxiety.  Pt reports prior MRI  Plan: 1. Pt requests neurosurgery referral.  Cites has had MRI and Xray of spine.  No treatment for pain since last evaluation at [facility]. 2. Records reviewed, mild degenerative disc disease noted on last Xray w/o evidence of spinal stenosis or spondylolisthesis. Sacralization of L5. 3. Follow up as needed.

## 2017-07-19 NOTE — Assessment & Plan Note (Signed)
Controlled w/ BP near goal of less than 130/80 and seemingly in significant pain today.  Pt taking lisinopril 20 mg once daily and tolerating well.  Plan: 1. Continue lisinopril 20 mg once daily. 2. Follow up every 3-6 months. Collect labs at next appointment.

## 2017-07-19 NOTE — Assessment & Plan Note (Signed)
Pt notes multiple skin tags of axillae and inner thighs.  Painful and would like to discuss treatment. Pt is concerned for cancer.  Plan: 1. Reassured pt likely benign.   2. Can visit dermatology for removal and testing of skin tags if preferred.  Pt desires referral for removal for pain relief even if benign. 3. Referral to dermatology placed. 4. Follow up as needed.

## 2017-07-22 ENCOUNTER — Other Ambulatory Visit: Payer: Self-pay

## 2017-07-22 ENCOUNTER — Other Ambulatory Visit: Payer: Self-pay | Admitting: Nurse Practitioner

## 2017-07-22 MED ORDER — TIOTROPIUM BROMIDE MONOHYDRATE 18 MCG IN CAPS: 18.0000 ug | ORAL_CAPSULE | Freq: Every day | RESPIRATORY_TRACT | 12 refills | Status: DC

## 2017-07-22 MED ORDER — LISINOPRIL 20 MG PO TABS: 20.0000 mg | ORAL_TABLET | Freq: Every day | ORAL | 1 refills | Status: DC

## 2017-09-18 ENCOUNTER — Other Ambulatory Visit: Payer: Self-pay

## 2017-09-18 MED ORDER — LISINOPRIL 20 MG PO TABS: 20.0000 mg | ORAL_TABLET | Freq: Every day | ORAL | 1 refills | Status: DC

## 2017-11-08 DIAGNOSIS — R200 Anesthesia of skin: Secondary | ICD-10-CM | POA: Diagnosis not present

## 2017-11-08 DIAGNOSIS — G8929 Other chronic pain: Secondary | ICD-10-CM | POA: Diagnosis not present

## 2017-11-08 DIAGNOSIS — M545 Low back pain: Secondary | ICD-10-CM | POA: Diagnosis not present

## 2017-11-12 ENCOUNTER — Other Ambulatory Visit: Payer: Self-pay | Admitting: Student

## 2017-11-12 DIAGNOSIS — G8929 Other chronic pain: Secondary | ICD-10-CM

## 2017-11-12 DIAGNOSIS — M545 Low back pain: Principal | ICD-10-CM

## 2017-11-25 ENCOUNTER — Telehealth: Payer: Self-pay | Admitting: Nurse Practitioner

## 2017-11-25 ENCOUNTER — Other Ambulatory Visit: Payer: Self-pay

## 2017-11-25 MED ORDER — LISINOPRIL 20 MG PO TABS: 20.0000 mg | ORAL_TABLET | Freq: Every day | ORAL | 0 refills | Status: DC

## 2017-11-25 NOTE — Telephone Encounter (Deleted)
Pt. Called requesting a refill on lisinopril  wal [place].

## 2017-11-27 ENCOUNTER — Ambulatory Visit: Payer: Medicaid Other

## 2017-12-12 ENCOUNTER — Ambulatory Visit
Admission: RE | Admit: 2017-12-12 | Discharge: 2017-12-12 | Disposition: A | Discharge status: Home / Self Care | Payer: Medicaid Other | Source: Ambulatory Visit | Attending: Student | Admitting: Student

## 2017-12-12 DIAGNOSIS — M545 Low back pain: Secondary | ICD-10-CM | POA: Insufficient documentation

## 2017-12-12 DIAGNOSIS — G8929 Other chronic pain: Secondary | ICD-10-CM

## 2018-01-06 ENCOUNTER — Telehealth: Payer: Self-pay

## 2018-01-06 NOTE — Telephone Encounter (Signed)
I received a phone call from [facility] stating that they was going to refer the patient to [facility]. They wanted to notify us, because the pt currently have medicaid and he will possible need an approval from [place].

## 2018-01-06 NOTE — Telephone Encounter (Signed)
I would agree with this transition to pain management and will send a referral when needed.

## 2018-01-14 ENCOUNTER — Telehealth: Payer: Self-pay | Admitting: Nurse Practitioner

## 2018-01-14 DIAGNOSIS — G8929 Other chronic pain: Secondary | ICD-10-CM

## 2018-01-14 DIAGNOSIS — M5441 Lumbago with sciatica, right side: Principal | ICD-10-CM

## 2018-01-14 DIAGNOSIS — M5442 Lumbago with sciatica, left side: Principal | ICD-10-CM

## 2018-01-14 NOTE — Telephone Encounter (Signed)
After being referred to [facility] for neurosurgery, he is having issues with transportation.  He asked for a referral to [facility].  His call back number is [phone]

## 2018-01-14 NOTE — Telephone Encounter (Signed)
The pt requesting a referral to [facility]. The pt was seen at Neurosurgery an they placed a referral, but Pain Management require the referral from the patients PCP.

## 2018-01-30 ENCOUNTER — Telehealth: Payer: Self-pay | Admitting: Nurse Practitioner

## 2018-01-30 NOTE — Telephone Encounter (Signed)
Please change patient's referral to [facility] in [place] as requested.

## 2018-01-30 NOTE — Telephone Encounter (Signed)
Referral faxed over to [facility].

## 2018-01-30 NOTE — Telephone Encounter (Signed)
Pt. Called states that he did not want to go to [facility] pain  [facility]inic.He was requesting a referral to  [facility] Fax  Number# [phone]

## 2018-02-19 ENCOUNTER — Other Ambulatory Visit: Payer: Self-pay

## 2018-02-19 MED ORDER — LISINOPRIL 20 MG PO TABS: 20.0000 mg | ORAL_TABLET | Freq: Every day | ORAL | 1 refills | Status: DC

## 2018-02-27 ENCOUNTER — Ambulatory Visit: Payer: Medicaid Other | Admitting: Student in an Organized Health Care Education/Training Program

## 2018-04-16 DIAGNOSIS — F319 Bipolar disorder, unspecified: Secondary | ICD-10-CM | POA: Diagnosis not present

## 2018-04-16 DIAGNOSIS — F410 Panic disorder [episodic paroxysmal anxiety] without agoraphobia: Secondary | ICD-10-CM | POA: Diagnosis not present

## 2018-04-24 ENCOUNTER — Telehealth: Payer: Self-pay | Admitting: Nurse Practitioner

## 2018-04-24 DIAGNOSIS — R0602 Shortness of breath: Secondary | ICD-10-CM

## 2018-04-24 DIAGNOSIS — R062 Wheezing: Secondary | ICD-10-CM

## 2018-04-24 NOTE — Telephone Encounter (Signed)
He was changed from Anoro to Spiriva at his last visit with me.  If he feels he needs Anoro again because his breathing is worsening, he will need a new appointment.  He should be able to call his pharmacy for refills of Spiriva.

## 2018-04-24 NOTE — Telephone Encounter (Signed)
Pt needs refill anoro sent to [facility].  His call back [phone]

## 2018-04-25 MED ORDER — UMECLIDINIUM-VILANTEROL 62.5-25 MCG/INH IN AEPB: 1.0000 | INHALATION_SPRAY | Freq: Every day | RESPIRATORY_TRACT | 1 refills | Status: DC

## 2018-04-25 NOTE — Telephone Encounter (Signed)
The pt state he was currently using Anoro and have been using that inhaler.

## 2018-05-09 ENCOUNTER — Other Ambulatory Visit: Payer: Self-pay

## 2018-05-09 ENCOUNTER — Encounter: Payer: Self-pay | Admitting: Nurse Practitioner

## 2018-05-09 ENCOUNTER — Ambulatory Visit (INDEPENDENT_AMBULATORY_CARE_PROVIDER_SITE_OTHER): Payer: Medicaid Other | Admitting: Nurse Practitioner

## 2018-05-09 VITALS — BP 138/78 | HR 94 | Temp 98.5°F | Ht 71.0 in | Wt 306.8 lb

## 2018-05-09 DIAGNOSIS — M5441 Lumbago with sciatica, right side: Secondary | ICD-10-CM

## 2018-05-09 DIAGNOSIS — Z1211 Encounter for screening for malignant neoplasm of colon: Secondary | ICD-10-CM | POA: Diagnosis not present

## 2018-05-09 DIAGNOSIS — J449 Chronic obstructive pulmonary disease, unspecified: Secondary | ICD-10-CM | POA: Insufficient documentation

## 2018-05-09 DIAGNOSIS — M5442 Lumbago with sciatica, left side: Secondary | ICD-10-CM | POA: Diagnosis not present

## 2018-05-09 DIAGNOSIS — Z0000 Encounter for general adult medical examination without abnormal findings: Secondary | ICD-10-CM | POA: Diagnosis not present

## 2018-05-09 DIAGNOSIS — J45909 Unspecified asthma, uncomplicated: Secondary | ICD-10-CM | POA: Insufficient documentation

## 2018-05-09 DIAGNOSIS — F4310 Post-traumatic stress disorder, unspecified: Secondary | ICD-10-CM | POA: Insufficient documentation

## 2018-05-09 DIAGNOSIS — G8929 Other chronic pain: Secondary | ICD-10-CM | POA: Diagnosis not present

## 2018-05-09 NOTE — Patient Instructions (Addendum)
Ronald Lucas,   Thank you for coming in to clinic today.  1. You will be due for FASTING BLOOD WORK.  This means you should eat no food or drink after midnight.  Drink only water or coffee without cream/sugar on the morning of your lab visit. - Please go ahead and schedule a "Lab Only" visit in the morning at the clinic for lab draw in the next 7 days. - Your results will be available about 2-3 days after blood draw.  If you have set up a MyChart account, you can can log in to MyChart online to view your results and a brief explanation. Also, we can discuss your results together at your next office visit if you would like.   2. Referral [facility] for colonoscopy.  This will provide your colon cancer screening.   3. Increase your physical activity until you are increasing your heart rate for 30 minutes on most days of the week.   4. Continue working to improve diet and add vegetables and fruits to your regular food intake.   Please schedule a follow-up appointment with Cassell Smiles, AGNP. Return in about 1 year (around 05/10/2019) for annual physical.  If you have any other questions or concerns, please feel free to call the clinic or send a message through [place]. You may also schedule an earlier appointment if necessary.  You will receive a survey after today's visit either digitally by e-mail or paper by [employer]. Your experiences and feedback matter to [place].  Please respond so we know how we are doing as we provide care for you.   Cassell Smiles, DNP, AGNP-BC Adult Gerontology Nurse Practitioner [facility], [place] Maintenance, Male A healthy lifestyle and preventive care is important for your health and wellness. Ask your health care provider about what schedule of regular examinations is right for you. What should I know about weight and diet? Eat a Healthy Diet  Eat plenty of vegetables, fruits, whole grains, low-fat dairy products, and lean  protein.  Do not eat a lot of foods high in solid fats, added sugars, or salt.  Maintain a Healthy Weight Regular exercise can help you achieve or maintain a healthy weight. You should:  Do at least 150 minutes of exercise each week. The exercise should increase your heart rate and make you sweat (moderate-intensity exercise).  Do strength-training exercises at least twice a week.  Watch Your Levels of Cholesterol and Blood Lipids  Have your blood tested for lipids and cholesterol every 5 years starting at 56 years of age. If you are at high risk for heart disease, you should start having your blood tested when you are 56 years old. You may need to have your cholesterol levels checked more often if: ? Your lipid or cholesterol levels are high. ? You are older than 56 years of age. ? You are at high risk for heart disease.  What should I know about cancer screening? Many types of cancers can be detected early and may often be prevented. Lung Cancer  You should be screened every year for lung cancer if: ? You are a current smoker who has smoked for at least 30 years. ? You are a former smoker who has quit within the past 15 years.  Talk to your health care provider about your screening options, when you should start screening, and how often you should be screened.  Colorectal Cancer  Routine colorectal cancer screening usually begins  at 56 years of age and should be repeated every 5-10 years until you are 56 years old. You may need to be screened more often if early forms of precancerous polyps or small growths are found. Your health care provider may recommend screening at an earlier age if you have risk factors for colon cancer.  Your health care provider may recommend using home test kits to check for hidden blood in the stool.  A small camera at the end of a tube can be used to examine your colon (sigmoidoscopy or colonoscopy). This checks for the earliest forms of colorectal  cancer.  Prostate and Testicular Cancer  Depending on your age and overall health, your health care provider may do certain tests to screen for prostate and testicular cancer.  Talk to your health care provider about any symptoms or concerns you have about testicular or prostate cancer.  Skin Cancer  Check your skin from head to toe regularly.  Tell your health care provider about any new moles or changes in moles, especially if: ? There is a change in a mole's size, shape, or color. ? You have a mole that is larger than a pencil eraser.  Always use sunscreen. Apply sunscreen liberally and repeat throughout the day.  Protect yourself by wearing long sleeves, pants, a wide-brimmed hat, and sunglasses when outside.  What should I know about heart disease, diabetes, and high blood pressure?  If you are 38-30 years of age, have your blood pressure checked every 3-5 years. If you are 46 years of age or older, have your blood pressure checked every year. You should have your blood pressure measured twice-once when you are at a hospital or clinic, and once when you are not at a hospital or clinic. Record the average of the two measurements. To check your blood pressure when you are not at a hospital or clinic, you can use: ? An automated blood pressure machine at a pharmacy. ? A home blood pressure monitor.  Talk to your health care provider about your target blood pressure.  If you are between 88-61 years old, ask your health care provider if you should take aspirin to prevent heart disease.  Have regular diabetes screenings by checking your fasting blood sugar level. ? If you are at a normal weight and have a low risk for diabetes, have this test once every three years after the age of 8. ? If you are overweight and have a high risk for diabetes, consider being tested at a younger age or more often.  A one-time screening for abdominal aortic aneurysm (AAA) by ultrasound is recommended  for men aged 68-75 years who are current or former smokers. What should I know about preventing infection? Hepatitis B If you have a higher risk for hepatitis B, you should be screened for this virus. Talk with your health care provider to find out if you are at risk for hepatitis B infection. Hepatitis C Blood testing is recommended for:  Everyone born from [birth year] through 1964.  Anyone with known risk factors for hepatitis C.  Sexually Transmitted Diseases (STDs)  You should be screened each year for STDs including gonorrhea and chlamydia if: ? You are sexually active and are younger than 56 years of age. ? You are older than 56 years of age and your health care provider tells you that you are at risk for this type of infection. ? Your sexual activity has changed since you were last screened and you are  at an increased risk for chlamydia or gonorrhea. Ask your health care provider if you are at risk.  Talk with your health care provider about whether you are at high risk of being infected with HIV. Your health care provider may recommend a prescription medicine to help prevent HIV infection.  What else can I do?  Schedule regular health, dental, and eye exams.  Stay current with your vaccines (immunizations).  Do not use any tobacco products, such as cigarettes, chewing tobacco, and e-cigarettes. If you need help quitting, ask your health care provider.  Limit alcohol intake to no more than 2 drinks per day. One drink equals 12 ounces of beer, 5 ounces of wine, or 1 ounces of hard liquor.  Do not use street drugs.  Do not share needles.  Ask your health care provider for help if you need support or information about quitting drugs.  Tell your health care provider if you often feel depressed.  Tell your health care provider if you have ever been abused or do not feel safe at home. This information is not intended to replace advice given to you by your health care provider. Make  sure you discuss any questions you have with your health care provider. Document Released: 03/15/2008 Document Revised: 05/16/2016 Document Reviewed: 06/21/2015 Elsevier Interactive Patient Education  [employer].

## 2018-05-09 NOTE — Progress Notes (Signed)
Subjective:    Patient ID: Ronald Lucas, male    DOB: 11/26/1960, 57 y.o.   MRN: [id number]  Ronald Lucas is a 57 y.o. male presenting on 05/09/2018 for Annual Exam   HPI Annual Physical Exam  Patient has been feeling poorly, has back pain and allergy/congestion for which he is taking benadryl.  Sleeps 4-6 hours per night interrupted with 2 hours sleep at a time.   - Pain management: Declines [facility] management.  Chronic pain is biggest concern. - Bipolar disorder: Patient is seeing psychiatrist in [place].  Is taking Xanax for psychiatric coverage.  HEALTH MAINTENANCE: Weight/BMI: stable, but patient states lost muscle, gained fat Physical activity: Limited Diet: snacking mostly with chips/cheetos Seatbelt: always Sunscreen: rarely Prostate exam/PSA: Declines exam, consents to blood testing Colon Cancer Screen: due - patient has no preference colonoscopy/cologuard HIV/HEP C: due Optometry: none Dentistry: regularly  VACCINES: Tetanus: Due - declines - patient states is allergic Influenza: declines Shingles: declines    Past Medical History:  Diagnosis Date  . ADHD   . Allergy   . Asthma   . Bipolar 1 disorder ([place])   . Depression   . Hypertension   . Panic attacks   . PTSD (post-traumatic stress disorder)    Past Surgical History:  Procedure Laterality Date  . CARDIAC SURGERY    . FRACTURE SURGERY     Social History   Socioeconomic History  . Marital status: Single    Spouse name: Not on file  . Number of children: Not on file  . Years of education: Not on file  . Highest education level: Not on file  Occupational History  . Not on file  Social Needs  . Financial resource strain: Not on file  . Food insecurity:    Worry: Not on file    Inability: Not on file  . Transportation needs:    Medical: Not on file    Non-medical: Not on file  Tobacco Use  . Smoking status: Current Every Day Smoker    Packs/day: 0.10    Types: Cigarettes  .  Smokeless tobacco: Never Used  Substance and Sexual Activity  . Alcohol use: No  . Drug use: No  . Sexual activity: Never  Lifestyle  . Physical activity:    Days per week: Not on file    Minutes per session: Not on file  . Stress: Not on file  Relationships  . Social connections:    Talks on phone: Not on file    Gets together: Not on file    Attends religious service: Not on file    Active member of club or organization: Not on file    Attends meetings of clubs or organizations: Not on file    Relationship status: Not on file  . Intimate partner violence:    Fear of current or ex partner: Not on file    Emotionally abused: Not on file    Physically abused: Not on file    Forced sexual activity: Not on file  Other Topics Concern  . Not on file  Social History Narrative  . Not on file   No family history on file. Current Outpatient Medications on File Prior to Visit  Medication Sig  . ALPRAZolam (XANAX) 0.5 MG tablet Take 0.5 mg by mouth 2 (two) times daily.  . DiphenhydrAMINE HCl (BENADRYL ALLERGY PO) Take 1 tablet by mouth 3 (three) times daily.  Marland Kitchen lisinopril (PRINIVIL,ZESTRIL) 20 MG tablet Take 1 tablet (  20 mg total) by mouth daily.  Marland Kitchen umeclidinium-vilanterol (ANORO ELLIPTA) 62.5-25 MCG/INH AEPB Inhale 1 puff into the lungs daily.  . Guaifenesin-Codeine (GUAIFENESIN AC PO) Take 1 tablet by mouth 3 (three) times daily.  . traMADol (ULTRAM) 50 MG tablet Take 1 tablet (50 mg total) by mouth every 6 (six) hours as needed for moderate pain. (Patient not taking: Reported on 05/09/2018)   No current facility-administered medications on file prior to visit.     Review of Systems  Constitutional: Negative for activity change, appetite change, fatigue and unexpected weight change.  HENT: Negative for congestion, hearing loss and trouble swallowing.   Eyes: Negative for visual disturbance.  Respiratory: Negative for choking, shortness of breath and wheezing.   Cardiovascular:  Negative for chest pain and palpitations.  Gastrointestinal: Negative for abdominal pain, blood in stool, constipation and diarrhea.  Genitourinary: Negative for difficulty urinating, discharge, flank pain, genital sores, penile pain, penile swelling, scrotal swelling and testicular pain.  Musculoskeletal: Positive for back pain. Negative for arthralgias and myalgias.  Skin: Negative for color change, rash and wound.  Allergic/Immunologic: Negative for environmental allergies.  Neurological: Negative for dizziness, seizures, weakness and headaches.  Psychiatric/Behavioral: Positive for dysphoric mood and sleep disturbance. Negative for behavioral problems, decreased concentration, hallucinations and suicidal ideas. The patient is nervous/anxious.    Per HPI unless specifically indicated above     Objective:    BP 138/78 (BP Location: Left Arm, Patient Position: Sitting, Cuff Size: Large)   Pulse 94   Temp 98.5 F (36.9 C) (Oral)   Ht 5\' 11"  (1.803 m)   Wt (!) 306 lb 12.8 oz (139.2 kg)   BMI 42.79 kg/m   Wt Readings from Last 3 Encounters:  05/09/18 (!) 306 lb 12.8 oz (139.2 kg)  07/19/17 (!) 309 lb 3.2 oz (140.3 kg)  06/15/17 300 lb (136.1 kg)    Physical Exam  Constitutional: He is oriented to person, place, and time. He appears well-developed and well-nourished. No distress.  HENT:  Head: Normocephalic and atraumatic.  Right Ear: External ear normal.  Left Ear: External ear normal.  Nose: Nose normal.  Mouth/Throat: Oropharynx is clear and moist.  Eyes: Pupils are equal, round, and reactive to light. Conjunctivae are normal.  Neck: Normal range of motion. Neck supple. No JVD present. No tracheal deviation present. No thyromegaly present.  Cardiovascular: Normal rate, regular rhythm, normal heart sounds and intact distal pulses. Exam reveals no gallop and no friction rub.  No murmur heard. Pulmonary/Chest: Effort normal and breath sounds normal. No respiratory distress.    Abdominal: Soft. Bowel sounds are normal. He exhibits no distension. There is no hepatosplenomegaly. There is no tenderness.  Genitourinary:  Genitourinary Comments: Patient declines GU exam.    Musculoskeletal:  Low Back Inspection: Normal appearance, Large body habitus, no spinal deformity, symmetrical. Palpation: No tenderness over spinous processes. Bilateral lumbar paraspinal muscles tender and with hypertonicity/spasm. ROM: Significantly reduced AROM forward flex / back extension, rotation L/R with discomfort. Special Testing: Unable to complete.  Patient pacing during HPI and exam. Strength: Unable to complete. Neurovascular: intact distal sensation to light touch   Lymphadenopathy:    He has no cervical adenopathy.  Neurological: He is alert and oriented to person, place, and time. No cranial nerve deficit.  Skin: Skin is warm and dry. Capillary refill takes less than 2 seconds.  Psychiatric: Thought content normal. His mood appears anxious. His affect is labile. His speech is rapid and/or pressured. He is agitated and hyperactive. He  expresses impulsivity. He expresses no homicidal and no suicidal ideation. He expresses no suicidal plans and no homicidal plans.  Nursing note and vitals reviewed.    Results for orders placed or performed during the hospital encounter of 06/15/17  CBC  Result Value Ref Range   WBC 8.7 3.8 - 10.6 K/uL   RBC 4.85 4.40 - 5.90 MIL/uL   Hemoglobin 15.0 13.0 - 18.0 g/dL   HCT 42.8 40.0 - 52.0 %   MCV 88.2 80.0 - 100.0 fL   MCH 30.9 26.0 - 34.0 pg   MCHC 35.1 32.0 - 36.0 g/dL   RDW 13.8 11.5 - 14.5 %   Platelets 244 150 - 440 K/uL  Troponin I  Result Value Ref Range   Troponin I <0.03 <0.03 ng/mL  Comprehensive metabolic panel  Result Value Ref Range   Sodium 137 135 - 145 mmol/L   Potassium 4.1 3.5 - 5.1 mmol/L   Chloride 103 101 - 111 mmol/L   CO2 25 22 - 32 mmol/L   Glucose, Bld 111 (H) 65 - 99 mg/dL   BUN 12 6 - 20 mg/dL   Creatinine,  Ser 0.92 0.61 - 1.24 mg/dL   Calcium 8.8 (L) 8.9 - 10.3 mg/dL   Total Protein 7.6 6.5 - 8.1 g/dL   Albumin 4.2 3.5 - 5.0 g/dL   AST 41 15 - 41 U/L   ALT 35 17 - 63 U/L   Alkaline Phosphatase 43 38 - 126 U/L   Total Bilirubin 0.7 0.3 - 1.2 mg/dL   GFR calc non Af Amer >60 >60 mL/min   GFR calc Af Amer >60 >60 mL/min   Anion gap 9 5 - 15      Assessment & Plan:   Problem List Items Addressed This Visit      Nervous and Auditory   Chronic midline low back pain with bilateral sciatica   Relevant Orders   Ambulatory referral to Pain Clinic    Other Visit Diagnoses    Encounter for annual physical exam    -  Primary   Relevant Orders   Hepatitis C antibody   HIV antibody   Lipid panel   CBC with Differential/Platelet   COMPLETE METABOLIC PANEL WITH GFR   Hemoglobin A1c   TSH   PSA   Colon cancer screening       Relevant Orders   Ambulatory referral to Gastroenterology    # Chronic Low Back Pain: Uncontrolled.  Referral to Dr. Nathanial Rancher or to alternative pain management as patient [place] of county.  Reinforced that patient will not be prescribed chronic opioids from this clinic.  Should consider repeat followup with neurosurgery. Followup prn with PCP.  # Physical exam with no new findings.  Well adult with acute concerns for back pain today as above.  Plan: 1. Obtain health maintenance screenings as above according to age. - Fasting labs in next 7 days - Increase physical activity to 30 minutes most days of the week.  - Eat healthy diet high in vegetables and fruits; low in refined carbohydrates. - Colonoscopy preferred by patient for colon cancer screening.  Referral to [facility] placed. 2. Return 1 year for annual physical.    Follow up plan: Return in about 1 year (around 05/10/2019) for annual physical.  Cassell Smiles, DNP, AGPCNP-BC Adult Gerontology Primary Care Nurse Practitioner [facility] 05/09/2018, 2:29 PM

## 2018-05-15 ENCOUNTER — Other Ambulatory Visit: Payer: Medicaid Other

## 2018-05-16 ENCOUNTER — Other Ambulatory Visit: Payer: Medicaid Other

## 2018-05-20 ENCOUNTER — Encounter: Payer: Self-pay | Admitting: *Deleted

## 2018-05-29 NOTE — Telephone Encounter (Signed)
Open in error

## 2018-06-12 DIAGNOSIS — F410 Panic disorder [episodic paroxysmal anxiety] without agoraphobia: Secondary | ICD-10-CM | POA: Diagnosis not present

## 2018-08-07 DIAGNOSIS — F410 Panic disorder [episodic paroxysmal anxiety] without agoraphobia: Secondary | ICD-10-CM | POA: Diagnosis not present

## 2018-08-12 ENCOUNTER — Other Ambulatory Visit: Payer: Self-pay

## 2018-08-12 MED ORDER — LISINOPRIL 20 MG PO TABS: 20.0000 mg | ORAL_TABLET | Freq: Every day | ORAL | 1 refills | Status: DC

## 2018-08-12 NOTE — Telephone Encounter (Signed)
Patient call requesting a refill on his lisinopril.

## 2018-10-09 DIAGNOSIS — F410 Panic disorder [episodic paroxysmal anxiety] without agoraphobia: Secondary | ICD-10-CM | POA: Diagnosis not present

## 2018-12-25 DIAGNOSIS — F410 Panic disorder [episodic paroxysmal anxiety] without agoraphobia: Secondary | ICD-10-CM | POA: Diagnosis not present

## 2019-01-20 DIAGNOSIS — F410 Panic disorder [episodic paroxysmal anxiety] without agoraphobia: Secondary | ICD-10-CM | POA: Diagnosis not present

## 2019-02-03 ENCOUNTER — Other Ambulatory Visit: Payer: Self-pay | Admitting: Nurse Practitioner

## 2019-02-10 ENCOUNTER — Telehealth: Payer: Self-pay

## 2019-02-10 NOTE — Telephone Encounter (Signed)
The pt called to check on the status of his PA for [place]. He was notified that the status is still pending approval.

## 2019-02-19 ENCOUNTER — Telehealth: Payer: Self-pay

## 2019-02-19 ENCOUNTER — Telehealth: Payer: Self-pay | Admitting: Family Medicine

## 2019-02-19 DIAGNOSIS — G8929 Other chronic pain: Secondary | ICD-10-CM

## 2019-02-19 DIAGNOSIS — M5442 Lumbago with sciatica, left side: Secondary | ICD-10-CM

## 2019-02-19 NOTE — Telephone Encounter (Signed)
Duplicate telephone encounter  See other one dated today 02/19/19  Nobie Putnam, [facility] 02/19/2019, 10:33 AM

## 2019-02-19 NOTE — Telephone Encounter (Signed)
The pt called requesting that we resubmit his referral to pain management that was sent on 01/14/2018.

## 2019-02-19 NOTE — Addendum Note (Signed)
Addended by: Olin Hauser on: 02/19/2019 10:35 AM   Modules accepted: Orders

## 2019-02-19 NOTE — Telephone Encounter (Signed)
Referral to [facility] for Pain Management, previous referral but now has resolved transportation issues, for chronic back pain with sciatica - PCP is Cassell Smiles, AGPCNP-BC - I am covering for her.   Nobie Putnam, DO [facility] 02/19/2019, 10:35 AM

## 2019-02-26 DIAGNOSIS — M129 Arthropathy, unspecified: Secondary | ICD-10-CM | POA: Diagnosis not present

## 2019-02-26 DIAGNOSIS — Z79899 Other long term (current) drug therapy: Secondary | ICD-10-CM | POA: Diagnosis not present

## 2019-02-26 DIAGNOSIS — E559 Vitamin D deficiency, unspecified: Secondary | ICD-10-CM | POA: Diagnosis not present

## 2019-02-26 DIAGNOSIS — Z79891 Long term (current) use of opiate analgesic: Secondary | ICD-10-CM | POA: Diagnosis not present

## 2019-02-26 DIAGNOSIS — M5136 Other intervertebral disc degeneration, lumbar region: Secondary | ICD-10-CM | POA: Diagnosis not present

## 2019-03-09 ENCOUNTER — Ambulatory Visit: Payer: Medicaid Other | Admitting: Family Medicine

## 2019-03-09 ENCOUNTER — Other Ambulatory Visit: Payer: Self-pay

## 2019-03-09 ENCOUNTER — Encounter: Payer: Self-pay | Admitting: Family Medicine

## 2019-03-09 VITALS — BP 138/76 | HR 98 | Temp 97.7°F | Resp 18 | Ht 71.0 in | Wt 313.6 lb

## 2019-03-09 DIAGNOSIS — L83 Acanthosis nigricans: Secondary | ICD-10-CM | POA: Diagnosis not present

## 2019-03-09 DIAGNOSIS — Z6841 Body Mass Index (BMI) 40.0 and over, adult: Secondary | ICD-10-CM | POA: Diagnosis not present

## 2019-03-09 DIAGNOSIS — F410 Panic disorder [episodic paroxysmal anxiety] without agoraphobia: Secondary | ICD-10-CM | POA: Diagnosis not present

## 2019-03-09 DIAGNOSIS — I10 Essential (primary) hypertension: Secondary | ICD-10-CM | POA: Diagnosis not present

## 2019-03-09 DIAGNOSIS — Z1159 Encounter for screening for other viral diseases: Secondary | ICD-10-CM | POA: Diagnosis not present

## 2019-03-09 DIAGNOSIS — Z889 Allergy status to unspecified drugs, medicaments and biological substances status: Secondary | ICD-10-CM

## 2019-03-09 DIAGNOSIS — G90521 Complex regional pain syndrome I of right lower limb: Secondary | ICD-10-CM

## 2019-03-09 DIAGNOSIS — F3162 Bipolar disorder, current episode mixed, moderate: Secondary | ICD-10-CM | POA: Diagnosis not present

## 2019-03-09 DIAGNOSIS — M5441 Lumbago with sciatica, right side: Secondary | ICD-10-CM

## 2019-03-09 DIAGNOSIS — Z1211 Encounter for screening for malignant neoplasm of colon: Secondary | ICD-10-CM | POA: Diagnosis not present

## 2019-03-09 DIAGNOSIS — R252 Cramp and spasm: Secondary | ICD-10-CM

## 2019-03-09 DIAGNOSIS — Z1212 Encounter for screening for malignant neoplasm of rectum: Secondary | ICD-10-CM

## 2019-03-09 DIAGNOSIS — J4540 Moderate persistent asthma, uncomplicated: Secondary | ICD-10-CM | POA: Diagnosis not present

## 2019-03-09 DIAGNOSIS — L918 Other hypertrophic disorders of the skin: Secondary | ICD-10-CM

## 2019-03-09 DIAGNOSIS — Z114 Encounter for screening for human immunodeficiency virus [HIV]: Secondary | ICD-10-CM | POA: Diagnosis not present

## 2019-03-09 DIAGNOSIS — F4310 Post-traumatic stress disorder, unspecified: Secondary | ICD-10-CM

## 2019-03-09 DIAGNOSIS — M5442 Lumbago with sciatica, left side: Secondary | ICD-10-CM

## 2019-03-09 DIAGNOSIS — G8929 Other chronic pain: Secondary | ICD-10-CM

## 2019-03-09 NOTE — Progress Notes (Addendum)
Name: Ronald Lucas   MRN: [id number]    DOB: 01/19/1961   Date:03/09/2019       Progress Note  Subjective  Chief Complaint  Chief Complaint  Patient presents with  . Establish Care  . Hypertension    cough, muscle spasm, fatigue, sob, cough think this is from BP medication    HPI  PT presents to establish care and for the following concerns:  Social: Does not have family or friends that he feels offer good support. Has a pit bull that he loves and cares for. 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  Chronic Pain: CRPS and Chronic Low back pain:  Goes to [facility] - started last week.  States has radiation from low back into BLE.  Also states he has neuropathy.  Denies weakness in either extremity.   HTN: Has been on clonidine - made him have very dry lips.  Has been on the 20mg  Lisinopril for about 8 years.  Gets cramps in BLE on occasion and thinks it could be related to Lisinopril.  Denies chest pain. Has occasional shortness of breath.  No BLE edema.  COPD/Asthma: Taking Anoro and this seems to work well for him.  Smoking about 4 cigarettes a day at this time.  Has occasional shortness of breath.  Albuterol causes heart racing so he does not use this.   Skin Darkening: Has had darkening on the bilateral cheeks for about a year. He also notes more skin tags present lately.  Discussed interplay of acanthosis nigricans and skin tags with insulin resistance.  Will check labs today and refer to dermatology if needed  Obesity: Started with pain clinic in hopes that he can get his pain under enough control to start going for walks.   He has a pit bull that he would like to walk regularly.   Bipolar, PTSD, Panic Disorder: His father had bipolar disorder.  Pt currently Dr. Agapito Games with [facility].  He has a lot of allergies to various medications and Xanax has been the only medication that helps him.    Patient Active Problem List   Diagnosis Date Noted  . Asthma  05/09/2018  . PTSD (post-traumatic stress disorder) 05/09/2018  . Chronic midline low back pain with bilateral sciatica 07/19/2017  . Skin tag 07/19/2017  . Shortness of breath 07/19/2017  . Wheezing 07/19/2017  . Multiple allergies 07/19/2017  . Bipolar disorder, current episode mixed, moderate ([place]) 07/19/2017  . Panic disorder 07/19/2017  . BMI 40.0-44.9, adult ([place]) 07/19/2017  . Vascular calcification 07/19/2017  . Complex regional pain syndrome i of right lower limb 01/18/2016  . Dupuytren's contracture 01/18/2016  . Hypertension 05/28/2011    Past Surgical History:  Procedure Laterality Date  . CARDIAC SURGERY    . FRACTURE SURGERY      History reviewed. No pertinent family history.  Social History   Socioeconomic History  . Marital status: Single    Spouse name: Not on file  . Number of children: 0  . Years of education: Not on file  . Highest education level: Not on file  Occupational History  . Occupation: disabled  Social Needs  . Financial resource strain: Not hard at all  . Food insecurity:    Worry: Never true    Inability: Never true  . Transportation needs:    Medical: No    Non-medical: No  Tobacco Use  . Smoking status: Current Every Day Smoker    Packs/day: 0.10  Types: Cigarettes  . Smokeless tobacco: Never Used  Substance and Sexual Activity  . Alcohol use: No  . Drug use: No  . Sexual activity: Not Currently  Lifestyle  . Physical activity:    Days per week: 0 days    Minutes per session: 0 min  . Stress: Only a little  Relationships  . Social connections:    Talks on phone: Twice a week    Gets together: Twice a week    Attends religious service: Never    Active member of club or organization: No    Attends meetings of clubs or organizations: Never    Relationship status: Never married  . Intimate partner violence:    Fear of current or ex partner: No    Emotionally abused: No    Physically abused: No    Forced sexual  activity: No  Other Topics Concern  . Not on file  Social History Narrative  . Not on file     Current Outpatient Medications:  .  ALPRAZolam (XANAX) 0.25 MG tablet, Take 0.25 mg by mouth 2 (two) times daily as needed for anxiety., Disp: , Rfl:  .  DiphenhydrAMINE HCl (BENADRYL ALLERGY PO), Take 1 tablet by mouth 3 (three) times daily., Disp: , Rfl:  .  lisinopril (ZESTRIL) 20 MG tablet, Take 1 tablet by mouth once daily, Disp: 90 tablet, Rfl: 0 .  umeclidinium-vilanterol (ANORO ELLIPTA) 62.5-25 MCG/INH AEPB, Inhale 1 puff into the lungs daily., Disp: 30 each, Rfl: 1 .  Guaifenesin-Codeine (GUAIFENESIN AC PO), Take 1 tablet by mouth 3 (three) times daily., Disp: , Rfl:   Allergies  Allergen Reactions  . Peanut Oil Anaphylaxis  . Peanut-Containing Drug Products Anaphylaxis  . Penicillins Hives    Other reaction(s): Anaphylaxis Throat swelling  . Tetanus Toxoids     Other reaction(s): Swelling  . Buspar [Buspirone] Other (See Comments)    Motion sickness  . Flexeril [Cyclobenzaprine] Other (See Comments), Nausea Only and Nausea And Vomiting    hallucinations  . Gabapentin Nausea And Vomiting  . Methocarbamol Nausea Only  . Morphine And Related   . Prednisone     Other reaction(s): Hallucination hallucinations hallucinations    I personally reviewed active problem list, medication list, allergies, notes from last encounter, lab results with the patient/caregiver today.   ROS  Constitutional: Negative for fever or weight change.  Respiratory: Negative for cough. See HPI Cardiovascular: Negative for chest pain or palpitations.  Gastrointestinal: Negative for abdominal pain, no bowel changes.  Musculoskeletal: Negative for gait problem or joint swelling.  Skin: Negative for rash.  See HPI Neurological: Negative for dizziness or headache.  No other specific complaints in a complete review of systems (except as listed in HPI above).  Objective  Vitals:   03/09/19 1131  03/09/19 1252  BP:  138/76  Pulse: 98   Resp: 18   Temp: 97.7 F (36.5 C)   TempSrc: Oral   SpO2: 99%   Weight: (!) 313 lb 9.6 oz (142.2 kg)   Height: 5\' 11"  (1.803 m)    Body mass index is 43.74 kg/m.  Physical Exam  Constitutional: Patient appears well-developed and well-nourished. No distress.  HENT: Head: Normocephalic and atraumatic. Eyes: Conjunctivae and EOM are normal. No scleral icterus. Neck: Normal range of motion. Neck supple. No JVD present. No thyromegaly present.  Cardiovascular: Normal rate, regular rhythm and normal heart sounds.  No murmur heard. No BLE edema. Pulmonary/Chest: Effort normal and breath sounds normal. No respiratory distress.  Musculoskeletal: Normal range of motion, no joint effusions. No gross deformities Neurological: Pt is alert and oriented to person, place, and time. No cranial nerve deficit. Coordination, balance, strength, speech and gait are normal.  Skin: Skin is warm and dry. No rash noted. No erythema. There is skin tag to the bilateral axilla.  The skin on the bilateral cheeks exhibits small patches of hyperpigementation Psychiatric: Patient has a normal mood and affect. behavior is normal. Judgment and thought content normal.  No results found for this or any previous visit (from the past 72 hour(s)).  PHQ2/9: Depression screen PHQ 2/9 03/09/2019  Decreased Interest 0  Down, Depressed, Hopeless 3  PHQ - 2 Score 3  Altered sleeping 3  Tired, decreased energy 3  Change in appetite 3  Feeling bad or failure about yourself  3  Trouble concentrating 3  Moving slowly or fidgety/restless 3  Suicidal thoughts 3  PHQ-9 Score 24  Difficult doing work/chores Extremely dIfficult   PHQ-2/9 Result is positive.    Fall Risk: Fall Risk  03/09/2019  Falls in the past year? 1  Number falls in past yr: 1  Injury with Fall? 1  Follow up Falls evaluation completed   Assessment & Plan  1. Complex regional pain syndrome i of right lower  limb -  Continue with specialist  2. Chronic midline low back pain with bilateral sciatica - Continue with specialist  3. Essential hypertension - COMPLETE METABOLIC PANEL WITH GFR - Continue Lisinopril at this time  4. Moderate persistent asthma without complication - Taking Anoro and this seems to benefit him so we will maintain  5. Skin tag - COMPLETE METABOLIC PANEL WITH GFR - Hemoglobin A1c  6. Acanthosis nigricans - COMPLETE METABOLIC PANEL WITH GFR - Hemoglobin A1c  7. Multiple allergies - Noted in chart  8. BMI 40.0-44.9, adult [facility]) - Discussed importance of 150 minutes of physical activity weekly, eat two servings of fish weekly, eat one serving of tree nuts ( cashews, pistachios, pecans, almonds.Marland Kitchen) every other day, eat 6 servings of fruit/vegetables daily and drink plenty of water and avoid sweet beverages.  - CBC with Differential/Platelet - COMPLETE METABOLIC PANEL WITH GFR - Hemoglobin A1c - Lipid panel - TSH  9. Bipolar disorder, current episode mixed, moderate ([place]) - Continue with specialist.  10. Panic disorder - Continue with specialist.  11. PTSD (post-traumatic stress disorder) - Continue with specialist.  12. Encounter for colorectal cancer screening - Ambulatory referral to Gastroenterology  13. Leg cramping - Suspect to be a part of CRPS - COMPLETE METABOLIC PANEL WITH GFR - Magnesium  14. Need for hepatitis C screening test - Hepatitis C antibody  15. Encounter for screening for HIV - HIV Antibody (routine testing w rflx)

## 2019-03-10 ENCOUNTER — Other Ambulatory Visit: Payer: Self-pay

## 2019-03-10 ENCOUNTER — Telehealth: Payer: Self-pay

## 2019-03-10 ENCOUNTER — Other Ambulatory Visit: Payer: Self-pay | Admitting: Family Medicine

## 2019-03-10 DIAGNOSIS — Z800 Family history of malignant neoplasm of digestive organs: Secondary | ICD-10-CM

## 2019-03-10 DIAGNOSIS — E782 Mixed hyperlipidemia: Secondary | ICD-10-CM

## 2019-03-10 DIAGNOSIS — E781 Pure hyperglyceridemia: Secondary | ICD-10-CM

## 2019-03-10 DIAGNOSIS — Z1211 Encounter for screening for malignant neoplasm of colon: Secondary | ICD-10-CM

## 2019-03-10 DIAGNOSIS — R7303 Prediabetes: Secondary | ICD-10-CM

## 2019-03-10 MED ORDER — ROSUVASTATIN CALCIUM 20 MG PO TABS: 20.0000 mg | ORAL_TABLET | Freq: Every day | ORAL | 3 refills | Status: DC

## 2019-03-10 MED ORDER — METFORMIN HCL ER 750 MG PO TB24: ORAL_TABLET | ORAL | 0 refills | Status: DC

## 2019-03-10 MED ORDER — ICOSAPENT ETHYL 1 G PO CAPS: 2.0000 g | ORAL_CAPSULE | Freq: Two times a day (BID) | ORAL | 2 refills | Status: DC

## 2019-03-10 NOTE — Telephone Encounter (Signed)
Gastroenterology Pre-Procedure Review  Request Date: 03/23/19 Requesting Physician: Dr. Vicente Males  PATIENT REVIEW QUESTIONS: The patient responded to the following health history questions as indicated:    1. Are you having any GI issues? no 2. Do you have a personal history of Polyps? no 3. Do you have a family history of Colon Cancer or Polyps? yes (sister colon cancer) 4. Diabetes Mellitus? no 5. Joint replacements in the past 12 months?no 6. Major health problems in the past 3 months?no 7. Any artificial heart valves, MVP, or defibrillator?no    MEDICATIONS & ALLERGIES:    Patient reports the following regarding taking any anticoagulation/antiplatelet therapy:   Plavix, Coumadin, Eliquis, Xarelto, Lovenox, Pradaxa, Brilinta, or Effient? no Aspirin? no  Patient confirms/reports the following medications:  Current Outpatient Medications  Medication Sig Dispense Refill  . ALPRAZolam (XANAX) 0.25 MG tablet Take 0.25 mg by mouth 2 (two) times daily as needed for anxiety.    . DiphenhydrAMINE HCl (BENADRYL ALLERGY PO) Take 1 tablet by mouth 3 (three) times daily.    . Guaifenesin-Codeine (GUAIFENESIN AC PO) Take 1 tablet by mouth 3 (three) times daily.    Marland Kitchen lisinopril (ZESTRIL) 20 MG tablet Take 1 tablet by mouth once daily 90 tablet 0  . umeclidinium-vilanterol (ANORO ELLIPTA) 62.5-25 MCG/INH AEPB Inhale 1 puff into the lungs daily. 30 each 1   No current facility-administered medications for this visit.     Patient confirms/reports the following allergies:  Allergies  Allergen Reactions  . Peanut Oil Anaphylaxis  . Peanut-Containing Drug Products Anaphylaxis  . Penicillins Hives    Other reaction(s): Anaphylaxis Throat swelling  . Tetanus Toxoids     Other reaction(s): Swelling  . Buspar [Buspirone] Other (See Comments)    Motion sickness  . Flexeril [Cyclobenzaprine] Other (See Comments), Nausea Only and Nausea And Vomiting    hallucinations  . Gabapentin Nausea And Vomiting   . Methocarbamol Nausea Only  . Morphine And Related   . Prednisone     Other reaction(s): Hallucination hallucinations hallucinations    No orders of the defined types were placed in this encounter.   AUTHORIZATION INFORMATION Primary Insurance: 1D#: Group #:  Secondary Insurance: 1D#: Group #:  SCHEDULE INFORMATION: Date: 03/23/19 Time: Location:  [place]

## 2019-03-11 ENCOUNTER — Telehealth: Payer: Self-pay | Admitting: Family Medicine

## 2019-03-11 NOTE — Telephone Encounter (Signed)
Copied from [place] [phone] [phone]. Topic: Quick Communication - Rx Refill/Question >> Mar 11, 2019 10:57 AM Burchel, Abbi R wrote: Medication: lisinopril (ZESTRIL) 20 MG tablet  Pt is experiencing side effects and would like to have a call from the nurse to discuss other options. Pt states he spoke with someone about this during his visit on 03/09/2019, but was unsure what to do next. Please call [phone]

## 2019-03-16 ENCOUNTER — Telehealth: Payer: Self-pay | Admitting: Gastroenterology

## 2019-03-16 NOTE — Telephone Encounter (Signed)
Pt is calling stating he received a letter with a possible amount he could get billed from his procedure he states he would like to cancel the procedure because he is on a low income. I informed pt to conact his insurance to see what would be covered he states it would be [facility] responsibility to conact his insurance because we the ones who would get paid. Pt states to cancel procedure and hung up

## 2019-03-17 ENCOUNTER — Ambulatory Visit (INDEPENDENT_AMBULATORY_CARE_PROVIDER_SITE_OTHER): Payer: Medicaid Other | Admitting: Family Medicine

## 2019-03-17 ENCOUNTER — Other Ambulatory Visit: Payer: Self-pay

## 2019-03-17 ENCOUNTER — Encounter: Payer: Self-pay | Admitting: Family Medicine

## 2019-03-17 DIAGNOSIS — I10 Essential (primary) hypertension: Secondary | ICD-10-CM

## 2019-03-17 DIAGNOSIS — R05 Cough: Secondary | ICD-10-CM | POA: Diagnosis not present

## 2019-03-17 DIAGNOSIS — Z6841 Body Mass Index (BMI) 40.0 and over, adult: Secondary | ICD-10-CM

## 2019-03-17 DIAGNOSIS — F609 Personality disorder, unspecified: Secondary | ICD-10-CM | POA: Insufficient documentation

## 2019-03-17 DIAGNOSIS — Z789 Other specified health status: Secondary | ICD-10-CM | POA: Diagnosis not present

## 2019-03-17 DIAGNOSIS — R252 Cramp and spasm: Secondary | ICD-10-CM

## 2019-03-17 DIAGNOSIS — R058 Other specified cough: Secondary | ICD-10-CM

## 2019-03-17 DIAGNOSIS — F988 Other specified behavioral and emotional disorders with onset usually occurring in childhood and adolescence: Secondary | ICD-10-CM | POA: Insufficient documentation

## 2019-03-17 DIAGNOSIS — E781 Pure hyperglyceridemia: Secondary | ICD-10-CM

## 2019-03-17 DIAGNOSIS — F3162 Bipolar disorder, current episode mixed, moderate: Secondary | ICD-10-CM | POA: Diagnosis not present

## 2019-03-17 MED ORDER — AMLODIPINE BESYLATE 2.5 MG PO TABS: 2.5000 mg | ORAL_TABLET | Freq: Every day | ORAL | 3 refills | Status: DC

## 2019-03-17 NOTE — Telephone Encounter (Signed)
Patients colonoscopy has been canceled as he requested.  Thanks [employer]

## 2019-03-17 NOTE — Progress Notes (Signed)
Name: Ronald Lucas   MRN: [id number]    DOB: 1961-05-24   Date:03/17/2019       Progress Note  Subjective  Chief Complaint  Chief Complaint  Patient presents with  . Follow-up    Has alot of allergies to medication and states the lisinopril has caused these reactions.  . Hypertension    Only has been taking half of the Lisinopril all over his back, thighs, calves, feet and toes. Has been going on for years. SOB and     I connected with  Ronald Lucas on 03/17/19 at 10:20 AM EDT by telephone and verified that I am speaking with the correct person using two identifiers.  I discussed the limitations, risks, security and privacy concerns of performing an evaluation and management service by telephone and the availability of in person appointments. Staff also discussed with the patient that there may be a patient responsible charge related to this service. Patient Location: at home  Provider Location:  at [facility]  HPI  HTN: he states he stopped medication, because he states he has a dry cough, also cramps that is much worse since he started ACE over the past 7 years. He states it is very frequent and would like to change medication. He has taken clonidine , he has allergy to sulfa drugs, never tried norvasc in the past. We will try medication  Dyslipidemia: last triglycerides was 559, however he states he had two bacon and egg biscuits and a Dr. Malachi Bonds that morning and would like to have labs repeated before starting on medication. Explained risk of pancreatitis with very high levels, but it can be inaccurate and we will recheck it. \  Pre-diabetes last A1C: 6.4%, he does not want to take medication, discussed diabetic diet    Bipolar Disorder: he is under the care of Dr. Tamera Punt, only on Alprazolam   Morbid obesity: he eats fast food, a lot of pasta and bread   Patient Active Problem List   Diagnosis Date Noted  . Asthma 05/09/2018  . PTSD  (post-traumatic stress disorder) 05/09/2018  . Chronic midline low back pain with bilateral sciatica 07/19/2017  . Skin tag 07/19/2017  . Shortness of breath 07/19/2017  . Wheezing 07/19/2017  . Multiple allergies 07/19/2017  . Bipolar disorder, current episode mixed, moderate ([place]) 07/19/2017  . Panic disorder 07/19/2017  . BMI 40.0-44.9, adult ([place]) 07/19/2017  . Vascular calcification 07/19/2017  . Complex regional pain syndrome i of right lower limb 01/18/2016  . Dupuytren's contracture 01/18/2016  . Hypertension 05/28/2011    Past Surgical History:  Procedure Laterality Date  . CARDIAC SURGERY    . FRACTURE SURGERY      History reviewed. No pertinent family history.  Social History   Socioeconomic History  . Marital status: Single    Spouse name: Not on file  . Number of children: 0  . Years of education: Not on file  . Highest education level: Not on file  Occupational History  . Occupation: disabled  Social Needs  . Financial resource strain: Not hard at all  . Food insecurity    Worry: Never true    Inability: Never true  . Transportation needs    Medical: No    Non-medical: No  Tobacco Use  . Smoking status: Current Every Day Smoker    Packs/day: 0.10    Types: Cigarettes  . Smokeless tobacco: Never Used  Substance and Sexual Activity  . Alcohol use: No  .  Drug use: No  . Sexual activity: Not Currently  Lifestyle  . Physical activity    Days per week: 0 days    Minutes per session: 0 min  . Stress: Only a little  Relationships  . Social Herbalist on phone: Twice a week    Gets together: Twice a week    Attends religious service: Never    Active member of club or organization: No    Attends meetings of clubs or organizations: Never    Relationship status: Never married  . Intimate partner violence    Fear of current or ex partner: No    Emotionally abused: No    Physically abused: No    Forced sexual activity: No  Other Topics  Concern  . Not on file  Social History Narrative  . Not on file     Current Outpatient Medications:  .  ALPRAZolam (XANAX) 0.25 MG tablet, Take 0.25 mg by mouth 2 (two) times daily as needed for anxiety., Disp: , Rfl:  .  DiphenhydrAMINE HCl (BENADRYL ALLERGY PO), Take 1 tablet by mouth 3 (three) times daily., Disp: , Rfl:  .  lisinopril (ZESTRIL) 20 MG tablet, Take 1 tablet by mouth once daily (Patient taking differently: Take 10 mg by mouth daily. ), Disp: 90 tablet, Rfl: 0 .  umeclidinium-vilanterol (ANORO ELLIPTA) 62.5-25 MCG/INH AEPB, Inhale 1 puff into the lungs daily., Disp: 30 each, Rfl: 1 .  Guaifenesin-Codeine (GUAIFENESIN AC PO), Take 1 tablet by mouth 3 (three) times daily., Disp: , Rfl:  .  Icosapent Ethyl 1 g CAPS, Take 2 capsules (2 g total) by mouth 2 (two) times a day. (Patient not taking: Reported on 03/17/2019), Disp: 120 capsule, Rfl: 2 .  metFORMIN (GLUCOPHAGE XR) 750 MG 24 hr tablet, Take 1 tablet once daily with breakfast x7 days, then increase to 2 tablets once daily with breakfast. (Patient not taking: Reported on 03/17/2019), Disp: 180 tablet, Rfl: 0 .  rosuvastatin (CRESTOR) 20 MG tablet, Take 1 tablet (20 mg total) by mouth daily. (Patient not taking: Reported on 03/17/2019), Disp: 90 tablet, Rfl: 3  Allergies  Allergen Reactions  . Peanut Oil Anaphylaxis  . Peanut-Containing Drug Products Anaphylaxis  . Penicillins Hives    Other reaction(s): Anaphylaxis Throat swelling  . Tetanus Toxoids     Other reaction(s): Swelling  . Buspar [Buspirone] Other (See Comments)    Motion sickness  . Flexeril [Cyclobenzaprine] Other (See Comments), Nausea Only and Nausea And Vomiting    hallucinations  . Gabapentin Nausea And Vomiting  . Methocarbamol Nausea Only  . Morphine And Related Other (See Comments)    Gets violent if takes Morphine   . Prednisone     Other reaction(s): Hallucination hallucinations hallucinations    I personally reviewed active problem list,  medication list, allergies, family history, social history with the patient/caregiver today.   ROS  Ten systems reviewed and is negative except as mentioned in HPI   Objective  Virtual encounter, vitals not obtained.  There is no height or weight on file to calculate BMI.  Physical Exam  Awake, alert and seems angry and frustrated but calmed down towards the end of the visit  PHQ2/9: Depression screen [facility] 2/9 03/17/2019 03/09/2019  Decreased Interest 0 0  Down, Depressed, Hopeless 3 3  PHQ - 2 Score 3 3  Altered sleeping 3 3  Tired, decreased energy 3 3  Change in appetite 3 3  Feeling bad or failure about yourself  3 3  Trouble concentrating 3 3  Moving slowly or fidgety/restless 3 3  Suicidal thoughts 3 3  PHQ-9 Score 24 24  Difficult doing work/chores Extremely dIfficult Extremely dIfficult   PHQ-2/9 Result is positive.    Fall Risk: Fall Risk  03/17/2019 03/09/2019  Falls in the past year? 1 1  Number falls in past yr: 1 1  Injury with Fall? 1 1  Follow up - Falls evaluation completed     Assessment & Plan  1. Muscle cramps  We will stop ace  2. Dry cough  Stop ace  3. ACE inhibitor intolerance  Change to Norvasc  4. BMI 40.0-44.9, adult [facility])  Discussed with the patient the risk posed by an increased BMI. Discussed importance of portion control, calorie counting and at least 150 minutes of physical activity weekly. Avoid sweet beverages and drink more water. Eat at least 6 servings of fruit and vegetables daily   5. Bipolar disorder, current episode mixed, moderate ([place])  Under the care of Dr. Tamera Punt  6. 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 Tamera Punt  7. Hypertriglyceridemia  Does not want to take medication, discussed diet and recheck levels fasting in September  8. Essential hypertension  - amLODipine (NORVASC) 2.5 MG tablet; Take 1 tablet (2.5 mg total) by mouth daily.  Dispense: 90 tablet; Refill: 3   I discussed  the assessment and treatment plan with the patient. The patient was provided an opportunity to ask questions and all were answered. The patient agreed with the plan and demonstrated an understanding of the instructions.   The patient was advised to call back or seek an in-person evaluation if the symptoms worsen or if the condition fails to improve as anticipated.  I provided 15 minutes of non-face-to-face time during this encounter.  Loistine Chance, MD

## 2019-03-20 ENCOUNTER — Telehealth: Payer: Self-pay | Admitting: Family Medicine

## 2019-03-20 NOTE — Telephone Encounter (Signed)
Pt is calling on status - he would like call when pa is done. Pt is out of meds

## 2019-03-20 NOTE — Telephone Encounter (Signed)
Copied from [place] [id number]. Topic: General - Other >> Mar 19, 2019  4:46 PM Mcneil, Ja-Kwan wrote: Reason for CRM:Pt stated the Rx for umeclidinium-vilanterol (ANORO ELLIPTA) 62.5-25 MCG/INH AEPB needs prior authorization and his pharmacy advised him that they sent the PA but has not received a response. Pt stated he is completely out and needs the Rx to be filled.

## 2019-03-20 NOTE — Telephone Encounter (Signed)
PA has been performed on [facility] - waiting for a response.

## 2019-03-21 LAB — COMPLETE METABOLIC PANEL WITH GFR
AG Ratio: 1.6 (calc) (ref 1.0–2.5)
ALT: 32 U/L (ref 9–46)
AST: 25 U/L (ref 10–35)
Albumin: 4.4 g/dL (ref 3.6–5.1)
Alkaline phosphatase (APISO): 46 U/L (ref 35–144)
BUN: 14 mg/dL (ref 7–25)
CO2: 21 mmol/L (ref 20–32)
Calcium: 9.4 mg/dL (ref 8.6–10.3)
Chloride: 105 mmol/L (ref 98–110)
Creat: 1.32 mg/dL (ref 0.70–1.33)
GFR, Est African American: 69 mL/min/{1.73_m2} (ref >=60)
GFR, Est Non African American: 59 mL/min/{1.73_m2} — ABNORMAL LOW (ref >=60)
Globulin: 2.8 g/dL (calc) (ref 1.9–3.7)
Glucose, Bld: 142 mg/dL — ABNORMAL HIGH (ref 65–99)
Potassium: 4.8 mmol/L (ref 3.5–5.3)
Sodium: 136 mmol/L (ref 135–146)
Total Bilirubin: 0.3 mg/dL (ref 0.2–1.2)
Total Protein: 7.2 g/dL (ref 6.1–8.1)

## 2019-03-21 LAB — HEPATITIS C ANTIBODY
Hepatitis C Ab: REACTIVE — AB
SIGNAL TO CUT-OFF: 1.95 — ABNORMAL HIGH (ref <1.00)

## 2019-03-21 LAB — CBC WITH DIFFERENTIAL/PLATELET
Absolute Monocytes: 598 cells/uL (ref 200–950)
Basophils Absolute: 108 cells/uL (ref 0–200)
Basophils Relative: 1.5 %
Eosinophils Absolute: 252 cells/uL (ref 15–500)
Eosinophils Relative: 3.5 %
HCT: 45.2 % (ref 38.5–50.0)
Hemoglobin: 15.7 g/dL (ref 13.2–17.1)
Lymphs Abs: 2714 cells/uL (ref 850–3900)
MCH: 30.5 pg (ref 27.0–33.0)
MCHC: 34.7 g/dL (ref 32.0–36.0)
MCV: 87.9 fL (ref 80.0–100.0)
MPV: 9.9 fL (ref 7.5–12.5)
Monocytes Relative: 8.3 %
Neutro Abs: 3528 cells/uL (ref 1500–7800)
Neutrophils Relative %: 49 %
Platelets: 249 10*3/uL (ref 140–400)
RBC: 5.14 10*6/uL (ref 4.20–5.80)
RDW: 13.2 % (ref 11.0–15.0)
Total Lymphocyte: 37.7 %
WBC: 7.2 10*3/uL (ref 3.8–10.8)

## 2019-03-21 LAB — HIV ANTIBODY (ROUTINE TESTING W REFLEX): HIV 1&2 Ab, 4th Generation: NONREACTIVE

## 2019-03-21 LAB — TSH: TSH: 2.3 mIU/L (ref 0.40–4.50)

## 2019-03-21 LAB — LIPID PANEL
Cholesterol: 197 mg/dL (ref <200)
HDL: 29 mg/dL — ABNORMAL LOW (ref >=40)
Non-HDL Cholesterol (Calc): 168 mg/dL (calc) — ABNORMAL HIGH (ref <130)
Total CHOL/HDL Ratio: 6.8 (calc) — ABNORMAL HIGH (ref <5.0)
Triglycerides: 559 mg/dL — ABNORMAL HIGH (ref <150)

## 2019-03-21 LAB — HEMOGLOBIN A1C
Hgb A1c MFr Bld: 6.4 % of total Hgb — ABNORMAL HIGH (ref <5.7)
Mean Plasma Glucose: 137 (calc)
eAG (mmol/L): 7.6 (calc)

## 2019-03-21 LAB — HCV RNA,QUANTITATIVE REAL TIME PCR
HCV Quantitative Log: <1.18 Log IU/mL
HCV RNA, PCR, QN: <15 IU/mL

## 2019-03-21 LAB — MAGNESIUM: Magnesium: 1.9 mg/dL (ref 1.5–2.5)

## 2019-03-23 ENCOUNTER — Ambulatory Visit: Admit: 2019-03-23 | Payer: Medicaid Other | Admitting: Gastroenterology

## 2019-03-23 DIAGNOSIS — F410 Panic disorder [episodic paroxysmal anxiety] without agoraphobia: Secondary | ICD-10-CM | POA: Diagnosis not present

## 2019-03-23 SURGERY — COLONOSCOPY WITH PROPOFOL
Anesthesia: General

## 2019-03-24 ENCOUNTER — Telehealth: Payer: Self-pay | Admitting: Family Medicine

## 2019-03-24 ENCOUNTER — Ambulatory Visit: Payer: Self-pay

## 2019-03-24 NOTE — Telephone Encounter (Signed)
Symptoms went away after stopping amlodipine. Still having sob, cough, congestion felt like he was having a asthma attack. Cannot afford to go to [facility]. He is on a fixed income

## 2019-03-24 NOTE — Telephone Encounter (Signed)
  Pt called stating that he can not take his Amlodipine. He says he spoke to a pharmacist who told him to stop the medication. He stopped yesterday. He states his symptoms are cough wheezing and SOB. He feels as though he is having asthma. He denies hives itching, trouble swallowing. Per office note will be routed to office for advice. Reason for Disposition . Caller has NON-URGENT medication question about med that PCP prescribed and triager unable to answer question  Answer Assessment - Initial Assessment Questions 1. SYMPTOMS: "Do you have any symptoms?"     Wheezing, anxiety 2. SEVERITY: If symptoms are present, ask "Are they mild, moderate or severe?"    Cough wheezing congestion  Protocols used: MEDICATION QUESTION CALL-A-AH

## 2019-03-24 NOTE — Telephone Encounter (Signed)
Pt called back and stated that he is waiting on a return call. Stated that he cannot go to [facility] and that he does not have covid. That his body system is complicated. Stated that he is not going to take amlodipine that he feels so much better with it getting out of his system. However he is again asking for a return call. Check previous message that was routed from [place].

## 2019-03-25 ENCOUNTER — Encounter: Payer: Self-pay | Admitting: Family Medicine

## 2019-03-25 ENCOUNTER — Other Ambulatory Visit: Payer: Self-pay | Admitting: Family Medicine

## 2019-03-25 DIAGNOSIS — I10 Essential (primary) hypertension: Secondary | ICD-10-CM

## 2019-03-25 MED ORDER — HYDROCHLOROTHIAZIDE 12.5 MG PO TABS: 12.5000 mg | ORAL_TABLET | Freq: Every day | ORAL | 0 refills | Status: DC

## 2019-03-25 NOTE — Telephone Encounter (Signed)
Patient states he has tons of allergies to medications-he had a allergy test back done in the 1990s and does not have this list any more. Lisinopril cause his to have muscle cramps and Amlodipine caused SOB, Cough, and Congestion immediately after taking his first pill. So right now he is not on Amlodipine anymore either since his symptoms resolved after not taking it again. Please advise. He states he would not find having another allergy test done to have a updated list.

## 2019-03-25 NOTE — Telephone Encounter (Signed)
Patient notified and he states he will give HCTZ a try and see how he does on it.

## 2019-03-27 ENCOUNTER — Telehealth: Payer: Self-pay

## 2019-03-27 NOTE — Telephone Encounter (Signed)
[facility] faxed use that Metformin 750 mg was on recall and they suggest Regular Release Metformin 500 or the 1000 mg dosage.

## 2019-03-30 ENCOUNTER — Other Ambulatory Visit: Payer: Self-pay | Admitting: Family Medicine

## 2019-03-30 MED ORDER — METFORMIN HCL ER 500 MG PO TB24: 1500.0000 mg | ORAL_TABLET | Freq: Every day | ORAL | 0 refills | Status: DC

## 2019-03-30 NOTE — Addendum Note (Signed)
Addended by: Steele Sizer F on: 03/30/2019 01:25 PM   Modules accepted: Orders

## 2019-04-16 ENCOUNTER — Telehealth: Payer: Self-pay

## 2019-04-16 NOTE — Telephone Encounter (Signed)
Onset-this morning week he started experiencing feeling like he is burning up and like he is about to caught on fire, but his temperature has been 96 F. Mouth dryness even after drinking a cup of water. Feels like he is in a complete pain attack. His heart is racing and BP monitor has been registering high 220/155 Pulse rate of 92.  Patient states the pharmacist recommended you not to take hydrochlorothiazide due to making his veins collapse. The pharmacist states HCTZ is a sulfur related drug and he has had reactions in the past. So he is back on taking Lisinopril.   Due to symptoms informed patient to go to Urgent Care.

## 2019-04-21 DIAGNOSIS — F410 Panic disorder [episodic paroxysmal anxiety] without agoraphobia: Secondary | ICD-10-CM | POA: Diagnosis not present

## 2019-05-20 ENCOUNTER — Telehealth: Payer: Self-pay | Admitting: Nurse Practitioner

## 2019-05-20 ENCOUNTER — Other Ambulatory Visit: Payer: Self-pay | Admitting: Family Medicine

## 2019-05-20 DIAGNOSIS — R0602 Shortness of breath: Secondary | ICD-10-CM

## 2019-05-20 DIAGNOSIS — R062 Wheezing: Secondary | ICD-10-CM

## 2019-05-20 MED ORDER — ANORO ELLIPTA 62.5-25 MCG/INH IN AEPB: 1.0000 | INHALATION_SPRAY | Freq: Every day | RESPIRATORY_TRACT | 0 refills | Status: DC

## 2019-05-21 NOTE — Telephone Encounter (Signed)
Pt is scheduled °

## 2019-05-24 ENCOUNTER — Emergency Department: Payer: Medicaid Other

## 2019-05-24 ENCOUNTER — Other Ambulatory Visit: Payer: Self-pay

## 2019-05-24 ENCOUNTER — Emergency Department
Admission: EM | Admit: 2019-05-24 | Discharge: 2019-05-24 | Disposition: A | Discharge status: Home / Self Care | Payer: Medicaid Other | Attending: Emergency Medicine | Admitting: Emergency Medicine

## 2019-05-24 DIAGNOSIS — M545 Low back pain: Secondary | ICD-10-CM | POA: Insufficient documentation

## 2019-05-24 DIAGNOSIS — R457 State of emotional shock and stress, unspecified: Secondary | ICD-10-CM | POA: Diagnosis not present

## 2019-05-24 DIAGNOSIS — F17210 Nicotine dependence, cigarettes, uncomplicated: Secondary | ICD-10-CM | POA: Insufficient documentation

## 2019-05-24 DIAGNOSIS — J80 Acute respiratory distress syndrome: Secondary | ICD-10-CM | POA: Diagnosis not present

## 2019-05-24 DIAGNOSIS — R0602 Shortness of breath: Secondary | ICD-10-CM | POA: Diagnosis not present

## 2019-05-24 DIAGNOSIS — J45909 Unspecified asthma, uncomplicated: Secondary | ICD-10-CM | POA: Diagnosis not present

## 2019-05-24 DIAGNOSIS — I10 Essential (primary) hypertension: Secondary | ICD-10-CM | POA: Insufficient documentation

## 2019-05-24 DIAGNOSIS — R0600 Dyspnea, unspecified: Secondary | ICD-10-CM | POA: Diagnosis not present

## 2019-05-24 DIAGNOSIS — R52 Pain, unspecified: Secondary | ICD-10-CM | POA: Diagnosis not present

## 2019-05-24 DIAGNOSIS — R1310 Dysphagia, unspecified: Secondary | ICD-10-CM | POA: Diagnosis not present

## 2019-05-24 LAB — CBC WITH DIFFERENTIAL/PLATELET
Abs Immature Granulocytes: 0.07 10*3/uL (ref 0.00–0.07)
Basophils Absolute: 0.1 10*3/uL (ref 0.0–0.1)
Basophils Relative: 2 %
Eosinophils Absolute: 0.2 10*3/uL (ref 0.0–0.5)
Eosinophils Relative: 3 %
HCT: 42.2 % (ref 39.0–52.0)
Hemoglobin: 14.4 g/dL (ref 13.0–17.0)
Immature Granulocytes: 1 %
Lymphocytes Relative: 35 %
Lymphs Abs: 2.6 10*3/uL (ref 0.7–4.0)
MCH: 29.5 pg (ref 26.0–34.0)
MCHC: 34.1 g/dL (ref 30.0–36.0)
MCV: 86.5 fL (ref 80.0–100.0)
Monocytes Absolute: 0.6 10*3/uL (ref 0.1–1.0)
Monocytes Relative: 8 %
Neutro Abs: 3.9 10*3/uL (ref 1.7–7.7)
Neutrophils Relative %: 51 %
Platelets: 232 10*3/uL (ref 150–400)
RBC: 4.88 MIL/uL (ref 4.22–5.81)
RDW: 12.7 % (ref 11.5–15.5)
WBC: 7.5 10*3/uL (ref 4.0–10.5)
nRBC: 0 % (ref 0.0–0.2)

## 2019-05-24 LAB — BASIC METABOLIC PANEL
Anion gap: 11 (ref 5–15)
BUN: 15 mg/dL (ref 6–20)
CO2: 24 mmol/L (ref 22–32)
Calcium: 9 mg/dL (ref 8.9–10.3)
Chloride: 103 mmol/L (ref 98–111)
Creatinine, Ser: 1.17 mg/dL (ref 0.61–1.24)
GFR calc Af Amer: >60 mL/min (ref >60)
GFR calc non Af Amer: >60 mL/min (ref >60)
Glucose, Bld: 143 mg/dL — ABNORMAL HIGH (ref 70–99)
Potassium: 4.6 mmol/L (ref 3.5–5.1)
Sodium: 138 mmol/L (ref 135–145)

## 2019-05-24 LAB — BRAIN NATRIURETIC PEPTIDE: B Natriuretic Peptide: 18 pg/mL (ref 0.0–100.0)

## 2019-05-24 LAB — TROPONIN I (HIGH SENSITIVITY): Troponin I (High Sensitivity): 8 ng/L (ref <18)

## 2019-05-24 NOTE — ED Triage Notes (Signed)
Pt arrives at ED from home via [facility] with c/c of shortness of breath, difficulty swallowing, severe low back pain. Pt states Sx onset last night and have gotten worse as the night progressed. EMS reports transport vitals of 150/88, p80, NSR, O2 sat 96% on room air. Pt has Hx anxiety and reports taking an xanex this morning. Upon arrival, Pt A&Ox4, reports severe low back pain, no respiratory Sx evident. Pt demands to stand at bedside because back can't tolerate laying down.

## 2019-05-24 NOTE — ED Notes (Addendum)
Pt states that he has a history of chronic back pain, his pain began to progress last night. Pt is unable to sit down due to back pain. Pt states that this morning he woke up with a dry mouth, unable to drink anything. Pt states that he believed it was anxiety and took his anxiety medication with no relief.

## 2019-05-24 NOTE — ED Provider Notes (Signed)
[facility] Emergency Department Provider Note       Time seen: ----------------------------------------- 6:52 AM on 05/24/2019 -----------------------------------------  I have reviewed the triage vital signs and the nursing notes.  HISTORY   Chief Complaint Shortness of Breath and Back Pain   HPI Ronald Lucas is a 58 y.o. male with a history of allergies, asthma, bipolar disorder, depression, hypertension, panic attacks who presents to the ED for shortness of breath, difficulty swallowing and low back pain.  Patient states the low back pain is chronic.  Patient states his symptoms got worse as the night progressed.  He reports history of anxiety reports taking a Xanax this morning.  He denies any other acute complaints.  Past Medical History:  Diagnosis Date  . ADHD   . Allergy   . Asthma   . Bipolar 1 disorder ([place])   . Depression   . Hypertension   . Panic attacks   . Panic disorder   . PTSD (post-traumatic stress disorder)     Patient Active Problem List   Diagnosis Date Noted  . ADD (attention deficit disorder) 03/17/2019  . Personality disorder in adult [facility]) 03/17/2019  . Asthma 05/09/2018  . PTSD (post-traumatic stress disorder) 05/09/2018  . Chronic midline low back pain with bilateral sciatica 07/19/2017  . Skin tag 07/19/2017  . Shortness of breath 07/19/2017  . Wheezing 07/19/2017  . Multiple allergies 07/19/2017  . Bipolar disorder, current episode mixed, moderate ([place]) 07/19/2017  . Panic disorder 07/19/2017  . BMI 40.0-44.9, adult ([place]) 07/19/2017  . Vascular calcification 07/19/2017  . Complex regional pain syndrome i of right lower limb 01/18/2016  . Dupuytren's contracture 01/18/2016  . Hypertension 05/28/2011    Past Surgical History:  Procedure Laterality Date  . CARDIAC SURGERY    . FRACTURE SURGERY      Allergies Peanut oil, Peanut-containing drug products, Penicillins, Tetanus toxoids, Buspar [buspirone],  Flexeril [cyclobenzaprine], Gabapentin, Lisinopril, Methocarbamol, Morphine and related, Norvasc [amlodipine besylate], and Prednisone  Social History Social History   Tobacco Use  . Smoking status: Current Every Day Smoker    Packs/day: 0.10    Types: Cigarettes    Start date: 03/17/1983  . Smokeless tobacco: Never Used  Substance Use Topics  . Alcohol use: No  . Drug use: No    Review of Systems Constitutional: Negative for fever. Cardiovascular: Negative for chest pain. Respiratory: Positive for shortness of breath Gastrointestinal: Negative for abdominal pain, vomiting and diarrhea. Musculoskeletal: Positive for back pain Skin: Negative for rash. Neurological: Negative for headaches, focal weakness or numbness.  All systems negative/normal/unremarkable except as stated in the HPI  ____________________________________________   PHYSICAL EXAM:  VITAL SIGNS: ED Triage Vitals  Enc Vitals Group     BP 05/24/19 0638 (!) 168/93     Pulse Rate 05/24/19 0638 77     Resp 05/24/19 0638 14     Temp 05/24/19 0638 98.2 F (36.8 C)     Temp Source 05/24/19 0638 Oral     SpO2 05/24/19 0638 96 %     Weight 05/24/19 0642 300 lb (136.1 kg)     Height 05/24/19 0642 6' (1.829 m)     Head Circumference --      Peak Flow --      Pain Score 05/24/19 0641 10     Pain Loc --      Pain Edu? --      Excl. in [place]? --    Constitutional: Alert and oriented. Well appearing  and in no distress. Eyes: Conjunctivae are normal. Normal extraocular movements. ENT      Head: Normocephalic and atraumatic.      Nose: No congestion/rhinnorhea.      Mouth/Throat: Mucous membranes are moist.      Neck: No stridor. Cardiovascular: Normal rate, regular rhythm. No murmurs, rubs, or gallops. Respiratory: Normal respiratory effort without tachypnea nor retractions. Breath sounds are clear and equal bilaterally. No wheezes/rales/rhonchi. Gastrointestinal: Soft and nontender. Normal bowel  sounds Musculoskeletal: Nontender with normal range of motion in extremities. No lower extremity tenderness nor edema. Neurologic:  Normal speech and language. No gross focal neurologic deficits are appreciated.  Skin:  Skin is warm, dry and intact. No rash noted. Psychiatric: Mood and affect are normal. Speech and behavior are normal.  ____________________________________________  EKG: Interpreted by me.  Sinus rhythm with rate of 77 bpm, normal PR interval, normal QRS, normal QT  ____________________________________________  ED COURSE:  As part of my medical decision making, I reviewed the following data within the [place] History obtained from family if available, nursing notes, old chart and ekg, as well as notes from prior ED visits. Patient presented for dyspnea, we will assess with labs and imaging as indicated at this time.   Procedures  NIR DELAGE was evaluated in Emergency Department on 05/24/2019 for the symptoms described in the history of present illness. He was evaluated in the context of the global COVID-19 pandemic, which necessitated consideration that the patient might be at risk for infection with the SARS-CoV-2 virus that causes COVID-19. Institutional protocols and algorithms that pertain to the evaluation of patients at risk for COVID-19 are in a state of rapid change based on information released by regulatory bodies including the CDC and federal and state organizations. These policies and algorithms were followed during the patient's care in the ED.  ____________________________________________   LABS (pertinent positives/negatives)  Labs Reviewed  BASIC METABOLIC PANEL - Abnormal; Notable for the following components:      Result Value   Glucose, Bld 143 (*)    All other components within normal limits  CBC WITH DIFFERENTIAL/PLATELET  BRAIN NATRIURETIC PEPTIDE  TROPONIN I (HIGH SENSITIVITY)    RADIOLOGY Images were viewed by  me  chest x-ray  IMPRESSION:  Minimal bibasilar atelectasis.  ____________________________________________   DIFFERENTIAL DIAGNOSIS   Anxiety, CHF, COPD, COVID-19, MI  FINAL ASSESSMENT AND PLAN  Dyspnea   Plan: The patient had presented for shortness of breath. Patient's labs were unremarkable although BNP was still pending. Patient's imaging not reveal any acute process.  Patient states his symptoms were resolved and he needed to leave.  He is leaving [place].   Laurence Aly, MD    Note: This note was generated in part or whole with voice recognition software. Voice recognition is usually quite accurate but there are transcription errors that can and very often do occur. I apologize for any typographical errors that were not detected and corrected.     Earleen Newport, MD 05/24/19 [phone]

## 2019-05-24 NOTE — ED Notes (Signed)
Pt called this RN to room stating that the feeling he had come to ED for had passed and he wanted to leave to go be with family member who has cancer. Pt informed that EDP would need to be informed and asked to wait for lab results to come back. Informed EDP. Pt remains adamant on wanting to leave. Informed pt will be leaving AMA. Pt verbalized understanding.

## 2019-06-09 ENCOUNTER — Telehealth: Payer: Self-pay | Admitting: Family Medicine

## 2019-06-09 ENCOUNTER — Other Ambulatory Visit: Payer: Self-pay | Admitting: Nurse Practitioner

## 2019-06-09 ENCOUNTER — Other Ambulatory Visit: Payer: Self-pay | Admitting: Family Medicine

## 2019-06-09 NOTE — Telephone Encounter (Signed)
Pt called regarding refill of lisinopril. Would like a callback as he stated his lisinopril should be current. Please advise.

## 2019-06-09 NOTE — Telephone Encounter (Signed)
lisinopril (ZESTRIL) 20 MG tablet [Pharmacy Med Name: Lisinopril 20 MG Oral Tablet] [id number]  Pt needs to have this refilled the others are not working at all and he is completely out of any.Please give a call back if this is not going to be refilled Urgent!   [facility] [address], [place] - [place] [phone] (Phone) [phone] (Fax)

## 2019-06-09 NOTE — Telephone Encounter (Signed)
Called patient back in regards to Lisinopril per PCP. No answer, left a detailed vm informing him that his last PCP took him off of Lisinopril and he is only taking HCTZ. If he is having bp concerns he needs to come in to be seen or schedule a virtual appt.

## 2019-06-11 ENCOUNTER — Encounter: Payer: Self-pay | Admitting: Family Medicine

## 2019-06-11 ENCOUNTER — Ambulatory Visit (INDEPENDENT_AMBULATORY_CARE_PROVIDER_SITE_OTHER): Payer: Medicaid Other | Admitting: Family Medicine

## 2019-06-11 ENCOUNTER — Other Ambulatory Visit: Payer: Self-pay

## 2019-06-11 DIAGNOSIS — R7303 Prediabetes: Secondary | ICD-10-CM

## 2019-06-11 DIAGNOSIS — F609 Personality disorder, unspecified: Secondary | ICD-10-CM

## 2019-06-11 DIAGNOSIS — F3162 Bipolar disorder, current episode mixed, moderate: Secondary | ICD-10-CM

## 2019-06-11 DIAGNOSIS — J449 Chronic obstructive pulmonary disease, unspecified: Secondary | ICD-10-CM

## 2019-06-11 DIAGNOSIS — I10 Essential (primary) hypertension: Secondary | ICD-10-CM | POA: Diagnosis not present

## 2019-06-11 MED ORDER — ANORO ELLIPTA 62.5-25 MCG/INH IN AEPB: 1.0000 | INHALATION_SPRAY | Freq: Every day | RESPIRATORY_TRACT | 5 refills | Status: DC

## 2019-06-11 MED ORDER — LISINOPRIL 10 MG PO TABS: 10.0000 mg | ORAL_TABLET | Freq: Every day | ORAL | 5 refills | Status: DC

## 2019-06-11 NOTE — Progress Notes (Signed)
Name: Ronald Lucas   MRN: [id number]    DOB: 08-19-1961   Date:06/11/2019       Progress Note  Subjective  Chief Complaint  Chief Complaint  Patient presents with  . Hypertension    I connected with  [name] on 06/11/19 at  9:20 AM EDT by telephone and verified that I am speaking with the correct person using two identifiers.  I discussed the limitations, risks, security and privacy concerns of performing an evaluation and management service by telephone and the availability of in person appointments. Staff also discussed with the patient that there may be a patient responsible charge related to this service. Patient Location: at home  Provider Location: at [facility]  HPI  Recent visit to [facility]: he states he went for an episode of dry mouth, chest pain, chest pain ,  SOB and difficulty swallowing. He took Tums and by the time he left [facility] against medical advise he was feeling well. He states he was nervous because of COVID-19  HTN: bp was very high when he went to [facility] 168/93 but patient states it improved because he went home, he denies current chest pain or palpitation  Bipolar and panic disorder: seeing psychiatrist, Dr. Rodman Key who monitors him, phq 9 is still very high, he states he sees a psychiatrist for his suicidal thoughts but did not want to discuss it with me  Pre-diabetes: A1C was 6.4%, his glucose has been in the 140's range, he refuses to take metformin - he states he is afraid of side effects, he refuses medications in general. Discussed a diabetic diet, he states he is allergic to too many things . Discussed avoiding carbohydrates. He states I don't want to do that, " if I die I die"   COPD/Asthma: he is taking Anoro, still smokes a few cigarettes daily, he did not want to describe his symptoms and refuses flu shot  Chronic Back pain: went to pain clinic " Those people were a freaking joke"  Patient Active Problem List   Diagnosis Date  Noted  . ADD (attention deficit disorder) 03/17/2019  . 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 Asthma 05/09/2018  . PTSD (post-traumatic stress disorder) 05/09/2018  . Chronic midline low back pain with bilateral sciatica 07/19/2017  . Skin tag 07/19/2017  . Shortness of breath 07/19/2017  . Wheezing 07/19/2017  . Multiple allergies 07/19/2017  . Bipolar disorder, current episode mixed, moderate ([place]) 07/19/2017  . Panic disorder 07/19/2017  . BMI 40.0-44.9, adult ([place]) 07/19/2017  . Vascular calcification 07/19/2017  . Complex regional pain syndrome i of right lower limb 01/18/2016  . Dupuytren's contracture 01/18/2016  . Hypertension 05/28/2011    Past Surgical History:  Procedure Laterality Date  . CARDIAC SURGERY    . FRACTURE SURGERY      Family History  Problem Relation Age of Onset  . Colon cancer Sister     Social History   Socioeconomic History  . Marital status: Single    Spouse name: Not on file  . Number of children: 0  . Years of education: Not on file  . Highest education level: Not on file  Occupational History  . Occupation: disabled  Social Needs  . Financial resource strain: Not hard at all  . Food insecurity    Worry: Never true    Inability: Never true  . Transportation needs    Medical: No    Non-medical: No  Tobacco Use  .  Smoking status: Current Every Day Smoker    Packs/day: 0.10    Types: Cigarettes    Start date: 03/17/1983  . Smokeless tobacco: Never Used  Substance and Sexual Activity  . Alcohol use: No  . Drug use: No  . Sexual activity: Not Currently  Lifestyle  . Physical activity    Days per week: 0 days    Minutes per session: 0 min  . Stress: Only a little  Relationships  . Social Herbalist on phone: Twice a week    Gets together: Twice a week    Attends religious service: Never    Active member of club or organization: No    Attends meetings of clubs or organizations: Never    Relationship  status: Never married  . Intimate partner violence    Fear of current or ex partner: No    Emotionally abused: No    Physically abused: No    Forced sexual activity: No  Other Topics Concern  . Not on file  Social History Narrative  . Not on file     Current Outpatient Medications:  .  ALPRAZolam (XANAX) 0.25 MG tablet, Take 0.25 mg by mouth 2 (two) times daily as needed for anxiety., Disp: , Rfl:  .  DiphenhydrAMINE HCl (BENADRYL ALLERGY PO), Take 1 tablet by mouth 3 (three) times daily., Disp: , Rfl:  .  lisinopril (ZESTRIL) 10 MG tablet, Take 1 tablet (10 mg total) by mouth daily. Patient wants to stay on medication, Disp: 30 tablet, Rfl: 5 .  umeclidinium-vilanterol (ANORO ELLIPTA) 62.5-25 MCG/INH AEPB, Inhale 1 puff into the lungs daily., Disp: 30 each, Rfl: 5  Allergies  Allergen Reactions  . Peanut Oil Anaphylaxis  . Peanut-Containing Drug Products Anaphylaxis  . Penicillins Hives    Other reaction(s): Anaphylaxis Throat swelling  . Tetanus Toxoids     Other reaction(s): Swelling  . Buspar [Buspirone] Other (See Comments)    Motion sickness  . Flexeril [Cyclobenzaprine] Other (See Comments), Nausea Only and Nausea And Vomiting    hallucinations  . Gabapentin Nausea And Vomiting  . Lisinopril     Muscle cramp   . Methocarbamol Nausea Only  . Morphine And Related Other (See Comments)    Gets violent if takes Morphine   . Norvasc [Amlodipine Besylate]     Sob, wheezing   . Prednisone     Other reaction(s): Hallucination hallucinations hallucinations    I personally reviewed active problem list, medication list, allergies, family history, social history, health maintenance with the patient/caregiver today.   ROS  Ten systems reviewed and is negative except as mentioned in HPI   Objective  Virtual encounter, vitals not obtained.  There is no height or weight on file to calculate BMI.  Physical Exam  Awake, alert and oriented  PHQ2/9: Depression  screen [facility] 2/9 06/11/2019 03/17/2019 03/09/2019  Decreased Interest 3 0 0  Down, Depressed, Hopeless 3 3 3   PHQ - 2 Score 6 3 3   Altered sleeping 3 3 3   Tired, decreased energy 3 3 3   Change in appetite 3 3 3   Feeling bad or failure about yourself  3 3 3   Trouble concentrating 3 3 3   Moving slowly or fidgety/restless 0 3 3  Suicidal thoughts 3 3 3   PHQ-9 Score 24 24 24   Difficult doing work/chores Extremely dIfficult Extremely dIfficult Extremely dIfficult   PHQ-2/9 Result is positive.    Fall Risk: Fall Risk  06/11/2019 03/17/2019 03/09/2019  Falls in  the past year? 0 1 1  Number falls in past yr: 0 1 1  Injury with Fall? 0 1 1  Follow up - - Falls evaluation completed     Assessment & Plan   1. Essential hypertension  - lisinopril (ZESTRIL) 10 MG tablet; Take 1 tablet (10 mg total) by mouth daily. Patient wants to stay on medication  Dispense: 30 tablet; Refill: 5  2. 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 Prediabetes  He refuses diet and medication, explained that I am here if he wants to be helped  4. Bipolar disorder, current episode mixed, moderate ([place])  Keep follow up with Dr. Tamera Punt   5. COPD with asthma ([place])  - umeclidinium-vilanterol (ANORO ELLIPTA) 62.5-25 MCG/INH AEPB; Inhale 1 puff into the lungs daily.  Dispense: 30 each; Refill: 5  I discussed the assessment and treatment plan with the patient. The patient was provided an opportunity to ask questions and all were answered. The patient agreed with the plan and demonstrated an understanding of the instructions.   The patient was advised to call back or seek an in-person evaluation if the symptoms worsen or if the condition fails to improve as anticipated.  I provided 25  minutes of non-face-to-face time during this encounter.  Loistine Chance, MD

## 2019-06-12 ENCOUNTER — Telehealth: Payer: Self-pay | Admitting: Gastroenterology

## 2019-06-12 NOTE — Telephone Encounter (Signed)
error 

## 2019-07-08 DIAGNOSIS — F410 Panic disorder [episodic paroxysmal anxiety] without agoraphobia: Secondary | ICD-10-CM | POA: Diagnosis not present

## 2019-07-10 ENCOUNTER — Telehealth: Payer: Self-pay

## 2019-07-10 ENCOUNTER — Other Ambulatory Visit: Payer: Self-pay | Admitting: Family Medicine

## 2019-07-10 DIAGNOSIS — R0602 Shortness of breath: Secondary | ICD-10-CM

## 2019-07-10 DIAGNOSIS — Z8249 Family history of ischemic heart disease and other diseases of the circulatory system: Secondary | ICD-10-CM

## 2019-07-10 DIAGNOSIS — R079 Chest pain, unspecified: Secondary | ICD-10-CM

## 2019-07-10 NOTE — Telephone Encounter (Signed)
Called patient. No answer. Left message for patient to call back.

## 2019-07-10 NOTE — Telephone Encounter (Signed)
Copied from [place] [id number]. Topic: General - Inquiry >> Jul 09, 2019  2:09 PM Berneta Levins wrote: Reason for CRM:   Destiny from [facility] calling.  States that Dr. Agapito Games needs to speak directly with Dr. Ancil Boozer regarding this pt.  Per [facility] - Dr. Ancil Boozer is gone for the day. [phone]

## 2019-07-13 NOTE — Telephone Encounter (Signed)
Finally got through to patient. Referrals has been trying to contact him and he keeps missing the call. When he tries to call back no answer. He will continue to try to reach out to get an appointment.

## 2019-07-21 ENCOUNTER — Ambulatory Visit: Payer: Medicaid Other | Admitting: Family Medicine

## 2019-09-02 DIAGNOSIS — F410 Panic disorder [episodic paroxysmal anxiety] without agoraphobia: Secondary | ICD-10-CM | POA: Diagnosis not present

## 2019-09-05 IMAGING — DX PORTABLE CHEST - 1 VIEW
1 series · 2 of 2 positions shown · non-contrast
Comparison: Chest radiograph 06/15/2017

CLINICAL DATA: Patient with shortness of breath.

EXAM:
PORTABLE CHEST 1 VIEW

[Series 1: chest ap · 0.14mm/px · 2 of 2 slices shown]
[im 1/2]
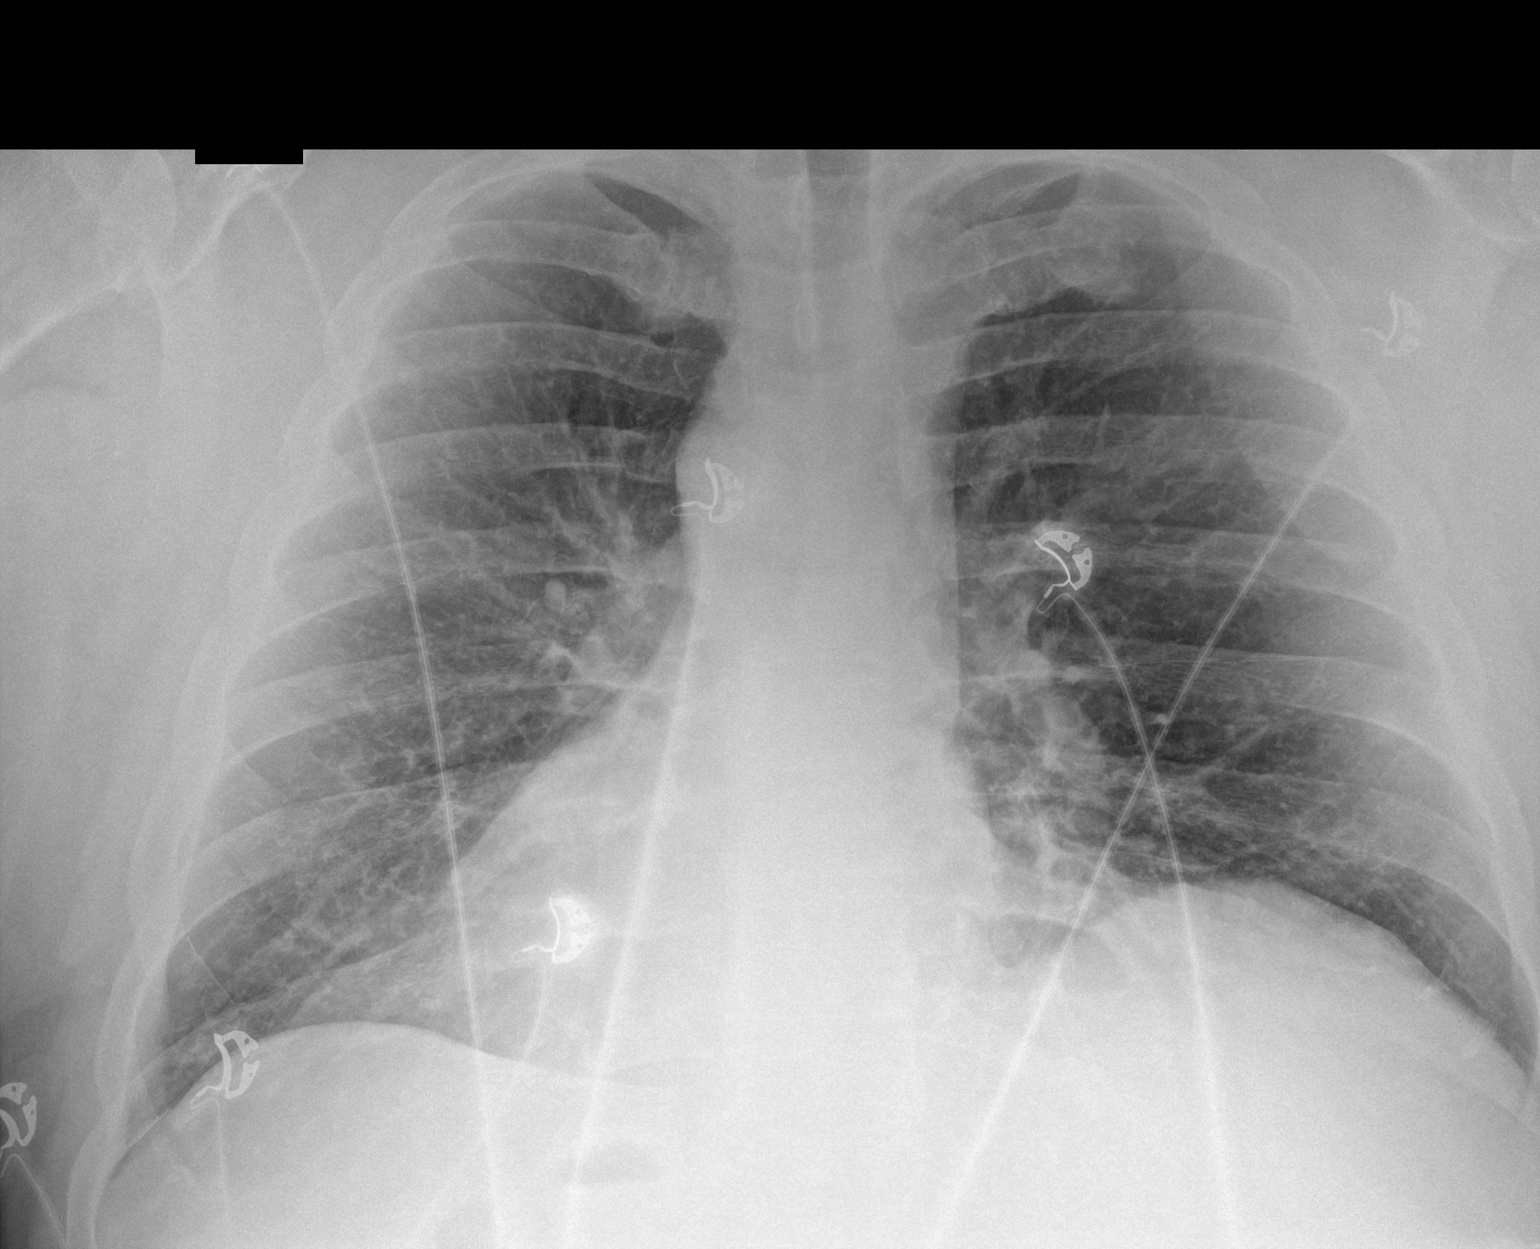
[im 2/2]
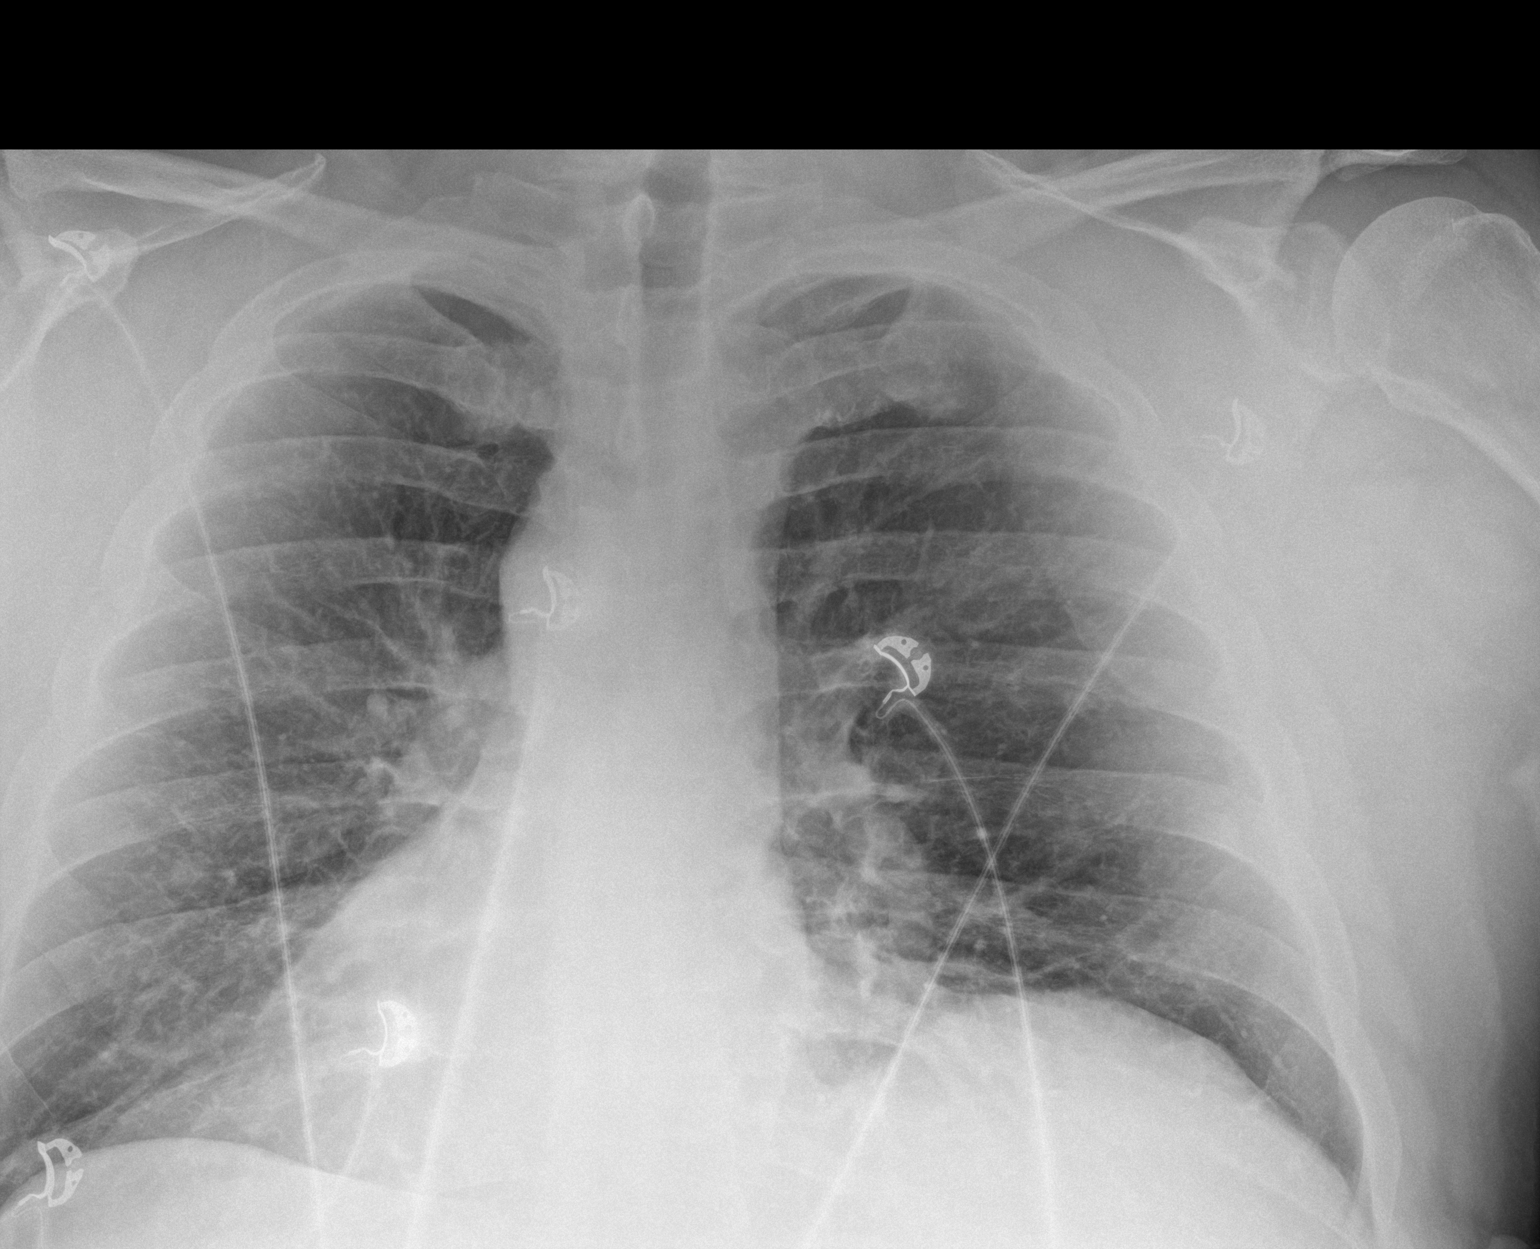

[2 of 2 positions shown; findings below may reference images not displayed]

FINDINGS: Monitoring leads overlie the patient. Normal cardiac and mediastinal
contours. Elevation right hemidiaphragm. Minimal bibasilar
heterogeneous opacities. No pleural effusion or pneumothorax.
IMPRESSION: Minimal bibasilar atelectasis.

## 2019-12-17 DIAGNOSIS — F410 Panic disorder [episodic paroxysmal anxiety] without agoraphobia: Secondary | ICD-10-CM | POA: Diagnosis not present

## 2020-01-21 ENCOUNTER — Encounter: Payer: Self-pay | Admitting: Family Medicine

## 2020-01-21 ENCOUNTER — Telehealth (INDEPENDENT_AMBULATORY_CARE_PROVIDER_SITE_OTHER): Payer: Medicaid Other | Admitting: Family Medicine

## 2020-01-21 VITALS — Ht 71.0 in | Wt 315.0 lb

## 2020-01-21 DIAGNOSIS — L989 Disorder of the skin and subcutaneous tissue, unspecified: Secondary | ICD-10-CM | POA: Diagnosis not present

## 2020-01-21 NOTE — Progress Notes (Signed)
Name: Ronald Lucas   MRN: [id number]    DOB: 1961-05-12   Date:01/21/2020       Progress Note  Subjective:    Chief Complaint  Chief Complaint  Patient presents with   Cyst    on back, onset 2-3 months with itching, bleeing painful and hard    I connected with  [name]  on 01/21/20 at 11:20 AM EDT by a video enabled telemedicine application and verified that I am speaking with the correct person using two identifiers.  I discussed the limitations of evaluation and management by telemedicine and the availability of in person appointments. The patient expressed understanding and agreed to proceed. Staff also discussed with the patient that there may be a patient responsible charge related to this service. Patient Location: home Provider Location: [facility] Additional Individuals present: his mother helped with call and showing lesion on back  HPI Didn't know it was there until until a few months ago, and never noticed it before.   He notes other "clusters" of similar bumps/lesions. He reports he was "exposed to HPV" and his mother recently diagnosed with [place] so he is anxious that it is cancer.  He reports round lesion/bump to left upper back, he does not know if it is changing in size but he does feel that it is firm, itches and is sometimes painful and will bleed.  He can feel it when he lays in bed.  He does not know if he is had any purulent drainage and he denies any surrounding swelling or redness of the skin.  When discussing options on how to evaluate it in person he states that he rather go to a dermatologist to have everything looked up at once and make sure it is not cancer.  He has never been to dermatology, he did not prefer to go to anyone in particular but he is very anxious to get a diagnosis and would like something the soonest and the closest to him in [place] area.  He prefers not to drive to [facility]    Patient Active Problem List   Diagnosis Date  Noted   ADD (attention deficit disorder) 03/17/2019   Personality disorder in adult [facility]) 03/17/2019   Asthma 05/09/2018   PTSD (post-traumatic stress disorder) 05/09/2018   Chronic midline low back pain with bilateral sciatica 07/19/2017   Skin tag 07/19/2017   Shortness of breath 07/19/2017   Wheezing 07/19/2017   Multiple allergies 07/19/2017   Bipolar disorder, current episode mixed, moderate ([place]) 07/19/2017   Panic disorder 07/19/2017   BMI 40.0-44.9, adult ([place]) 07/19/2017   Vascular calcification 07/19/2017   Complex regional pain syndrome i of right lower limb 01/18/2016   Dupuytren's contracture 01/18/2016   Hypertension 05/28/2011    Social History   Tobacco Use   Smoking status: Current Every Day Smoker    Packs/day: 0.10    Types: Cigarettes    Start date: 03/17/1983   Smokeless tobacco: Never Used  Substance Use Topics   Alcohol use: No     Current Outpatient Medications:    ALPRAZolam (XANAX) 0.25 MG tablet, Take 0.25 mg by mouth 2 (two) times daily as needed for anxiety., Disp: , Rfl:    DiphenhydrAMINE HCl (BENADRYL ALLERGY PO), Take 1 tablet by mouth 3 (three) times daily., Disp: , Rfl:    lisinopril (ZESTRIL) 10 MG tablet, Take 1 tablet (10 mg total) by mouth daily. Patient wants to stay on medication, Disp: 30 tablet, Rfl: 5  umeclidinium-vilanterol (ANORO ELLIPTA) 62.5-25 MCG/INH AEPB, Inhale 1 puff into the lungs daily., Disp: 30 each, Rfl: 5  Allergies  Allergen Reactions   Peanut Oil Anaphylaxis   Peanut-Containing Drug Products Anaphylaxis   Penicillins Hives    Other reaction(s): Anaphylaxis Throat swelling   Tetanus Toxoids     Other reaction(s): Swelling   Buspar [Buspirone] Other (See Comments)    Motion sickness   Flexeril [Cyclobenzaprine] Other (See Comments), Nausea Only and Nausea And Vomiting    hallucinations   Gabapentin Nausea And Vomiting   Lisinopril     Muscle cramp    Methocarbamol Nausea  Only   Morphine And Related Other (See Comments)    Gets violent if takes Morphine    Norvasc [Amlodipine Besylate]     Sob, wheezing    Prednisone     Other reaction(s): Hallucination hallucinations hallucinations    I personally reviewed active problem list, medication list, allergies, family history, social history, health maintenance, notes from last encounter, lab results, imaging with the patient/caregiver today.   Review of Systems  10 Systems reviewed and are negative for acute change except as noted in the HPI.   Objective:   Virtual encounter, vitals limited, only able to obtain the following Today's Vitals   01/21/20 1141  Weight: (!) 315 lb (142.9 kg)  Height: 5\' 11"  (1.803 m)   Body mass index is 43.93 kg/m. Nursing Note and Vital Signs reviewed.  Physical Exam Vitals and nursing note reviewed.  Constitutional:      General: He is not in acute distress.    Appearance: He is obese. He is not ill-appearing, toxic-appearing or diaphoretic.  Pulmonary:     Effort: No respiratory distress.  Skin:    Comments: Poor resolution on video/virtual call Left side mid to upper back Round, raised skin lesion - color does not look completely uniform but difficult to assess with poor resolution, diameter est to be just shy of 74mm - pt states "about the size of pencil eraser" no visible surrounding edema or erythema.  No visible bleeding or discharge.    Neurological:     Mental Status: He is alert.     PE limited by telephone encounter  No results found for this or any previous visit (from the past 72 hour(s)).  Assessment and Plan:     ICD-10-CM   1. Skin lesion of back  L98.9 Ambulatory referral to Dermatology    Patient presents via virtual encounter for 2 to 3 months of a apparently new skin lesion noted to his back he never know was there before only noticed recently when it started to be firm painful itching and bleeding.  He does not know if it is  changed in size but he is anxious to have it evaluated because of fear of exposure to HPV and because a his mother was recently dx with basal cell carcinoma.  The quality of visualization of the lesion was fairly poor today it was difficult to see the area, for a few seconds the resolution was clear and it did appear to be a round raised shiny brown possible nevi or lesion did not look completely uniform in color but difficult to discern that.  I did not see any excoriations, edema, erythema any dried blood did not appear to be acutely inflamed or infected but it was very difficult for me to see or assess that.  The patient was slightly agitated and there was a lot of arguing between him and  his mother during the encounter this made it difficult to get some of the history from him due to his frustrations.  I did explain that initially I would want to see him in office to do an exam and see if this is something that is inflamed and needs to be drained or treated or if it something that is new and we could do a shave biopsy or if unable to do need a procedure here we would refer him to dermatology.  Patient to stated that he would rather see the dermatologist so he would know what is going on.  They were explained that for new patient appointments with dermatology specialist there may be some delay and if it is acutely worse or she is extremely anxious I did invite him to please come into the office for in person assessment and examination.  - I discussed the assessment and treatment plan with the patient. The patient was provided an opportunity to ask questions and all were answered. The patient agreed with the plan and demonstrated an understanding of the instructions.   Delsa Grana, PA-C 01/21/20 11:55 AM

## 2020-02-18 DIAGNOSIS — F410 Panic disorder [episodic paroxysmal anxiety] without agoraphobia: Secondary | ICD-10-CM | POA: Diagnosis not present

## 2020-04-05 ENCOUNTER — Encounter: Payer: Self-pay | Admitting: Family Medicine

## 2020-04-05 ENCOUNTER — Other Ambulatory Visit: Payer: Self-pay

## 2020-04-05 ENCOUNTER — Ambulatory Visit (INDEPENDENT_AMBULATORY_CARE_PROVIDER_SITE_OTHER): Payer: Medicaid Other | Admitting: Family Medicine

## 2020-04-05 VITALS — BP 150/90 | HR 83 | Temp 96.9°F | Resp 16 | Ht 71.0 in | Wt 308.6 lb

## 2020-04-05 DIAGNOSIS — M5442 Lumbago with sciatica, left side: Secondary | ICD-10-CM

## 2020-04-05 DIAGNOSIS — R252 Cramp and spasm: Secondary | ICD-10-CM | POA: Diagnosis not present

## 2020-04-05 DIAGNOSIS — R1319 Other dysphagia: Secondary | ICD-10-CM

## 2020-04-05 DIAGNOSIS — M5441 Lumbago with sciatica, right side: Secondary | ICD-10-CM | POA: Diagnosis not present

## 2020-04-05 DIAGNOSIS — F410 Panic disorder [episodic paroxysmal anxiety] without agoraphobia: Secondary | ICD-10-CM

## 2020-04-05 DIAGNOSIS — I10 Essential (primary) hypertension: Secondary | ICD-10-CM | POA: Diagnosis not present

## 2020-04-05 DIAGNOSIS — E781 Pure hyperglyceridemia: Secondary | ICD-10-CM

## 2020-04-05 DIAGNOSIS — F3162 Bipolar disorder, current episode mixed, moderate: Secondary | ICD-10-CM

## 2020-04-05 DIAGNOSIS — G8929 Other chronic pain: Secondary | ICD-10-CM

## 2020-04-05 DIAGNOSIS — R1310 Dysphagia, unspecified: Secondary | ICD-10-CM

## 2020-04-05 DIAGNOSIS — J449 Chronic obstructive pulmonary disease, unspecified: Secondary | ICD-10-CM | POA: Diagnosis not present

## 2020-04-05 DIAGNOSIS — R7303 Prediabetes: Secondary | ICD-10-CM

## 2020-04-05 MED ORDER — LISINOPRIL 5 MG PO TABS: 5.0000 mg | ORAL_TABLET | Freq: Every day | ORAL | 0 refills | Status: DC

## 2020-04-05 MED ORDER — IPRATROPIUM-ALBUTEROL 20-100 MCG/ACT IN AERS: 1.0000 | INHALATION_SPRAY | Freq: Four times a day (QID) | RESPIRATORY_TRACT | 2 refills | Status: DC | PRN

## 2020-04-05 NOTE — Progress Notes (Signed)
Name: Ronald Lucas   MRN: [id number]    DOB: [birth year]   Date:04/05/2020       Progress Note  Subjective  Chief Complaint  Chief Complaint  Patient presents with  . Gastroesophageal Reflux  . Dry Mouth  . Fatigue    HPI  HTN: bp was very high when he went to [facility] 168/93 but patient states it improved because he went home, he denies current chest pain or palpitation, bp today still not at goal, he states unable to tolerate 10 mg of lisinopril because it causes muscle cramping, only taking 5 mg daily .   Dysphagia/GERD: he states he has noticed symptoms within the past year, it can happened with chicken and steak spaghetti , he states it causes hiccups increase in salivation and pain. He also has episodes of regurgitation that will make it come up, sometimes vomiting. He has also noticed increase in heartburn and indigestion over the past 4 months, associated with bloating and eructation, no significant weight loss. Taking Tums but causes constipation, advised to continue until seen by GI   Bipolar and panic disorder: seeing psychiatrist, Dr. Tamera Punt who monitors him, he is not on medications because of side effects. He states COVID made symptoms worse, he still sees psychiatrist , only on xanax for panic episodes   Pre-diabetes: A1C was 6.4%, his glucose has been in the 140's range, he refuses to take metformin - he states he is afraid of side effects, he refuses medications in general because he has so many drug allergies.  Discussed low carbohydrate diet His psychiatrist discussed Ozempic but explained because of dyspepsia we need to hold off until seen by gastroenterologist   COPD/Asthma: he states he stopped Anoro because it made him feel anxious, discussed going back to Spiriva, but he states he prefers prn medication, still smokes a few cigarettes daily, he is afraid of possible reaction to  flu and covid vaccine. He has daily cough and sob with activity   Chronic Back pain: went  to pain clinic but could not prescribe medication because he is on alprazolam. , he is in daily pain, stable.   Morbid obesity :BMI above 40, weight is stable, he is trying to eat healthier and smaller portions, but at times over eats.   Patient Active Problem List   Diagnosis Date Noted  . ADD (attention deficit disorder) 03/17/2019  . Personality disorder in adult [facility]) 03/17/2019  . Asthma 05/09/2018  . PTSD (post-traumatic stress disorder) 05/09/2018  . Chronic midline low back pain with bilateral sciatica 07/19/2017  . Skin tag 07/19/2017  . Shortness of breath 07/19/2017  . Wheezing 07/19/2017  . Multiple allergies 07/19/2017  . Bipolar disorder, current episode mixed, moderate ([place]) 07/19/2017  . Panic disorder 07/19/2017  . BMI 40.0-44.9, adult ([place]) 07/19/2017  . Vascular calcification 07/19/2017  . Complex regional pain syndrome i of right lower limb 01/18/2016  . Dupuytren's contracture 01/18/2016  . Hypertension 05/28/2011    Past Surgical History:  Procedure Laterality Date  . CARDIAC SURGERY    . FRACTURE SURGERY      Family History  Problem Relation Age of Onset  . Colon cancer Sister     Social History   Tobacco Use  . Smoking status: Current Every Day Smoker    Packs/day: 0.10    Types: Cigarettes    Start date: 03/17/1983  . Smokeless tobacco: Never Used  Substance Use Topics  . Alcohol use: No     Current Outpatient  Medications:  .  ALPRAZolam (XANAX) 0.25 MG tablet, Take 0.25 mg by mouth 2 (two) times daily as needed for anxiety., Disp: , Rfl:  .  cetirizine (ZYRTEC) 10 MG tablet, Take 10 mg by mouth daily., Disp: , Rfl:  .  DiphenhydrAMINE HCl (BENADRYL ALLERGY PO), Take 1 tablet by mouth 3 (three) times daily., Disp: , Rfl:  .  lisinopril (ZESTRIL) 10 MG tablet, Take 1 tablet (10 mg total) by mouth daily. Patient wants to stay on medication, Disp: 30 tablet, Rfl: 5 .  umeclidinium-vilanterol (ANORO ELLIPTA) 62.5-25 MCG/INH AEPB, Inhale 1 puff  into the lungs daily., Disp: 30 each, Rfl: 5  Allergies  Allergen Reactions  . Peanut Oil Anaphylaxis  . Peanut-Containing Drug Products Anaphylaxis  . Penicillins Hives    Other reaction(s): Anaphylaxis Throat swelling  . Tetanus Toxoids     Other reaction(s): Swelling  . Buspar [Buspirone] Other (See Comments)    Motion sickness  . Flexeril [Cyclobenzaprine] Other (See Comments), Nausea Only and Nausea And Vomiting    hallucinations  . Gabapentin Nausea And Vomiting  . Lisinopril     Muscle cramp   . Methocarbamol Nausea Only  . Morphine And Related Other (See Comments)    Gets violent if takes Morphine   . Norvasc [Amlodipine Besylate]     Sob, wheezing   . Prednisone     Other reaction(s): Hallucination hallucinations hallucinations    I personally reviewed active problem list, medication list, allergies, family history, social history, health maintenance with the patient/caregiver today.   ROS  Constitutional: Negative for fever or weight change.  Respiratory: Positive  for cough and shortness of breath.   Cardiovascular: Negative for chest pain or palpitations.  Gastrointestinal: positive for abdominal pain, but no  bowel changes.  Musculoskeletal: Negative for gait problem or joint swelling.  Skin: positive for rash. - eczema - out of medication  Neurological: Negative for dizziness , positive for  headache.  No other specific complaints in a complete review of systems (except as listed in HPI above).  Objective  Vitals:   04/05/20 1337  BP: (!) 144/90  Pulse: 83  Resp: 16  Temp: (!) 96.9 F (36.1 C)  TempSrc: Temporal  SpO2: 95%  Weight: (!) 308 lb 9.6 oz (140 kg)  Height: 5\' 11"  (1.803 m)    Body mass index is 43.04 kg/m.  Physical Exam  Constitutional: Patient appears well-developed and well-nourished. Obese No distress.  HEENT: head atraumatic, normocephalic, pupils equal and reactive to light,  neck supple Cardiovascular: Normal rate,  regular rhythm and normal heart sounds.  No murmur heard. No BLE edema. Pulmonary/Chest: Effort normal and breath sounds normal. No respiratory distress. Abdominal: Soft.  There is no tenderness. Psychiatric: Patient has a normal mood and affect. behavior is normal. Judgment and thought content normal.  PHQ2/9: Depression screen [facility] 2/9 04/05/2020 01/21/2020 06/11/2019 03/17/2019 03/09/2019  Decreased Interest 0 0 3 0 0  Down, Depressed, Hopeless 0 2 3 3 3   PHQ - 2 Score 0 2 6 3 3   Altered sleeping 0 0 3 3 3   Tired, decreased energy 0 0 3 3 3   Change in appetite 0 0 3 3 3   Feeling bad or failure about yourself  0 0 3 3 3   Trouble concentrating 0 0 3 3 3   Moving slowly or fidgety/restless 0 0 0 3 3  Suicidal thoughts 0 0 3 3 3   PHQ-9 Score 0 2 24 24 24   Difficult doing work/chores - Somewhat  difficult Extremely dIfficult Extremely dIfficult Extremely dIfficult    phq 9 is negative   Fall Risk: Fall Risk  04/05/2020 01/21/2020 06/11/2019 03/17/2019 03/09/2019  Falls in the past year? 1 0 0 1 1  Number falls in past yr: 1 0 0 1 1  Injury with Fall? 1 0 0 1 1  Follow up - - - - Falls evaluation completed      Assessment & Plan   1. Essential hypertension  - lisinopril (ZESTRIL) 5 MG tablet; Take 1 tablet (5 mg total) by mouth daily. Patient wants to stay on medication  Dispense: 90 tablet; Refill: 0 - Lipid panel - COMPLETE METABOLIC PANEL WITH GFR - CBC with Differential/Platelet  2. Esophageal dysphagia  - Ambulatory referral to Gastroenterology  3. 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  Dispense: 4 g; Refill: 2  5. Bipolar disorder, current episode mixed, moderate ([place])  Keep follow up with psychiatrist   6. Hypertriglyceridemia  - Lipid panel - COMPLETE METABOLIC PANEL WITH GFR  7. Chronic midline low back  pain with bilateral sciatica  Chronic   8. Muscle cramps  Doing better on half dose of lisinopril   9. Panic disorder  Sees pychiatrist   10. Morbid obesity ([place])  Discussed with the patient the risk posed by an increased BMI. Discussed importance of portion control, calorie counting and at least 150 minutes of physical activity weekly. Avoid sweet beverages and drink more water. Eat at least 6 servings of fruit and vegetables daily

## 2020-04-06 LAB — CBC WITH DIFFERENTIAL/PLATELET
Absolute Monocytes: 647 {cells}/uL (ref 200–950)
Basophils Absolute: 123 {cells}/uL (ref 0–200)
Basophils Relative: 1.6 %
Eosinophils Absolute: 308 {cells}/uL (ref 15–500)
Eosinophils Relative: 4 %
HCT: 45.8 % (ref 38.5–50.0)
Hemoglobin: 15.8 g/dL (ref 13.2–17.1)
Lymphs Abs: 2156 {cells}/uL (ref 850–3900)
MCH: 30.5 pg (ref 27.0–33.0)
MCHC: 34.5 g/dL (ref 32.0–36.0)
MCV: 88.4 fL (ref 80.0–100.0)
MPV: 9.7 fL (ref 7.5–12.5)
Monocytes Relative: 8.4 %
Neutro Abs: 4466 {cells}/uL (ref 1500–7800)
Neutrophils Relative %: 58 %
Platelets: 230 Thousand/uL (ref 140–400)
RBC: 5.18 Million/uL (ref 4.20–5.80)
RDW: 12.7 % (ref 11.0–15.0)
Total Lymphocyte: 28 %
WBC: 7.7 Thousand/uL (ref 3.8–10.8)

## 2020-04-06 LAB — LIPID PANEL
Cholesterol: 209 mg/dL — ABNORMAL HIGH
HDL: 33 mg/dL — ABNORMAL LOW
LDL Cholesterol (Calc): 129 mg/dL — ABNORMAL HIGH
Non-HDL Cholesterol (Calc): 176 mg/dL — ABNORMAL HIGH
Total CHOL/HDL Ratio: 6.3 (calc) — ABNORMAL HIGH
Triglycerides: 329 mg/dL — ABNORMAL HIGH

## 2020-04-06 LAB — COMPLETE METABOLIC PANEL WITH GFR
AG Ratio: 1.6 (calc) (ref 1.0–2.5)
ALT: 36 U/L (ref 9–46)
AST: 36 U/L — ABNORMAL HIGH (ref 10–35)
Albumin: 4.5 g/dL (ref 3.6–5.1)
Alkaline phosphatase (APISO): 43 U/L (ref 35–144)
BUN: 12 mg/dL (ref 7–25)
CO2: 23 mmol/L (ref 20–32)
Calcium: 9.7 mg/dL (ref 8.6–10.3)
Chloride: 101 mmol/L (ref 98–110)
Creat: 1.12 mg/dL (ref 0.70–1.33)
GFR, Est African American: 83 mL/min/{1.73_m2} (ref >=60)
GFR, Est Non African American: 72 mL/min/{1.73_m2} (ref >=60)
Globulin: 2.8 g/dL (calc) (ref 1.9–3.7)
Glucose, Bld: 167 mg/dL — ABNORMAL HIGH (ref 65–99)
Potassium: 4.2 mmol/L (ref 3.5–5.3)
Sodium: 135 mmol/L (ref 135–146)
Total Bilirubin: 0.4 mg/dL (ref 0.2–1.2)
Total Protein: 7.3 g/dL (ref 6.1–8.1)

## 2020-04-06 LAB — HEMOGLOBIN A1C
Hgb A1c MFr Bld: 7.2 %{Hb} — ABNORMAL HIGH
Mean Plasma Glucose: 160 (calc)
eAG (mmol/L): 8.9 (calc)

## 2020-04-07 ENCOUNTER — Other Ambulatory Visit: Payer: Self-pay | Admitting: Family Medicine

## 2020-04-07 DIAGNOSIS — Z1211 Encounter for screening for malignant neoplasm of colon: Secondary | ICD-10-CM

## 2020-04-18 ENCOUNTER — Telehealth: Payer: Medicaid Other

## 2020-04-20 ENCOUNTER — Ambulatory Visit (INDEPENDENT_AMBULATORY_CARE_PROVIDER_SITE_OTHER): Payer: Medicaid Other | Admitting: Dermatology

## 2020-04-20 ENCOUNTER — Other Ambulatory Visit: Payer: Self-pay

## 2020-04-20 DIAGNOSIS — L821 Other seborrheic keratosis: Secondary | ICD-10-CM | POA: Diagnosis not present

## 2020-04-20 DIAGNOSIS — L918 Other hypertrophic disorders of the skin: Secondary | ICD-10-CM | POA: Diagnosis not present

## 2020-04-20 DIAGNOSIS — L300 Nummular dermatitis: Secondary | ICD-10-CM

## 2020-04-20 DIAGNOSIS — D239 Other benign neoplasm of skin, unspecified: Secondary | ICD-10-CM

## 2020-04-20 DIAGNOSIS — D235 Other benign neoplasm of skin of trunk: Secondary | ICD-10-CM

## 2020-04-20 DIAGNOSIS — L820 Inflamed seborrheic keratosis: Secondary | ICD-10-CM

## 2020-04-20 MED ORDER — MOMETASONE FUROATE 0.1 % EX OINT: TOPICAL_OINTMENT | Freq: Every day | CUTANEOUS | 1 refills | Status: DC

## 2020-04-20 NOTE — Patient Instructions (Signed)

## 2020-04-20 NOTE — Progress Notes (Addendum)
   New Patient Visit  Subjective  Ronald Lucas is a 59 y.o. male who presents in consultation from Dr Ancil Boozer for the following: Rash (Pt c/o rash on his legs, pt was diagnosed with Eczema years ago) and Skin Problem (check spot on my back raised and growing ).  The following portions of the chart were reviewed this encounter and updated as appropriate:  Tobacco  Allergies  Meds  Problems  Med Hx  Surg Hx  Fam Hx     Review of Systems:  No other skin or systemic complaints except as noted in HPI or Assessment and Plan.  Objective  Well appearing patient in no apparent distress; mood and affect are within normal limits.  A focused examination was performed including face, neck, back, legs . Relevant physical exam findings are noted in the Assessment and Plan.  Objective  Right scapula: Firm pink/brown papulenodule with dimple sign.   Objective  Left Upper Back: Stuck-on, waxy, tan-brown papules and plaques -- Discussed benign etiology and prognosis.   Objective  Left scalp: Erythematous keratotic or waxy stuck-on papule or plaque.   Objective  Neck - Anterior: Fleshy, skin-colored pedunculated papules.    Objective  Right Lower Leg - Posterior: Nummular patches    Assessment & Plan    Dermatofibroma Right scapula  Observe   Seborrheic keratosis Left Upper Back  Observe   Inflamed seborrheic keratosis Left scalp  Destruction of lesion - Left scalp Complexity: simple   Destruction method: cryotherapy   Informed consent: discussed and consent obtained   Timeout:  patient name, date of birth, surgical site, and procedure verified Lesion destroyed using liquid nitrogen: Yes   Region frozen until ice ball extended beyond lesion: Yes   Outcome: patient tolerated procedure well with no complications   Post-procedure details: wound care instructions given    Skin tag Neck - Anterior  Observe   Nummular eczema Right Lower Leg - Posterior  Start  Mometasone ointment apply to skin bid x 2 weeks, then decrease to 5 nights a week only   Ordered Medications: mometasone (ELOCON) 0.1 % ointment  Return in about 2 months (around 06/21/2020).  IMarye Round, CMA, am acting as scribe for Sarina Ser, MD .  Documentation: I have reviewed the above documentation for accuracy and completeness, and I agree with the above.  Sarina Ser, MD

## 2020-04-23 ENCOUNTER — Encounter: Payer: Self-pay | Admitting: Dermatology

## 2020-04-25 NOTE — Addendum Note (Signed)
Addended by: Ralene Bathe on: 04/25/2020 05:06 PM   Modules accepted: Level of Service

## 2020-05-05 DIAGNOSIS — F410 Panic disorder [episodic paroxysmal anxiety] without agoraphobia: Secondary | ICD-10-CM | POA: Diagnosis not present

## 2020-06-29 ENCOUNTER — Ambulatory Visit (INDEPENDENT_AMBULATORY_CARE_PROVIDER_SITE_OTHER): Payer: Medicaid Other | Admitting: Family Medicine

## 2020-06-29 ENCOUNTER — Encounter: Payer: Self-pay | Admitting: Family Medicine

## 2020-06-29 ENCOUNTER — Other Ambulatory Visit: Payer: Self-pay

## 2020-06-29 VITALS — BP 146/88 | HR 82 | Temp 98.0°F | Resp 22 | Ht 71.0 in | Wt 312.2 lb

## 2020-06-29 DIAGNOSIS — E1159 Type 2 diabetes mellitus with other circulatory complications: Secondary | ICD-10-CM

## 2020-06-29 DIAGNOSIS — R1310 Dysphagia, unspecified: Secondary | ICD-10-CM | POA: Diagnosis not present

## 2020-06-29 DIAGNOSIS — R7303 Prediabetes: Secondary | ICD-10-CM | POA: Diagnosis not present

## 2020-06-29 DIAGNOSIS — R1319 Other dysphagia: Secondary | ICD-10-CM

## 2020-06-29 DIAGNOSIS — G8929 Other chronic pain: Secondary | ICD-10-CM

## 2020-06-29 DIAGNOSIS — F3162 Bipolar disorder, current episode mixed, moderate: Secondary | ICD-10-CM | POA: Diagnosis not present

## 2020-06-29 DIAGNOSIS — E1169 Type 2 diabetes mellitus with other specified complication: Secondary | ICD-10-CM

## 2020-06-29 DIAGNOSIS — I10 Essential (primary) hypertension: Secondary | ICD-10-CM | POA: Diagnosis not present

## 2020-06-29 DIAGNOSIS — E781 Pure hyperglyceridemia: Secondary | ICD-10-CM

## 2020-06-29 DIAGNOSIS — M5442 Lumbago with sciatica, left side: Secondary | ICD-10-CM

## 2020-06-29 DIAGNOSIS — J449 Chronic obstructive pulmonary disease, unspecified: Secondary | ICD-10-CM

## 2020-06-29 DIAGNOSIS — Z889 Allergy status to unspecified drugs, medicaments and biological substances status: Secondary | ICD-10-CM

## 2020-06-29 DIAGNOSIS — E669 Obesity, unspecified: Secondary | ICD-10-CM

## 2020-06-29 DIAGNOSIS — E785 Hyperlipidemia, unspecified: Secondary | ICD-10-CM

## 2020-06-29 DIAGNOSIS — M5441 Lumbago with sciatica, right side: Secondary | ICD-10-CM

## 2020-06-29 DIAGNOSIS — I152 Hypertension secondary to endocrine disorders: Secondary | ICD-10-CM

## 2020-06-29 MED ORDER — ROSUVASTATIN CALCIUM 5 MG PO TABS: 5.0000 mg | ORAL_TABLET | Freq: Every day | ORAL | 2 refills | Status: DC

## 2020-06-29 MED ORDER — DAPAGLIFLOZIN PROPANEDIOL 10 MG PO TABS: 10.0000 mg | ORAL_TABLET | Freq: Every day | ORAL | 2 refills | Status: DC

## 2020-06-29 MED ORDER — LISINOPRIL 5 MG PO TABS: 5.0000 mg | ORAL_TABLET | Freq: Every day | ORAL | 0 refills | Status: DC

## 2020-06-29 NOTE — Progress Notes (Signed)
Name: Ronald Lucas   MRN: [id number]    DOB: 08-Jun-1961   Date:06/29/2020       Progress Note  Subjective  Chief Complaint  Chief Complaint  Patient presents with   Back Pain   Allergies    HPI  HTN: bp was very high when he went to [facility] 168/93 today bp still not at goal, he is in a lot of pain, he does not want to add any medications, states unable to tolerate higher dose of lisinopril, he denies chest pain or palpitation   Dysphagia/GERD: he states he has noticed symptoms within the past year, it can happened with chicken and steak spaghetti , he states it causes hiccups increase in salivation and pain. He also has episodes of regurgitation that will make it come up, sometimes vomiting. He has also noticed increase in heartburn and indigestion over the past 4 months, associated with bloating and eructation, no significant weight loss. Taking Tums but causes constipation, advised to continue until seen by GI   Bipolar and panic disorder: seeing psychiatrist, Dr. Tamera Punt who monitors him, he is not on medications because of side effects only taking alprazolam bid . He states he is not doing well, the pain is causing him to " feel miserable"   New onset DM: his A1C went up to 7.2 % in July, he also has obesity, dyslipidemia and HTN  A1C , today he is wiling to try [place].  He is afraid of Ozempic because of dysphagia. He denies polyphagia, polydipsia or polyuria . Explained importance of yearly eye exam, urine micro, change in diet  COPD/Asthma: he states he stopped Anoro because it made him feel anxious, discussed going back to Spiriva, but he states he prefers prn medication, still smokes a few cigarettes daily, he is using Combivent daily now , he is worried about getting COVID -19 vaccine , he refused flu shot and pneumonia vaccine   Chronic Back pain: went to pain clinic but could not prescribe medication because he is on alprazolam. , he is in daily pain, but he states pain is  getting worse and radiating down both legs he is willing to find another pain doctor. Pain today is 10/10, no bowel or bladder incontinence. Explained I can give him muscle relaxer but he states it does not help and causes upset stomach, he cannot tolerate Gabapentin or Lyrica.   Multiple environmental and food/drug allergies: he states he would like to see an allergist   Patient Active Problem List   Diagnosis Date Noted   ADD (attention deficit disorder) 03/17/2019   Personality disorder in adult [facility]) 03/17/2019   Asthma 05/09/2018   PTSD (post-traumatic stress disorder) 05/09/2018   Chronic midline low back pain with bilateral sciatica 07/19/2017   Skin tag 07/19/2017   Shortness of breath 07/19/2017   Wheezing 07/19/2017   Multiple allergies 07/19/2017   Bipolar disorder, current episode mixed, moderate ([place]) 07/19/2017   Panic disorder 07/19/2017   BMI 40.0-44.9, adult ([place]) 07/19/2017   Vascular calcification 07/19/2017   Complex regional pain syndrome i of right lower limb 01/18/2016   Dupuytren's contracture 01/18/2016   Hypertension 05/28/2011    Past Surgical History:  Procedure Laterality Date   CARDIAC SURGERY     FRACTURE SURGERY      Family History  Problem Relation Age of Onset   Colon cancer Sister     Social History   Tobacco Use   Smoking status: Current Every Day Smoker  Packs/day: 0.10    Types: Cigarettes    Start date: 03/17/1983   Smokeless tobacco: Never Used  Substance Use Topics   Alcohol use: No     Current Outpatient Medications:    ALPRAZolam (XANAX) 0.25 MG tablet, Take 0.25 mg by mouth 2 (two) times daily as needed for anxiety., Disp: , Rfl:    Ipratropium-Albuterol (COMBIVENT) 20-100 MCG/ACT AERS respimat, Inhale 1 puff into the lungs 4 (four) times daily as needed for wheezing., Disp: 4 g, Rfl: 2   lisinopril (ZESTRIL) 5 MG tablet, Take 1 tablet (5 mg total) by mouth daily. Patient wants to stay on  medication, Disp: 90 tablet, Rfl: 0   mometasone (ELOCON) 0.1 % ointment, Apply topically daily. Apply to legs twice a day for 2 weeks, then decrease to 5 nights a week, Disp: 45 g, Rfl: 1   ALPRAZolam (XANAX) 0.5 MG tablet, Take 0.5 mg by mouth 2 (two) times daily. (Patient not taking: Reported on 06/29/2020), Disp: , Rfl:    dapagliflozin propanediol (FARXIGA) 10 MG TABS tablet, Take 1 tablet (10 mg total) by mouth daily before breakfast., Disp: 30 tablet, Rfl: 2   rosuvastatin (CRESTOR) 5 MG tablet, Take 1 tablet (5 mg total) by mouth daily., Disp: 30 tablet, Rfl: 2  Allergies  Allergen Reactions   Peanut Oil Anaphylaxis   Peanut-Containing Drug Products Anaphylaxis   Penicillins Hives    Other reaction(s): Anaphylaxis Throat swelling   Tetanus Toxoids     Other reaction(s): Swelling   Buspar [Buspirone] Other (See Comments)    Motion sickness   Flexeril [Cyclobenzaprine] Other (See Comments), Nausea Only and Nausea And Vomiting    hallucinations   Gabapentin Nausea And Vomiting   Methocarbamol Nausea Only   Morphine And Related Other (See Comments)    Gets violent if takes Morphine    Norvasc [Amlodipine Besylate]     Sob, wheezing    Prednisone     Other reaction(s): Hallucination hallucinations hallucinations   Lisinopril     Muscle cramp - able to tolerate lower dose 5 mg, does not want to change medication    I personally reviewed active problem list, medication list, allergies, family history, social history, health maintenance with the patient/caregiver today.   ROS  Ten systems reviewed and is negative except as mentioned in HPI   Objective  Vitals:   06/29/20 1031  BP: (!) 146/88  Pulse: 82  Resp: (!) 22  Temp: 98 F (36.7 C)  TempSrc: Oral  SpO2: 98%  Weight: (!) 312 lb 3.2 oz (141.6 kg)  Height: 5\' 11"  (1.803 m)    Body mass index is 43.54 kg/m.  Physical Exam  Constitutional: Patient appears well-developed and well-nourished.  Obese   HEENT: head atraumatic, normocephalic, pupils equal and reactive to light,  neck supple Cardiovascular: Normal rate, regular rhythm and normal heart sounds.  No murmur heard. No BLE edema. Pulmonary/Chest: Effort normal and breath sounds normal. No respiratory distress. Abdominal: Soft.  There is no tenderness. Psychiatric: Patient has a normal mood and affect. behavior is normal. Judgment and thought content normal. Muscular Skeletal: pain and spasm on mid back, pain during palpation of lumbar spine , moving around the room   Recent Results (from the past 2160 hour(s))  Lipid panel     Status: Abnormal   Collection Time: 04/05/20  2:11 PM  Result Value Ref Range   Cholesterol 209 (H) <200 mg/dL   HDL 33 (L) > OR = 40 mg/dL   Triglycerides  329 (H) <150 mg/dL    Comment: . If a non-fasting specimen was collected, consider repeat triglyceride testing on a fasting specimen if clinically indicated.  Yates Decamp et al. J. of Clin. Lipidol. 4540;9:811-914. Mar[place] Kitchen    LDL Cholesterol (Calc) 129 (H) mg/dL (calc)    Comment: Reference range: <100 . Desirable range <100 mg/dL for primary prevention;   <70 mg/dL for patients with CHD or diabetic patients  with > or = 2 CHD risk factors. [place] Kitchen LDL-C is now calculated using the Martin-Hopkins  calculation, which is a validated novel method providing  better accuracy than the Friedewald equation in the  estimation of LDL-C.  Cresenciano Genre et al. Annamaria Helling. 7829;562(13): 2061-2068  (http://education.[employer].com/faq/FAQ164)    Total CHOL/HDL Ratio 6.3 (H) <5.0 (calc)   Non-HDL Cholesterol (Calc) 176 (H) <130 mg/dL (calc)    Comment: For patients with diabetes plus 1 major ASCVD risk  factor, treating to a non-HDL-C goal of <100 mg/dL  (LDL-C of <70 mg/dL) is considered a therapeutic  option.   COMPLETE METABOLIC PANEL WITH GFR     Status: Abnormal   Collection Time: 04/05/20  2:11 PM  Result Value Ref Range   Glucose, Bld 167 (H) 65 - 99  mg/dL    Comment: .            Fasting reference interval . For someone without known diabetes, a glucose value >125 mg/dL indicates that they may have diabetes and this should be confirmed with a follow-up test. .    BUN 12 7 - 25 mg/dL   Creat 1.12 0.70 - 1.33 mg/dL    Comment: For patients >28 years of age, the reference limit for Creatinine is approximately 13% higher for people identified as African-American. .    GFR, Est Non African American 72 > OR = 60 mL/min/1.81m2   GFR, Est African American 83 > OR = 60 mL/min/1.16m2   BUN/Creatinine Ratio NOT APPLICABLE 6 - 22 (calc)   Sodium 135 135 - 146 mmol/L   Potassium 4.2 3.5 - 5.3 mmol/L   Chloride 101 98 - 110 mmol/L   CO2 23 20 - 32 mmol/L   Calcium 9.7 8.6 - 10.3 mg/dL   Total Protein 7.3 6.1 - 8.1 g/dL   Albumin 4.5 3.6 - 5.1 g/dL   Globulin 2.8 1.9 - 3.7 g/dL (calc)   AG Ratio 1.6 1.0 - 2.5 (calc)   Total Bilirubin 0.4 0.2 - 1.2 mg/dL   Alkaline phosphatase (APISO) 43 35 - 144 U/L   AST 36 (H) 10 - 35 U/L   ALT 36 9 - 46 U/L  CBC with Differential/Platelet     Status: None   Collection Time: 04/05/20  2:11 PM  Result Value Ref Range   WBC 7.7 3.8 - 10.8 Thousand/uL   RBC 5.18 4.20 - 5.80 Million/uL   Hemoglobin 15.8 13.2 - 17.1 g/dL   HCT 45.8 38 - 50 %   MCV 88.4 80.0 - 100.0 fL   MCH 30.5 27.0 - 33.0 pg   MCHC 34.5 32.0 - 36.0 g/dL   RDW 12.7 11.0 - 15.0 %   Platelets 230 140 - 400 Thousand/uL   MPV 9.7 7.5 - 12.5 fL   Neutro Abs 4,466 1,500 - 7,800 cells/uL   Lymphs Abs 2,156 850 - 3,900 cells/uL   Absolute Monocytes 647 200 - 950 cells/uL   Eosinophils Absolute 308 15 - 500 cells/uL   Basophils Absolute 123 0 - 200 cells/uL   Neutrophils Relative %  58 %   Total Lymphocyte 28.0 %   Monocytes Relative 8.4 %   Eosinophils Relative 4.0 %   Basophils Relative 1.6 %  Hemoglobin A1c     Status: Abnormal   Collection Time: 04/05/20  2:11 PM  Result Value Ref Range   Hgb A1c MFr Bld 7.2 (H) <5.7 % of  total Hgb    Comment: For someone without known diabetes, a hemoglobin A1c value of 6.5% or greater indicates that they may have  diabetes and this should be confirmed with a follow-up  test. . For someone with known diabetes, a value <7% indicates  that their diabetes is well controlled and a value  greater than or equal to 7% indicates suboptimal  control. A1c targets should be individualized based on  duration of diabetes, age, comorbid conditions, and  other considerations. . Currently, no consensus exists regarding use of hemoglobin A1c for diagnosis of diabetes for children. .    Mean Plasma Glucose 160 (calc)   eAG (mmol/L) 8.9 (calc)      PHQ2/9: Depression screen [facility] 2/9 06/29/2020 04/05/2020 01/21/2020 06/11/2019 03/17/2019  Decreased Interest 3 0 0 3 0  Down, Depressed, Hopeless 3 0 2 3 3   PHQ - 2 Score 6 0 2 6 3   Altered sleeping 3 0 0 3 3  Tired, decreased energy 3 0 0 3 3  Change in appetite 3 0 0 3 3  Feeling bad or failure about yourself  3 0 0 3 3  Trouble concentrating 3 0 0 3 3  Moving slowly or fidgety/restless 3 0 0 0 3  Suicidal thoughts 3 0 0 3 3  PHQ-9 Score 27 0 2 24 24   Difficult doing work/chores Extremely dIfficult - Somewhat difficult Extremely dIfficult Extremely dIfficult    phq 9 is positive   Fall Risk: Fall Risk  06/29/2020 04/05/2020 01/21/2020 06/11/2019 03/17/2019  Falls in the past year? 1 1 0 0 1  Number falls in past yr: 1 1 0 0 1  Injury with Fall? 1 1 0 0 1  Follow up - - - - -      Functional Status Survey: Is the patient deaf or have difficulty hearing?: No Does the patient have difficulty seeing, even when wearing glasses/contacts?: No Does the patient have difficulty concentrating, remembering, or making decisions?: Yes Does the patient have difficulty walking or climbing stairs?: Yes Does the patient have difficulty dressing or bathing?: Yes Does the patient have difficulty doing errands alone such as visiting a doctor's  office or shopping?: No    Assessment & Plan  1. Dyslipidemia associated with type 2 diabetes mellitus (HCC)  - Microalbumin / creatinine urine ratio  2. Esophageal dysphagia  Seeing Dr. Vicente Males  3. Prediabetes   4. COPD with asthma ([place])  Only on prn medication   5. Bipolar disorder, current episode mixed, moderate ([place])   6. Hypertriglyceridemia   7. Essential hypertension  - lisinopril (ZESTRIL) 5 MG tablet; Take 1 tablet (5 mg total) by mouth daily. Patient wants to stay on medication  Dispense: 90 tablet; Refill: 0  8. Morbid obesity ([place])  Discussed with the patient the risk posed by an increased BMI. Discussed importance of portion control, calorie counting and at least 150 minutes of physical activity weekly. Avoid sweet beverages and drink more water. Eat at least 6 servings of fruit and vegetables daily   9. Chronic bilateral low back pain with bilateral sciatica  - Ambulatory referral to Pain Clinic  10. Diabetes mellitus type 2 in obese ([place])   11. Hypertension associated with diabetes ([place])  Continue ACE  12. Multiple allergies  - Ambulatory referral to Immunology

## 2020-06-30 ENCOUNTER — Other Ambulatory Visit: Payer: Self-pay

## 2020-06-30 ENCOUNTER — Telehealth: Payer: Self-pay

## 2020-06-30 ENCOUNTER — Ambulatory Visit (INDEPENDENT_AMBULATORY_CARE_PROVIDER_SITE_OTHER): Payer: Medicaid Other | Admitting: Gastroenterology

## 2020-06-30 ENCOUNTER — Other Ambulatory Visit: Payer: Self-pay | Admitting: Family Medicine

## 2020-06-30 VITALS — BP 136/88 | HR 82 | Temp 98.1°F | Ht 71.0 in | Wt 310.0 lb

## 2020-06-30 DIAGNOSIS — Z800 Family history of malignant neoplasm of digestive organs: Secondary | ICD-10-CM | POA: Diagnosis not present

## 2020-06-30 DIAGNOSIS — R1310 Dysphagia, unspecified: Secondary | ICD-10-CM | POA: Diagnosis not present

## 2020-06-30 LAB — MICROALBUMIN / CREATININE URINE RATIO
Creatinine, Urine: 132 mg/dL (ref 20–320)
Microalb Creat Ratio: 4 mcg/mg creat (ref <30)
Microalb, Ur: 0.5 mg/dL

## 2020-06-30 MED ORDER — LEVOCETIRIZINE DIHYDROCHLORIDE 5 MG PO TABS
5.0000 mg | ORAL_TABLET | Freq: Every evening | ORAL | 2 refills | Status: DC
Start: 2020-06-30 — End: 2020-10-12

## 2020-06-30 MED ORDER — LEVOCETIRIZINE DIHYDROCHLORIDE 5 MG PO TABS: 5.0000 mg | ORAL_TABLET | Freq: Every evening | ORAL | 2 refills | Status: DC

## 2020-06-30 MED ORDER — NA SULFATE-K SULFATE-MG SULF 17.5-3.13-1.6 GM/177ML PO SOLN: 1.0000 | Freq: Once | ORAL | 0 refills | Status: AC

## 2020-06-30 MED ORDER — OMEPRAZOLE 40 MG PO CPDR: 40.0000 mg | DELAYED_RELEASE_CAPSULE | Freq: Every day | ORAL | 1 refills | Status: DC

## 2020-06-30 NOTE — Progress Notes (Signed)
Jonathon Bellows MD, MRCP(U.K) [address]  [place]  [place], [place] [address]  Main: [phone]  Fax: [phone]   Gastroenterology Consultation  Referring Provider:     Steele Sizer, MD Primary Care Physician:  Steele Sizer, MD Primary Gastroenterologist:  Dr. Jonathon Bellows  Reason for Consultation:     Dysphagia and colon cancer screening        HPI:   Ronald Lucas is a 59 y.o. y/o male referred for consultation & management  by Dr. Ancil Boozer, Drue Stager, MD.    He states he is here today to see me for difficulty swallowing ongoing for more than a few months.  He has difficulty swallowing solids more than liquids all kinds of food including tablets.  Was for meats.  He is a smoker.  Drinks alcohol occasionally.  He suffers from heartburn but only takes Tums.  Never had an endoscopy.  Never had a colonoscopy.  No lower GI symptoms.  Sister died of colon cancer who was younger than him.  He is having a lot of pain in his back from spinal stenosis.  He denies any use of NSAIDs.  Never had a colonoscopy previously. 04/05/2020: CMP normal except AST of 36, hemoglobin 15.8 g, HbA1c 7.2  Past Medical History:  Diagnosis Date  . ADHD   . Allergy   . Asthma   . Bipolar 1 disorder ([place])   . Depression   . Hypertension   . Panic attacks   . Panic disorder   . PTSD (post-traumatic stress disorder)     Past Surgical History:  Procedure Laterality Date  . CARDIAC SURGERY    . FRACTURE SURGERY      Prior to Admission medications   Medication Sig Start Date End Date Taking? Authorizing Provider  ALPRAZolam (XANAX) 0.25 MG tablet Take 0.25 mg by mouth 2 (two) times daily as needed for anxiety.    Alvy Bimler, MD  dapagliflozin propanediol (FARXIGA) 10 MG TABS tablet Take 1 tablet (10 mg total) by mouth daily before breakfast. 06/29/20   Ancil Boozer, Drue Stager, MD  Ipratropium-Albuterol (COMBIVENT) 20-100 MCG/ACT AERS respimat Inhale 1 puff into the lungs 4 (four) times daily as  needed for wheezing. 04/05/20   Steele Sizer, MD  lisinopril (ZESTRIL) 5 MG tablet Take 1 tablet (5 mg total) by mouth daily. Patient wants to stay on medication 06/29/20   Steele Sizer, MD  mometasone (ELOCON) 0.1 % ointment Apply topically daily. Apply to legs twice a day for 2 weeks, then decrease to 5 nights a week 04/20/20   Ralene Bathe, MD  rosuvastatin (CRESTOR) 5 MG tablet Take 1 tablet (5 mg total) by mouth daily. 06/29/20   Steele Sizer, MD    Family History  Problem Relation Age of Onset  . Colon cancer Sister      Social History   Tobacco Use  . Smoking status: Current Every Day Smoker    Packs/day: 0.10    Types: Cigarettes    Start date: 03/17/1983  . Smokeless tobacco: Never Used  Vaping Use  . Vaping Use: Never used  Substance Use Topics  . Alcohol use: No  . Drug use: No    Allergies as of 06/30/2020 - Review Complete 06/29/2020  Allergen Reaction Noted  . Peanut oil Anaphylaxis 07/19/2017  . Peanut-containing drug products Anaphylaxis 07/19/2017  . Penicillins Hives 05/28/2011  . Tetanus toxoids  05/28/2011  . Buspar [buspirone] Other (See Comments) 06/14/2015  . Flexeril [cyclobenzaprine] Other (See Comments),  Nausea Only, and Nausea And Vomiting 07/04/2015  . Gabapentin Nausea And Vomiting 07/19/2017  . Methocarbamol Nausea Only 07/19/2017  . Morphine and related Other (See Comments) 06/14/2015  . Norvasc [amlodipine besylate]  03/25/2019  . Prednisone  06/14/2015  . Lisinopril  03/25/2019    Review of Systems:    All systems reviewed and negative except where noted in HPI.   Physical Exam:  There were no vitals taken for this visit. No LMP for male patient. Psych:  Alert and cooperative. Normal mood and affect. General:   Alert,  Well-developed, well-nourished, pleasant and cooperative in NAD Head:  Normocephalic and atraumatic. Eyes:  Sclera clear, no icterus.   Conjunctiva pink. Ears:  Normal auditory acuity. Lungs:  Respirations  even and unlabored.  Clear throughout to auscultation.   No wheezes, crackles, or rhonchi. No acute distress. Heart:  Regular rate and rhythm; no murmurs, clicks, rubs, or gallops. Abdomen:  Normal bowel sounds.  No bruits.  Soft, non-tender and non-distended without masses, hepatosplenomegaly or hernias noted.  No guarding or rebound tenderness.    Neurologic:  Alert and oriented x3;   Psych:  Alert and cooperative. Normal mood and affect.  Imaging Studies: No results found.  Assessment and Plan:   Ronald Lucas is a 59 y.o. y/o male has been referred for dysphagia and colon cancer screening   Plan 1.  EGD for dysphagia and colon cancer screening colonoscopy with family history of colon cancer in his sister who is younger than him 2.  Commence on Prilosec 40 mg once a day to empirically treat any reflux as he is high risk for the same with a BMI of 43 and history of diabetes.   I have discussed alternative options, risks & benefits,  which include, but are not limited to, bleeding, infection, perforation,respiratory complication & drug reaction.  The patient agrees with this plan & written consent will be obtained.     Follow up in 8 to 12 weeks in my office  Dr Jonathon Bellows MD,MRCP(U.K)

## 2020-06-30 NOTE — Telephone Encounter (Signed)
Copied from [place] [id number]. Topic: General - Inquiry >> Jun 29, 2020  4:20 PM Gillis Ends D wrote: Reason for CRM: Patient called and wanted to know why he didn't get anything for allergies, which is the main reason he came in. He was very upset and thinks that she prescribed him something he didn't need. He said he is so sick of that diabetes crap and that the drug reps must be paying her a lot of money. He can be reached at [phone]. Please advise

## 2020-07-01 NOTE — Telephone Encounter (Signed)
Called patient. Patient aware. States that he does not feel good at all today.

## 2020-07-07 ENCOUNTER — Ambulatory Visit: Payer: Medicaid Other | Admitting: Dermatology

## 2020-07-19 ENCOUNTER — Other Ambulatory Visit
Admission: RE | Admit: 2020-07-19 | Discharge: 2020-07-19 | Disposition: A | Discharge status: Home / Self Care | Payer: Medicaid Other | Source: Ambulatory Visit | Attending: Gastroenterology | Admitting: Gastroenterology

## 2020-07-19 ENCOUNTER — Other Ambulatory Visit: Payer: Self-pay

## 2020-07-19 DIAGNOSIS — Z01812 Encounter for preprocedural laboratory examination: Secondary | ICD-10-CM | POA: Insufficient documentation

## 2020-07-19 DIAGNOSIS — Z20822 Contact with and (suspected) exposure to covid-19: Secondary | ICD-10-CM | POA: Insufficient documentation

## 2020-07-19 LAB — SARS CORONAVIRUS 2 (TAT 6-24 HRS): SARS Coronavirus 2: NEGATIVE

## 2020-07-20 ENCOUNTER — Encounter: Payer: Self-pay | Admitting: Gastroenterology

## 2020-07-21 ENCOUNTER — Ambulatory Visit: Payer: Medicaid Other | Admitting: Anesthesiology

## 2020-07-21 ENCOUNTER — Ambulatory Visit
Admission: RE | Admit: 2020-07-21 | Discharge: 2020-07-21 | Disposition: A | Discharge status: Home / Self Care | Payer: Medicaid Other | Attending: Gastroenterology | Admitting: Gastroenterology

## 2020-07-21 ENCOUNTER — Encounter: Payer: Self-pay | Admitting: Gastroenterology

## 2020-07-21 ENCOUNTER — Encounter
Admission: RE | Disposition: A | Discharge status: Home / Self Care | Payer: Self-pay | Source: Home / Self Care | Attending: Gastroenterology

## 2020-07-21 DIAGNOSIS — Z91010 Allergy to peanuts: Secondary | ICD-10-CM | POA: Insufficient documentation

## 2020-07-21 DIAGNOSIS — J45909 Unspecified asthma, uncomplicated: Secondary | ICD-10-CM | POA: Diagnosis not present

## 2020-07-21 DIAGNOSIS — J449 Chronic obstructive pulmonary disease, unspecified: Secondary | ICD-10-CM | POA: Diagnosis not present

## 2020-07-21 DIAGNOSIS — Z800 Family history of malignant neoplasm of digestive organs: Secondary | ICD-10-CM | POA: Diagnosis not present

## 2020-07-21 DIAGNOSIS — Z887 Allergy status to serum and vaccine status: Secondary | ICD-10-CM | POA: Insufficient documentation

## 2020-07-21 DIAGNOSIS — D125 Benign neoplasm of sigmoid colon: Secondary | ICD-10-CM | POA: Diagnosis not present

## 2020-07-21 DIAGNOSIS — Z79899 Other long term (current) drug therapy: Secondary | ICD-10-CM | POA: Insufficient documentation

## 2020-07-21 DIAGNOSIS — Z1211 Encounter for screening for malignant neoplasm of colon: Secondary | ICD-10-CM | POA: Diagnosis present

## 2020-07-21 DIAGNOSIS — Z880 Allergy status to penicillin: Secondary | ICD-10-CM | POA: Diagnosis not present

## 2020-07-21 DIAGNOSIS — F17210 Nicotine dependence, cigarettes, uncomplicated: Secondary | ICD-10-CM | POA: Insufficient documentation

## 2020-07-21 DIAGNOSIS — Z888 Allergy status to other drugs, medicaments and biological substances status: Secondary | ICD-10-CM | POA: Insufficient documentation

## 2020-07-21 DIAGNOSIS — Z885 Allergy status to narcotic agent status: Secondary | ICD-10-CM | POA: Diagnosis not present

## 2020-07-21 DIAGNOSIS — F319 Bipolar disorder, unspecified: Secondary | ICD-10-CM | POA: Diagnosis not present

## 2020-07-21 DIAGNOSIS — K635 Polyp of colon: Secondary | ICD-10-CM

## 2020-07-21 DIAGNOSIS — R1310 Dysphagia, unspecified: Secondary | ICD-10-CM | POA: Diagnosis not present

## 2020-07-21 DIAGNOSIS — K222 Esophageal obstruction: Secondary | ICD-10-CM | POA: Diagnosis not present

## 2020-07-21 DIAGNOSIS — I10 Essential (primary) hypertension: Secondary | ICD-10-CM | POA: Diagnosis not present

## 2020-07-21 DIAGNOSIS — K621 Rectal polyp: Secondary | ICD-10-CM | POA: Insufficient documentation

## 2020-07-21 HISTORY — PX: ESOPHAGOGASTRODUODENOSCOPY (EGD) WITH PROPOFOL: SHX5813

## 2020-07-21 HISTORY — PX: COLONOSCOPY WITH PROPOFOL: SHX5780

## 2020-07-21 SURGERY — COLONOSCOPY WITH PROPOFOL
Anesthesia: General

## 2020-07-21 MED ORDER — IPRATROPIUM-ALBUTEROL 0.5-2.5 (3) MG/3ML IN SOLN
RESPIRATORY_TRACT | Status: AC
  Filled 2020-07-21: qty 3

## 2020-07-21 MED ORDER — IPRATROPIUM-ALBUTEROL 0.5-2.5 (3) MG/3ML IN SOLN
3.0000 mL | Freq: Once | RESPIRATORY_TRACT | Status: AC
  Administered 2020-07-21: 3 mL via RESPIRATORY_TRACT

## 2020-07-21 MED ORDER — EPHEDRINE SULFATE 50 MG/ML IJ SOLN
INTRAMUSCULAR | Status: DC | PRN
  Administered 2020-07-21: 10 mg via INTRAVENOUS

## 2020-07-21 MED ORDER — DIPHENHYDRAMINE HCL 50 MG/ML IJ SOLN
INTRAMUSCULAR | Status: AC
  Filled 2020-07-21: qty 1

## 2020-07-21 MED ORDER — DIPHENHYDRAMINE HCL 50 MG/ML IJ SOLN
INTRAMUSCULAR | Status: DC | PRN
  Administered 2020-07-21: 25 mg via INTRAVENOUS

## 2020-07-21 MED ORDER — BUTAMBEN-TETRACAINE-BENZOCAINE 2-2-14 % EX AERO
INHALATION_SPRAY | CUTANEOUS | Status: AC
  Filled 2020-07-21: qty 5

## 2020-07-21 MED ORDER — PROPOFOL 10 MG/ML IV BOLUS
INTRAVENOUS | Status: AC
  Filled 2020-07-21: qty 20

## 2020-07-21 MED ORDER — EPHEDRINE 5 MG/ML INJ
INTRAVENOUS | Status: AC
  Filled 2020-07-21: qty 10

## 2020-07-21 MED ORDER — PROPOFOL 500 MG/50ML IV EMUL
INTRAVENOUS | Status: DC | PRN
  Administered 2020-07-21: 150 ug/kg/min via INTRAVENOUS

## 2020-07-21 MED ORDER — PROPOFOL 500 MG/50ML IV EMUL
INTRAVENOUS | Status: AC
  Filled 2020-07-21: qty 50

## 2020-07-21 MED ORDER — SODIUM CHLORIDE 0.9 % IV SOLN: INTRAVENOUS | Status: DC

## 2020-07-21 NOTE — Op Note (Signed)
[facility] Gastroenterology Patient Name: Ronald Lucas Procedure Date: 07/21/2020 8:23 AM MRN: [id number] Account #: [id number] Date of Birth: [birth year] Admit Type: Outpatient Age: 59 Room: [facility] ENDO ROOM 1 Gender: Male Note Status: Finalized Procedure:             Upper GI endoscopy Indications:           Dysphagia Providers:             Jonathon Bellows MD, MD Referring MD:          Bethena Roys. Sowles, MD (Referring MD) Medicines:             Monitored Anesthesia Care Complications:         No immediate complications. Procedure:             Pre-Anesthesia Assessment:                        - Prior to the procedure, a History and Physical was                         performed, and patient medications, allergies and                         sensitivities were reviewed. The patient's tolerance                         of previous anesthesia was reviewed.                        - The risks and benefits of the procedure and the                         sedation options and risks were discussed with the                         patient. All questions were answered and informed                         consent was obtained.                        - ASA Grade Assessment: III - A patient with severe                         systemic disease.                        After obtaining informed consent, the endoscope was                         passed under direct vision. Throughout the procedure,                         the patient's blood pressure, pulse, and oxygen                         saturations were monitored continuously. The Endoscope                         was introduced through the mouth, and advanced  to the                         third part of duodenum. The upper GI endoscopy was                         accomplished with ease. The patient tolerated the                         procedure well. Findings:      The examined duodenum was normal.      The stomach was  normal.      The cardia and gastric fundus were normal on retroflexion.      One benign-appearing, intrinsic mild (non-circumferential scarring)       stenosis was found at the gastroesophageal junction. This stenosis       measured 1.6 cm (inner diameter) x less than one cm (in length). The       stenosis was traversed. A TTS dilator was passed through the scope.       Dilation with a 15-16.5-18 mm balloon dilator was performed to 18 mm.       The dilation site was examined and showed no change.      Normal mucosa was found in the entire esophagus. Biopsies were taken       with a cold forceps for histology. Impression:            - Normal examined duodenum.                        - Normal stomach.                        - Benign-appearing esophageal stenosis. Dilated.                        - Normal mucosa was found in the entire esophagus.                         Biopsied. Recommendation:        - Await pathology results.                        - Perform a colonoscopy today. Procedure Code(s):     --- Professional ---                        [phone], Esophagogastroduodenoscopy, flexible,                         transoral; with transendoscopic balloon dilation of                         esophagus (less than 30 mm diameter)                        43239, 59, Esophagogastroduodenoscopy, flexible,                         transoral; with biopsy, single or multiple Diagnosis Code(s):     --- Professional ---  K22.2, Esophageal obstruction                        R13.10, Dysphagia, unspecified CPT copyright 2019 American Medical Association. All rights reserved. The codes documented in this report are preliminary and upon coder review may  be revised to meet current compliance requirements. Jonathon Bellows, MD Jonathon Bellows MD, MD 07/21/2020 9:02:22 AM This report has been signed electronically. Number of Addenda: 0 Note Initiated On: 07/21/2020 8:23 AM Estimated Blood Loss:   Estimated blood loss: none.      [facility]

## 2020-07-21 NOTE — Op Note (Signed)
[facility] Gastroenterology Patient Name: Ronald Lucas Procedure Date: 07/21/2020 8:22 AM MRN: [id number] Account #: [id number] Date of Birth: 1961-07-10 Admit Type: Outpatient Age: 59 Room: [facility] ENDO ROOM 1 Gender: Male Note Status: Finalized Procedure:             Colonoscopy Indications:           Screening for colorectal malignant neoplasm Providers:             Jonathon Bellows MD, MD Referring MD:          Bethena Roys. Sowles, MD (Referring MD) Medicines:             Monitored Anesthesia Care Complications:         No immediate complications. Procedure:             Pre-Anesthesia Assessment:                        - Prior to the procedure, a History and Physical was                         performed, and patient medications, allergies and                         sensitivities were reviewed. The patient's tolerance                         of previous anesthesia was reviewed.                        - The risks and benefits of the procedure and the                         sedation options and risks were discussed with the                         patient. All questions were answered and informed                         consent was obtained.                        - ASA Grade Assessment: III - A patient with severe                         systemic disease.                        After obtaining informed consent, the colonoscope was                         passed under direct vision. Throughout the procedure,                         the patient's blood pressure, pulse, and oxygen                         saturations were monitored continuously. The                         Colonoscope was introduced through the anus  and                         advanced to the the cecum, identified by the                         appendiceal orifice. The colonoscopy was performed                         with ease. The patient tolerated the procedure well.                         The  quality of the bowel preparation was excellent. Findings:      The perianal and digital rectal examinations were normal.      Three sessile polyps were found in the rectum. The polyps were 4 to 5 mm       in size. These polyps were removed with a cold snare. Resection and       retrieval were complete.      A 12 mm polyp was found in the sigmoid colon. The polyp was       pedunculated. The polyp was removed with a hot snare. Resection and       retrieval were complete. To prevent bleeding after the polypectomy, one       hemostatic clip was successfully placed. There was no bleeding during,       or at the end, of the procedure.      The exam was otherwise without abnormality on direct and retroflexion       views. Impression:            - Three 4 to 5 mm polyps in the rectum, removed with a                         cold snare. Resected and retrieved.                        - One 12 mm polyp in the sigmoid colon, removed with a                         hot snare. Resected and retrieved. Clip was placed.                        - The examination was otherwise normal on direct and                         retroflexion views. Recommendation:        - Discharge patient to home (with escort).                        - Resume previous diet.                        - Continue present medications.                        - Await pathology results.                        - Repeat colonoscopy for surveillance based on  pathology results. Procedure Code(s):     --- Professional ---                        [phone], Colonoscopy, flexible; with removal of                         tumor(s), polyp(s), or other lesion(s) by snare                         technique Diagnosis Code(s):     --- Professional ---                        Z12.11, Encounter for screening for malignant neoplasm                         of colon                        K62.1, Rectal polyp                        K63.5,  Polyp of colon CPT copyright 2019 American Medical Association. All rights reserved. The codes documented in this report are preliminary and upon coder review may  be revised to meet current compliance requirements. Jonathon Bellows, MD Jonathon Bellows MD, MD 07/21/2020 9:23:00 AM This report has been signed electronically. Number of Addenda: 0 Note Initiated On: 07/21/2020 8:22 AM Scope Withdrawal Time: 0 hours 15 minutes 9 seconds  Total Procedure Duration: 0 hours 16 minutes 26 seconds  Estimated Blood Loss:  Estimated blood loss: none.      [facility]

## 2020-07-21 NOTE — Anesthesia Preprocedure Evaluation (Signed)
Anesthesia Evaluation  Patient identified by MRN, date of birth, ID band Patient awake    Reviewed: Allergy & Precautions, H&P , NPO status , Patient's Chart, lab work & pertinent test results, reviewed documented beta blocker date and time   Airway Mallampati: III   Neck ROM: full    Dental  (+) Poor Dentition, Teeth Intact   Pulmonary asthma , COPD, Current Smoker,    Pulmonary exam normal        Cardiovascular Exercise Tolerance: Poor hypertension, On Medications negative cardio ROS Normal cardiovascular exam Rhythm:regular Rate:Normal     Neuro/Psych PSYCHIATRIC DISORDERS Anxiety Depression Bipolar Disorder  Neuromuscular disease    GI/Hepatic negative GI ROS, Neg liver ROS,   Endo/Other  Morbid obesity  Renal/GU negative Renal ROS  negative genitourinary   Musculoskeletal   Abdominal   Peds  Hematology negative hematology ROS (+)   Anesthesia Other Findings Past Medical History: No date: ADHD No date: Allergy No date: Asthma No date: Bipolar 1 disorder (HCC) No date: Depression No date: Hypertension No date: Panic attacks No date: Panic disorder No date: PTSD (post-traumatic stress disorder) Past Surgical History: No date: CARDIAC SURGERY No date: FRACTURE SURGERY BMI    Body Mass Index: 41.84 kg/m     Reproductive/Obstetrics negative OB ROS                             Anesthesia Physical Anesthesia Plan  ASA: III  Anesthesia Plan: General   Post-op Pain Management:    Induction:   PONV Risk Score and Plan:   Airway Management Planned:   Additional Equipment:   Intra-op Plan:   Post-operative Plan:   Informed Consent: I have reviewed the patients History and Physical, chart, labs and discussed the procedure including the risks, benefits and alternatives for the proposed anesthesia with the patient or authorized representative who has indicated his/her  understanding and acceptance.     Dental Advisory Given  Plan Discussed with: CRNA  Anesthesia Plan Comments:         Anesthesia Quick Evaluation

## 2020-07-21 NOTE — Anesthesia Procedure Notes (Signed)
Performed by: Vaughan Sine Pre-anesthesia Checklist: Patient identified, Emergency Drugs available, Patient being monitored, Suction available and Timeout performed Patient Re-evaluated:Patient Re-evaluated prior to induction Oxygen Delivery Method: Supernova nasal CPAP Preoxygenation: Pre-oxygenation with 100% oxygen Induction Type: IV induction Airway Equipment and Method: Bite block Placement Confirmation: positive ETCO2 and CO2 detector

## 2020-07-21 NOTE — Transfer of Care (Signed)
Immediate Anesthesia Transfer of Care Note  Patient: Ronald Lucas  Procedure(s) Performed: COLONOSCOPY WITH PROPOFOL (N/A ) ESOPHAGOGASTRODUODENOSCOPY (EGD) WITH PROPOFOL (N/A )  Patient Location: PACU  Anesthesia Type:General  Level of Consciousness: awake and sedated  Airway & Oxygen Therapy: Patient Spontanous Breathing and Patient connected to face mask oxygen  Post-op Assessment: Report given to RN and Post -op Vital signs reviewed and stable  Post vital signs: Reviewed and stable  Last Vitals:  Vitals Value Taken Time  BP    Temp    Pulse    Resp    SpO2      Last Pain:  Vitals:   07/21/20 0800  TempSrc: Temporal  PainSc: 10-Worst pain ever         Complications: No complications documented.

## 2020-07-21 NOTE — H&P (Signed)
Jonathon Bellows, MD [address] [place], [place], [place], [address] [address], [place], [place], [place], [address] Phone: [phone]  Fax: [phone]  Primary Care Physician:  Steele Sizer, MD   Pre-Procedure History & Physical: HPI:  Ronald Lucas is a 59 y.o. male is here for an endoscopy and colonoscopy    Past Medical History:  Diagnosis Date  . ADHD   . Allergy   . Asthma   . Bipolar 1 disorder ([place])   . Depression   . Hypertension   . Panic attacks   . Panic disorder   . PTSD (post-traumatic stress disorder)     Past Surgical History:  Procedure Laterality Date  . CARDIAC SURGERY    . FRACTURE SURGERY      Prior to Admission medications   Medication Sig Start Date End Date Taking? Authorizing Provider  ALPRAZolam (XANAX) 0.25 MG tablet Take 0.25 mg by mouth 2 (two) times daily as needed for anxiety.   Yes Alvy Bimler, MD  Ipratropium-Albuterol (COMBIVENT) 20-100 MCG/ACT AERS respimat Inhale 1 puff into the lungs 4 (four) times daily as needed for wheezing. 04/05/20  Yes Sowles, Drue Stager, MD  lisinopril (ZESTRIL) 5 MG tablet Take 1 tablet (5 mg total) by mouth daily. Patient wants to stay on medication 06/29/20  Yes Sowles, Drue Stager, MD  dapagliflozin propanediol (FARXIGA) 10 MG TABS tablet Take 1 tablet (10 mg total) by mouth daily before breakfast. Patient not taking: Reported on 07/21/2020 06/29/20   Steele Sizer, MD  levocetirizine (XYZAL) 5 MG tablet Take 1 tablet (5 mg total) by mouth every evening. Patient not taking: Reported on 07/21/2020 06/30/20   Steele Sizer, MD  mometasone (ELOCON) 0.1 % ointment Apply topically daily. Apply to legs twice a day for 2 weeks, then decrease to 5 nights a week Patient not taking: Reported on 07/21/2020 04/20/20   Ralene Bathe, MD  omeprazole (PRILOSEC) 40 MG capsule Take 1 capsule (40 mg total) by mouth daily. Patient not taking: Reported on 07/21/2020 06/30/20   Jonathon Bellows, MD  rosuvastatin  (CRESTOR) 5 MG tablet Take 1 tablet (5 mg total) by mouth daily. Patient not taking: Reported on 07/21/2020 06/29/20   Steele Sizer, MD    Allergies as of 07/01/2020 - Review Complete 06/30/2020  Allergen Reaction Noted  . Peanut oil Anaphylaxis 07/19/2017  . Peanut-containing drug products Anaphylaxis 07/19/2017  . Penicillins Hives 05/28/2011  . Tetanus toxoids  05/28/2011  . Buspar [buspirone] Other (See Comments) 06/14/2015  . Flexeril [cyclobenzaprine] Other (See Comments), Nausea Only, and Nausea And Vomiting 07/04/2015  . Gabapentin Nausea And Vomiting 07/19/2017  . Methocarbamol Nausea Only 07/19/2017  . Morphine and related Other (See Comments) 06/14/2015  . Norvasc [amlodipine besylate]  03/25/2019  . Prednisone  06/14/2015  . Lisinopril  03/25/2019    Family History  Problem Relation Age of Onset  . Colon cancer Sister     Social History   Socioeconomic History  . Marital status: Single    Spouse name: Not on file  . Number of children: 0  . Years of education: Not on file  . Highest education level: Not on file  Occupational History  . Occupation: disabled  Tobacco Use  . Smoking status: Current Every Day Smoker    Packs/day: 0.10    Types: Cigarettes    Start date: 03/17/1983  . Smokeless tobacco: Never Used  Vaping Use  . Vaping Use: Never used  Substance and Sexual Activity  .  Alcohol use: No  . Drug use: No  . Sexual activity: Not Currently  Other Topics Concern  . Not on file  Social History Narrative  . Not on file   Social Determinants of Health   Financial Resource Strain:   . Difficulty of Paying Living Expenses: Not on file  Food Insecurity:   . Worried About Charity fundraiser in the Last Year: Not on file  . Ran Out of Food in the Last Year: Not on file  Transportation Needs:   . Lack of Transportation (Medical): Not on file  . Lack of Transportation (Non-Medical): Not on file  Physical Activity:   . Days of Exercise per Week:  Not on file  . Minutes of Exercise per Session: Not on file  Stress:   . Feeling of Stress : Not on file  Social Connections:   . Frequency of Communication with Friends and Family: Not on file  . Frequency of Social Gatherings with Friends and Family: Not on file  . Attends Religious Services: Not on file  . Active Member of Clubs or Organizations: Not on file  . Attends Archivist Meetings: Not on file  . Marital Status: Not on file  Intimate Partner Violence:   . Fear of Current or Ex-Partner: Not on file  . Emotionally Abused: Not on file  . Physically Abused: Not on file  . Sexually Abused: Not on file    Review of Systems: See HPI, otherwise negative ROS  Physical Exam: BP (!) 145/88   Pulse 99   Temp (!) 96.6 F (35.9 C) (Temporal)   Resp 18   Ht 5\' 11"  (1.803 m)   Wt 136.1 kg   SpO2 99%   BMI 41.84 kg/m  General:   Alert,  pleasant and cooperative in NAD Head:  Normocephalic and atraumatic. Neck:  Supple; no masses or thyromegaly. Lungs:  Clear throughout to auscultation, normal respiratory effort.    Heart:  +S1, +S2, Regular rate and rhythm, No edema. Abdomen:  Soft, nontender and nondistended. Normal bowel sounds, without guarding, and without rebound.   Neurologic:  Alert and  oriented x4;  grossly normal neurologically.  Impression/Plan: Ronald Lucas is here for an endoscopy and colonoscopy  to be performed for  evaluation of dysphagia and colon cancer screening     Risks, benefits, limitations, and alternatives regarding endoscopy have been reviewed with the patient.  Questions have been answered.  All parties agreeable.   Jonathon Bellows, MD  07/21/2020, 8:14 AM

## 2020-07-22 ENCOUNTER — Encounter: Payer: Self-pay | Admitting: Gastroenterology

## 2020-07-22 LAB — SURGICAL PATHOLOGY

## 2020-08-01 ENCOUNTER — Telehealth: Payer: Self-pay | Admitting: Gastroenterology

## 2020-08-01 ENCOUNTER — Encounter: Payer: Self-pay | Admitting: Gastroenterology

## 2020-08-01 NOTE — Telephone Encounter (Signed)
Patient wanting to know results ov colon biopsy done on 10.21.21 and pt also states medication omeprazole is not working and wants to know if there is something else he can try. Please advise and let pt know.

## 2020-08-02 ENCOUNTER — Encounter: Payer: Self-pay | Admitting: Gastroenterology

## 2020-08-02 NOTE — Telephone Encounter (Signed)
Colon polyp : adenoma- repeat colonoscopy in 3 years EGD: biopsies normal   Plan If omeprazole 40 mg once daily not working then icnrease to BID and if that doesn't work try [facility]

## 2020-08-03 ENCOUNTER — Encounter: Payer: Self-pay | Admitting: Student in an Organized Health Care Education/Training Program

## 2020-08-03 ENCOUNTER — Other Ambulatory Visit: Payer: Self-pay

## 2020-08-03 ENCOUNTER — Ambulatory Visit
Payer: Medicaid Other | Attending: Student in an Organized Health Care Education/Training Program | Admitting: Student in an Organized Health Care Education/Training Program

## 2020-08-03 VITALS — BP 163/94 | HR 85 | Temp 97.7°F | Resp 18 | Ht 71.0 in | Wt 300.0 lb

## 2020-08-03 DIAGNOSIS — M47816 Spondylosis without myelopathy or radiculopathy, lumbar region: Secondary | ICD-10-CM | POA: Diagnosis not present

## 2020-08-03 DIAGNOSIS — F3162 Bipolar disorder, current episode mixed, moderate: Secondary | ICD-10-CM | POA: Diagnosis not present

## 2020-08-03 DIAGNOSIS — G8929 Other chronic pain: Secondary | ICD-10-CM

## 2020-08-03 DIAGNOSIS — F609 Personality disorder, unspecified: Secondary | ICD-10-CM | POA: Diagnosis not present

## 2020-08-03 DIAGNOSIS — Z6841 Body Mass Index (BMI) 40.0 and over, adult: Secondary | ICD-10-CM | POA: Diagnosis not present

## 2020-08-03 DIAGNOSIS — F410 Panic disorder [episodic paroxysmal anxiety] without agoraphobia: Secondary | ICD-10-CM

## 2020-08-03 DIAGNOSIS — M5136 Other intervertebral disc degeneration, lumbar region: Secondary | ICD-10-CM

## 2020-08-03 DIAGNOSIS — M545 Low back pain, unspecified: Secondary | ICD-10-CM

## 2020-08-03 NOTE — Progress Notes (Signed)
Safety precautions to be maintained throughout the outpatient stay will include: orient to surroundings, keep bed in low position, maintain call bell within reach at all times, provide assistance with transfer out of bed and ambulation.  

## 2020-08-03 NOTE — Telephone Encounter (Signed)
Pt has been notified of results and Dr. Georgeann Oppenheim recommendations. Pt states the omeprazole has caused diarrhea so he agrees to try the [place]. Pt plans to pick up the Dexilant samples from our office this week.

## 2020-08-03 NOTE — Progress Notes (Signed)
Patient: Ronald Lucas  Service Category: E/M  Provider: Gillis Santa, MD  DOB: 11-06-1960  DOS: 08/03/2020  Referring Provider: Steele Sizer, MD  MRN: [id number]  Setting: Ambulatory outpatient  PCP: Steele Sizer, MD  Type: New Patient  Specialty: Interventional Pain Management    Location: Office  Delivery: Face-to-face     Primary Reason(s) for Visit: Encounter for initial evaluation of one or more chronic problems (new to examiner) potentially causing chronic pain, and posing a threat to normal musculoskeletal function. (Level of risk: High) CC: Back Pain (low)  HPI  [name] is a 59 y.o. year old, male patient, who comes for the first time to our practice referred by Steele Sizer, MD for our initial evaluation of his chronic pain. 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 Today he comes in for evaluation of his Back Pain (low)  Pain Assessment: Location: Lower Back Radiating: legs to toes, whole leg Onset: More than a month ago Duration: Chronic pain Quality: Constant, Throbbing, Shooting Severity: 10-Worst pain ever/10 (subjective, self-reported pain score)  Effect on ADL:   Timing: Constant Modifying factors:   BP: (!) 163/94   HR: 85  Onset and Duration: Present longer than 3 months Cause of pain: Unknown Severity: Getting worse, NAS-11 at its worse: 15/10, NAS-11 at its best: 10/10, NAS-11 now: 5/10 and NAS-11 on the average: 10/10 Timing: Not influenced by the time of the day Aggravating  Factors: Climbing, Kneeling, Lifiting, Prolonged standing, Squatting, Stooping , Twisting, Walking and Working Alleviating Factors: Lying down and Medications Associated Problems: Day-time cramps, Night-time cramps, Depression, Fatigue, Numbness, Personality changes, Sadness, Spasms, Tingling, Weakness and Pain that wakes patient up Quality of Pain: Aching, Agonizing, Constant, Cramping, Deep, Disabling, Distressing, Feeling of constriction, Feeling of weight, Heavy, Pressure-like, Pulsating, Punishing, Sharp, Shooting, Sickening, Stabbing, Throbbing, Tingling and Tiring Previous Examinations or Tests: CT scan, MRI scan, X-rays and Psychiatric evaluation Previous Treatments: Narcotic medications  Ronald Lucas is a 59 year old male, current smoker with a history of neuropathy who presents with the chief complaint of back, leg, foot pain has been going on for approximately 6 years.  It has gotten worse over the last 6 years.  He states that back pain is constant sharp, aching, stabbing, shocking, throbbing, cramping with numbness at the lateral aspect of thighs bilaterally. Also complains of hypersensitivity of the bottom of both feet as well as pain in bilateral ankle, bilateral knees, and bilateral hips Denies bladder or bowel dysfunction, saddle paresthesia, weakness, recent falls, upper extremity symptoms aside from hand symptoms, radicular pain extending past the knees. Received ESI in the 90s and states that he does not remember it helping with pain.  Patient has multiple allergies to medications including none opioid analgesics.  He states that "steroids made him blow up" and also made him crazy that he almost had to be admitted to the psychiatric ward he states.  He is under the care of his psychiatrist.  He does have a history of bipolar disorder.  He is on daily alprazolam for panic disorder.  He states that he has done physical therapy for this in the past.  He states  that he is being referred here for  "pain medication".  Historic Controlled Substance Pharmacotherapy Review  Historical Monitoring: The patient  reports no history of drug use. List of all UDS Test(s): No results found for: MDMA, COCAINSCRNUR, [place], [place], CANNABQUANT, THCU, [place] List of other Serum/Urine Drug Screening Test(s):  No results found for: AMPHSCRSER, BARBSCRSER, BENZOSCRSER, COCAINSCRSER, COCAINSCRNUR, PCPSCRSER, PCPQUANT, THCSCRSER, THCU, CANNABQUANT, OPIATESCRSER, OXYSCRSER, PROPOXSCRSER, ETH Historical Background Evaluation: [place] PMP: PDMP reviewed during this encounter. Online review of the past 15-monthperiod conducted.             [place] Department of public safety, offender search: (Editor, commissioningInformation) Non-contributory Risk Assessment Profile: Risk factors for fatal opioid overdose: Benzodiazepine use, concomitant use of Benzodiazepines and History of bipolar disorder and severe anxiety, on daily alprazolam.  Morbid obesity, review of systems positive for snoring concerning for obstructive sleep apnea Fatal overdose hazard ratio (HR): Calculation deferred Non-fatal overdose hazard ratio (HR): Calculation deferred Risk of opioid abuse or dependence: 0.7-3.0% with doses ? 36 MME/day and 6.1-26% with doses ? 120 MME/day. Substance use disorder (SUD) risk level: High   Pharmacologic Plan: No opioid analgesics.            Initial impression: Poor candidate for opioid analgesics.  Meds   Current Outpatient Medications:    ALPRAZolam (XANAX) 0.25 MG tablet, Take 0.25 mg by mouth 2 (two) times daily as needed for anxiety., Disp: , Rfl:    Ipratropium-Albuterol (COMBIVENT) 20-100 MCG/ACT AERS respimat, Inhale 1 puff into the lungs 4 (four) times daily as needed for wheezing., Disp: 4 g, Rfl: 2   lisinopril (ZESTRIL) 5 MG tablet, Take 1 tablet (5 mg total) by mouth daily. Patient wants to stay on medication, Disp: 90 tablet, Rfl: 0   dapagliflozin propanediol (FARXIGA) 10 MG TABS tablet, Take 1 tablet (10  mg total) by mouth daily before breakfast. (Patient not taking: Reported on 07/21/2020), Disp: 30 tablet, Rfl: 2   levocetirizine (XYZAL) 5 MG tablet, Take 1 tablet (5 mg total) by mouth every evening. (Patient not taking: Reported on 07/21/2020), Disp: 30 tablet, Rfl: 2   mometasone (ELOCON) 0.1 % ointment, Apply topically daily. Apply to legs twice a day for 2 weeks, then decrease to 5 nights a week (Patient not taking: Reported on 07/21/2020), Disp: 45 g, Rfl: 1   omeprazole (PRILOSEC) 40 MG capsule, Take 1 capsule (40 mg total) by mouth daily. (Patient not taking: Reported on 07/21/2020), Disp: 90 capsule, Rfl: 1   rosuvastatin (CRESTOR) 5 MG tablet, Take 1 tablet (5 mg total) by mouth daily. (Patient not taking: Reported on 07/21/2020), Disp: 30 tablet, Rfl: 2  Imaging Review  Lumbosacral Imaging: Lumbar MR wo contrast: Results for orders placed during the hospital encounter of 12/12/17  MR LUMBAR SPINE WO CONTRAST  Narrative CLINICAL DATA:  Low back pain and bilateral leg pain extending to the feet.  EXAM: MRI LUMBAR SPINE WITHOUT CONTRAST  TECHNIQUE: Multiplanar, multisequence MR imaging of the lumbar spine was performed. No intravenous contrast was administered.  COMPARISON:  CT 06/14/2015  FINDINGS: Segmentation: L5 is a transitional vertebra. Rudimentary twelfth ribs. This is correlated with cervical spine and thoracic radiography.  Alignment:  Normal  Vertebrae:  Normal  Conus medullaris and cauda equina: Conus extends to the T12 level. Conus and cauda equina appear normal.  Paraspinal and other soft tissues: Normal  Disc levels:  T12-L1 and L1-2 are normal.  L2-3: Minimal desiccation and bulging of the disc. No stenosis or neural compression.  L3-4: Degeneration and bulging of the disc. Mild facet hypertrophy. No compressive stenosis.  L4-5: Degeneration and bulging of the disc. Mild facet hypertrophy. No compressive stenosis.  L5-S1: Transitional  level. Canal and foramina widely patent. Anomalous articulations could be associated with low back pain.  IMPRESSION: L5 is a transitional vertebra, as numbered previously.  Degenerative disc disease at L2-3, L3-4 and L4-5 with disc bulges but no herniation or compressive stenosis. Mild facet hypertrophy at L3-4 and L4-5. The findings could be associated with back pain. No neural compression is seen.  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   Electronically Signed By: Nelson Chimes M.D. On: 12/12/2017 11:48  Narrative CLINICAL DATA:  Severe lumbago/low back pain. Acute onset of lumbago/low back pain. Onset of symptoms today. Audible pop in the low back.  EXAM: CT LUMBAR SPINE WITHOUT CONTRAST  TECHNIQUE: Multidetector CT imaging of the lumbar spine was performed without intravenous contrast administration. Multiplanar CT image reconstructions were also generated.  COMPARISON:  06/14/2015.  01/27/2015.  FINDINGS: Segmentation: Lumbosacral transitional anatomy is present. The RIGHT L5 transverse process is solidly fused to the sacrum. Pseudoarthrosis of the LEFT L5 transverse process with the sacrum proper. Rudimentary T12 ribs are present.  Alignment:  Anatomic.  Vertebrae:  No aggressive osseous lesion or fracture.  Paraspinal tissues: Aortoiliac atherosclerosis. Sacroiliac joints appear within normal limits. No acute retroperitoneal abnormality.  Disc levels:  T12-L1:  Negative.  L1-L2:  Negative.  L2-L3:  Shallow circumferential disc bulging.  No bony stenosis.  L3-L4: Chronic disc degeneration. LEFT eccentric disc protrusion is present with endplate osteophytes producing LEFT foraminal stenosis. Central canal appears adequately patent. RIGHT foramen is also mildly stenotic associated with endplate osteophytes.  L4-L5: Central disc protrusion is present producing at least mild and  probably moderate central stenosis. Mild bilateral foraminal stenosis secondary to short pedicles and disc bulging.  L5-S1: Rudimentary disc. Narrowing of the RIGHT neural foramen around the RIGHT L5 nerve secondary to transitional anatomy is likely chronic/ developmental and of doubtful significance.  IMPRESSION: 1. No acute osseous abnormality. 2. L4-L5 predominant lumbar degenerative disc disease with central disc protrusion at L4-L5 producing at least mild and probably moderate central stenosis. Consider MRI for further assessment.   Electronically Signed By: Dereck Ligas M.D. On: 06/14/2015 16:23   Narrative CLINICAL DATA:  Severe low back pain, acute onset today.  EXAM: LUMBAR SPINE - 2-3 VIEW  COMPARISON:  CT scan abdomen and pelvis dated 01/27/2015  FINDINGS: There is no fracture or bone destruction. 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 The disc spaces are well maintained throughout the remainder of the lumbar spine. No subluxation. No facet arthritis.  IMPRESSION: No acute abnormality.   Electronically Signed By: Lorriane Shire M.D. On: 06/14/2015 14:35   ROS  Cardiovascular: High blood pressure Pulmonary or Respiratory: Wheezing and difficulty taking a deep full breath (Asthma), Smoking, Snoring  and Temporary stoppage of breathing during sleep Neurological: No reported neurological signs or symptoms such as seizures, abnormal skin sensations, urinary and/or fecal incontinence, being born with an abnormal open spine and/or a tethered spinal cord Psychological-Psychiatric: Psychiatric disorder, Anxiousness, Depressed, Prone to panicking, Suicidal ideations and Attempted suicide Gastrointestinal: Reflux or heatburn Genitourinary: No reported renal or genitourinary signs or symptoms such as difficulty voiding or producing urine, peeing blood, non-functioning kidney, kidney stones, difficulty  emptying the bladder, difficulty  controlling the flow of urine, or chronic kidney disease Hematological: No reported hematological signs or symptoms such as prolonged bleeding, low or poor functioning platelets, bruising or bleeding easily, hereditary bleeding problems, low energy levels due to low hemoglobin or being anemic Endocrine: No reported endocrine signs or symptoms such as high or low blood sugar, rapid heart rate due to high thyroid levels, obesity or weight gain due to slow thyroid or thyroid disease Rheumatologic: No reported rheumatological signs and symptoms such as fatigue, joint pain, tenderness, swelling, redness, heat, stiffness, decreased range of motion, with or without associated rash Musculoskeletal: Negative for myasthenia gravis, muscular dystrophy, multiple sclerosis or malignant hyperthermia Work History: Disabled  Allergies  Mr. Hardenbrook is allergic to peanut oil, peanut-containing drug products, penicillins, tetanus toxoids, buspar [buspirone], flexeril [cyclobenzaprine], gabapentin, methocarbamol, morphine and related, norvasc [amlodipine besylate], prednisone, and lisinopril.  Laboratory Chemistry Profile   Renal Lab Results  Component Value Date   BUN 12 04/05/2020   CREATININE 1.12 04/05/2020   LABCREA 132 09/32/3557   BCR NOT APPLICABLE 32/20/2542   GFRAA 83 04/05/2020   GFRNONAA 72 04/05/2020   PROTEINUR Negative 01/27/2015     Electrolytes Lab Results  Component Value Date   NA 135 04/05/2020   K 4.2 04/05/2020   CL 101 04/05/2020   CALCIUM 9.7 04/05/2020   MG 1.9 03/09/2019     Hepatic Lab Results  Component Value Date   AST 36 (H) 04/05/2020   ALT 36 04/05/2020   ALBUMIN 4.2 06/15/2017   ALKPHOS 43 06/15/2017   LIPASE 30 01/27/2015     ID Lab Results  Component Value Date   HIV NON-REACTIVE 03/09/2019   Fults NEGATIVE 07/19/2020     Bone No results found for: VD25OH, VD125OH2TOT, HC6237SE8, BT5176HY0, 25OHVITD1, 25OHVITD2,  25OHVITD3, TESTOFREE, TESTOSTERONE   Endocrine Lab Results  Component Value Date   GLUCOSE 167 (H) 04/05/2020   GLUCOSEU Negative 01/27/2015   HGBA1C 7.2 (H) 04/05/2020   TSH 2.30 03/09/2019     Neuropathy Lab Results  Component Value Date   HGBA1C 7.2 (H) 04/05/2020   HIV NON-REACTIVE 03/09/2019     CNS No results found for: COLORCSF, APPEARCSF, RBCCOUNTCSF, WBCCSF, POLYSCSF, LYMPHSCSF, EOSCSF, PROTEINCSF, GLUCCSF, JCVIRUS, CSFOLI, IGGCSF, LABACHR, ACETBL, LABACHR, ACETBL   Inflammation (CRP: Acute   ESR: Chronic) No results found for: CRP, ESRSEDRATE, LATICACIDVEN   Rheumatology No results found for: RF, ANA, LABURIC, URICUR, LYMEIGGIGMAB, LYMEABIGMQN, HLAB27   Coagulation Lab Results  Component Value Date   INR 0.9 02/21/2012   LABPROT 12.7 02/21/2012   PLT 230 04/05/2020     Cardiovascular Lab Results  Component Value Date   BNP 18.0 05/24/2019   CKTOTAL 183 02/21/2012   CKMB 0.8 02/21/2012   TROPONINI <0.03 06/15/2017   HGB 15.8 04/05/2020   HCT 45.8 04/05/2020     Screening Lab Results  Component Value Date   SARSCOV2NAA NEGATIVE 07/19/2020   HIV NON-REACTIVE 03/09/2019     Cancer No results found for: CEA, CA125, LABCA2   Allergens No results found for: ALMOND, APPLE, ASPARAGUS, AVOCADO, BANANA, BARLEY, BASIL, BAYLEAF, GREENBEAN, LIMABEAN, WHITEBEAN, BEEFIGE, REDBEET, BLUEBERRY, BROCCOLI, CABBAGE, MELON, CARROT, CASEIN, CASHEWNUT, CAULIFLOWER, CELERY     Note: Lab results reviewed.  [place]  Drug: Mr. Grieser  reports no history of drug use. Alcohol:  reports no history of alcohol use. Tobacco:  reports that he has been smoking cigarettes. He started smoking about 37 years ago. He has been smoking about 0.10 packs per day. He has  never used smokeless tobacco. Medical:  has a past medical history of ADHD, Allergy, Asthma, Bipolar 1 disorder ([place]), Depression, Hypertension, Panic attacks, Panic disorder, and PTSD (post-traumatic stress disorder). Family:  family history includes Colon cancer in his sister.  Past Surgical History:  Procedure Laterality Date   CARDIAC SURGERY     COLONOSCOPY WITH PROPOFOL N/A 07/21/2020   Procedure: COLONOSCOPY WITH PROPOFOL;  Surgeon: Jonathon Bellows, MD;  Location: [facility] ENDOSCOPY;  Service: Gastroenterology;  Laterality: N/A;   ESOPHAGOGASTRODUODENOSCOPY (EGD) WITH PROPOFOL N/A 07/21/2020   Procedure: ESOPHAGOGASTRODUODENOSCOPY (EGD) WITH PROPOFOL;  Surgeon: Jonathon Bellows, MD;  Location: [facility] ENDOSCOPY;  Service: Gastroenterology;  Laterality: N/A;   FRACTURE SURGERY     Active Ambulatory Problems    Diagnosis Date Noted   Chronic bilateral low back pain without sciatica 07/19/2017   Skin tag 07/19/2017   Hypertension 05/28/2011   Multiple allergies 07/19/2017   Bipolar disorder, current episode mixed, moderate ([place]) 07/19/2017   Panic disorder 07/19/2017   BMI 40.0-44.9, adult ([place]) 07/19/2017   Vascular calcification 07/19/2017   COPD with asthma ([place]) 05/09/2018   Complex regional pain syndrome i of right lower limb 01/18/2016   Dupuytren's contracture 01/18/2016   PTSD (post-traumatic stress disorder) 05/09/2018   ADD (attention deficit disorder) 03/17/2019   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 In no apparent acute distress Vitals:   08/03/20 1406  BP: (!) 163/94  Pulse: 85  Resp: 18  Temp: 97.7 F (36.5 C)  TempSrc: Temporal  SpO2: 98%  Weight: 300 lb (136.1 kg)  Height: 5' 11"  (1.803 m)   BMI Assessment: Estimated body mass index  is 41.84 kg/m as calculated from the following:   Height as of this encounter: 5' 11"  (1.803 m).   Weight as of this encounter: 300 lb (136.1 kg).  BMI interpretation table: BMI level Category Range association with higher incidence of chronic pain  <18 kg/m2 Underweight   18.5-24.9 kg/m2 Ideal body weight   25-29.9 kg/m2 Overweight Increased incidence by 20%  30-34.9 kg/m2 Obese (Class I) Increased incidence by 68%  35-39.9 kg/m2 Severe obesity (Class II) Increased incidence by 136%  >40 kg/m2 Extreme obesity (Class III) Increased incidence by 254%   Patient's current BMI Ideal Body weight  Body mass index is 41.84 kg/m. Ideal body weight: 75.3 kg (166 lb 0.1 oz) Adjusted ideal body weight: 99.6 kg (219 lb 9.7 oz)   BMI Readings from Last 4 Encounters:  08/03/20 41.84 kg/m  07/21/20 41.84 kg/m  06/30/20 43.24 kg/m  06/29/20 43.54 kg/m   Wt Readings from Last 4 Encounters:  08/03/20 300 lb (136.1 kg)  07/21/20 300 lb (136.1 kg)  06/30/20 (!) 310 lb (140.6 kg)  06/29/20 (!) 312 lb 3.2 oz (141.6 kg)    Psych/Mental status: Alert, oriented x 3 (person, place, & time)       Eyes: PERLA Respiratory: No evidence of acute respiratory distress  Cervical Spine Exam  Skin & Axial Inspection: No masses, redness, edema, swelling, or associated skin lesions  Alignment: Symmetrical Functional ROM: Unrestricted ROM      Stability: No instability detected Muscle Tone/Strength: Functionally intact. No obvious neuro-muscular anomalies detected. Sensory (Neurological): Unimpaired Palpation: No palpable anomalies              Upper Extremity (UE) Exam    Side: Right upper extremity  Side: Left upper extremity  Skin & Extremity Inspection: Skin color, temperature, and hair growth are WNL. No peripheral edema or cyanosis. No masses, redness, swelling, asymmetry, or associated skin lesions. No contractures.  Skin & Extremity Inspection: Skin color, temperature, and hair growth are WNL. No  peripheral edema or cyanosis. No masses, redness, swelling, asymmetry, or associated skin lesions. No contractures.  Functional ROM: Unrestricted ROM          Functional ROM: Unrestricted ROM          Muscle Tone/Strength: Functionally intact. No obvious neuro-muscular anomalies detected.   Muscle Tone/Strength: Functionally intact. No obvious neuro-muscular anomalies detected.  Sensory (Neurological): Unimpaired          Sensory (Neurological): Unimpaired          Palpation: No palpable anomalies              Palpation: No palpable anomalies              Provocative Test(s):  Phalen's test: deferred Tinel's test: deferred Apley's scratch test (touch opposite shoulder):  Action 1 (Across chest): deferred Action 2 (Overhead): deferred Action 3 (LB reach): deferred   Provocative Test(s):  Phalen's test: deferred Tinel's test: deferred Apley's scratch test (touch opposite shoulder):  Action 1 (Across chest): deferred Action 2 (Overhead): deferred Action 3 (LB reach): deferred    Thoracic Spine Area Exam  Skin & Axial Inspection: No masses, redness, or swelling Alignment: Symmetrical Functional ROM: Unrestricted ROM Stability: No instability detected Muscle Tone/Strength: Functionally intact. No obvious neuro-muscular anomalies detected. Sensory (Neurological): Unimpaired Muscle strength & Tone: No palpable anomalies  Lumbar Exam  Skin & Axial Inspection: No masses, redness, or swelling Alignment: Symmetrical Functional ROM: Pain restricted ROM affecting both sides Stability: No instability detected Muscle Tone/Strength: Functionally intact. No obvious neuro-muscular anomalies detected. Sensory (Neurological): Musculoskeletal pain pattern Palpation: No palpable anomalies       Provocative Tests: Hyperextension/rotation test: (+) bilaterally for facet joint pain. Lumbar quadrant test (Kemp's test): (+) bilaterally for facet joint pain. Lateral bending test: deferred today        Patrick's Maneuver: deferred today                   FABER* test: deferred today                   S-I anterior distraction/compression test: deferred today         S-I lateral compression test: deferred today         S-I Thigh-thrust test: deferred today         S-I Gaenslen's test: deferred today         *(Flexion, ABduction and External Rotation)  Gait & Posture Assessment  Ambulation: Limited Gait: Antalgic gait (limping) Posture: WNL   Lower Extremity Exam    Side: Right lower extremity  Side: Left lower extremity  Stability: No instability observed          Stability: No instability observed          Skin & Extremity Inspection: Skin color, temperature, and hair growth are WNL. No peripheral edema or cyanosis. No masses, redness, swelling, asymmetry,  or associated skin lesions. No contractures.  Skin & Extremity Inspection: Skin color, temperature, and hair growth are WNL. No peripheral edema or cyanosis. No masses, redness, swelling, asymmetry, or associated skin lesions. No contractures.  Functional ROM: Pain restricted ROM for hip and knee joints          Functional ROM: Pain restricted ROM for hip and knee joints          Muscle Tone/Strength: Functionally intact. No obvious neuro-muscular anomalies detected.  Muscle Tone/Strength: Functionally intact. No obvious neuro-muscular anomalies detected.  Sensory (Neurological): Musculoskeletal pain pattern        Sensory (Neurological): Musculoskeletal pain pattern        DTR: Patellar: deferred today Achilles: deferred today Plantar: deferred today  DTR: Patellar: deferred today Achilles: deferred today Plantar: deferred today  Palpation: No palpable anomalies  Palpation: No palpable anomalies   Assessment  Primary Diagnosis & Pertinent Problem List: The primary encounter diagnosis was Lumbar degenerative disc disease. Diagnoses of Lumbar facet arthropathy, Chronic bilateral low back pain without sciatica, Personality disorder  in adult [facility]), Bipolar disorder, current episode mixed, moderate ([place]), Panic disorder, and BMI 40.0-44.9, adult [facility]) were also pertinent to this visit.  Visit Diagnosis (New problems to examiner): 1. Lumbar degenerative disc disease   2. Lumbar facet arthropathy   3. Chronic bilateral low back pain without sciatica   4. 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 Bipolar disorder, current episode mixed, moderate ([place])   6. Panic disorder   7. BMI 40.0-44.9, adult [facility])    Plan of Care (Initial workup plan)  I had an extensive discussion with the patient regarding his treatment options.  I reviewed his lumbar MRI from 2019 with him.  This shows lumbar degenerative disc disease and mild facet arthropathy bilaterally at L3-L4, L4-L5, L5-S1.  Patient was offered diagnostic lumbar facet medial branch nerve blocks at L3, L4, L5 but declined stating that he is not interested in injection therapy and that steroids have caused adverse reactions.  I informed him that oral steroid medications can increase the risk of mania/manic episodes and that when depositing such medications to a focal region such as his lumbar spine, the risk of that is much less.  Patient states that he is here primarily for opioid management.  He states that all the other nonopioid-based medications that he has tried in the past were either not effective or resulted in various side effects.  I informed the patient that I do not recommend chronic opioid therapy for his condition for the following reasons: Morbid obesity with symptoms concerning for obstructive sleep apnea, currently on benzodiazepine: Xanax, significant psychiatric history including bipolar disorder, panic disorder for which she is on daily Xanax.  Patient did use profane language during the encounter while I was referencing the reasons why he would not be a candidate for chronic opioid therapy at this clinic.  I informed him that it is our clinic policy to not could  prescribe opioid analgesics to patients that have concomitant psychiatric disorder specifically bipolar disorder and are also on concurrent benzodiazepine therapy.  Combination benzodiazepine and opioid therapy can disproportionately increase the patient's risk of adverse respiratory effects especially in the context of morbid obesity which the patient has.  Patient left the clinic without being discharged by nursing staff.   Interventional management options: Mr. Thong was informed that there is no guarantee that he would be a candidate for interventional therapies. The decision will be based on the results of  diagnostic studies, as well as Mr. Spratlin risk profile.  Procedure(s) under consideration:  Lumbar facet medial branch nerve blocks   Provider-requested follow-up: No follow-ups on file.  No future appointments.  Note by: Gillis Santa, MD Date: 08/03/2020; Time: 3:16 PM

## 2020-08-04 DIAGNOSIS — F410 Panic disorder [episodic paroxysmal anxiety] without agoraphobia: Secondary | ICD-10-CM | POA: Diagnosis not present

## 2020-08-08 NOTE — Anesthesia Postprocedure Evaluation (Signed)
Anesthesia Post Note  Patient: Ronald Lucas  Procedure(s) Performed: COLONOSCOPY WITH PROPOFOL (N/A ) ESOPHAGOGASTRODUODENOSCOPY (EGD) WITH PROPOFOL (N/A )  Patient location during evaluation: PACU Anesthesia Type: General Level of consciousness: awake and alert Pain management: pain level controlled Vital Signs Assessment: post-procedure vital signs reviewed and stable Respiratory status: spontaneous breathing, nonlabored ventilation, respiratory function stable and patient connected to nasal cannula oxygen Cardiovascular status: blood pressure returned to baseline and stable Postop Assessment: no apparent nausea or vomiting Anesthetic complications: no   No complications documented.   Last Vitals:  Vitals:   07/21/20 0936 07/21/20 0946  BP: (!) 104/51 112/71  Pulse:    Resp:    Temp:    SpO2:      Last Pain:  Vitals:   07/21/20 0946  TempSrc:   PainSc: [place] Heath Badon

## 2020-10-02 ENCOUNTER — Other Ambulatory Visit: Payer: Self-pay | Admitting: Family Medicine

## 2020-10-02 DIAGNOSIS — I10 Essential (primary) hypertension: Secondary | ICD-10-CM

## 2020-10-03 ENCOUNTER — Other Ambulatory Visit: Payer: Self-pay

## 2020-10-03 NOTE — Telephone Encounter (Signed)
lvm to schedule an appt 

## 2020-10-04 ENCOUNTER — Other Ambulatory Visit: Payer: Self-pay

## 2020-10-04 ENCOUNTER — Telehealth: Payer: Self-pay

## 2020-10-04 DIAGNOSIS — I10 Essential (primary) hypertension: Secondary | ICD-10-CM

## 2020-10-04 MED ORDER — LISINOPRIL 5 MG PO TABS: 5.0000 mg | ORAL_TABLET | Freq: Every day | ORAL | 0 refills | Status: DC

## 2020-10-04 NOTE — Telephone Encounter (Signed)
Copied from CRM [id number]. Topic: General - Inquiry >> Oct 04, 2020 12:23 PM Adrian Prince D wrote: Reason for CRM: Patient called and made an appointment and would like medication to last until his appointment. He is now scheduled for 10-12-20 at 3:20. Please advise

## 2020-10-11 NOTE — Progress Notes (Signed)
Name: Ronald Lucas   MRN: [id number]    DOB: April 06, 1961   Date:10/12/2020       Progress Note  Subjective  Chief Complaint  Medication Refill  I connected with  Ronald Lucas on 10/12/20 at  3:20 PM EST by telephone and verified that I am speaking with the correct person using two identifiers.  I discussed the limitations, risks, security and privacy concerns of performing an evaluation and management service by telephone and the availability of in person appointments. Staff also discussed with the patient that there may be a patient responsible charge related to this service. Patient agreed on having a virtual visit  Patient Location: at home  Provider Location: [facility] Additional Individuals present: alone   HPI  HTN: bp is not being checked at home, he states only goes up when he is upset . He can only tolerate low dose of lisinopril otherwise he has severe muscle spasms   Dysphagia/GERD: he had EGD and colonoscopy done by Dr. Gus Puma 2021 and esophageal dilation, he states dysphagia has not changed,   Bipolar and panic disorder: seeing psychiatrist, Dr. Tamera Punt who monitors him,he is not on medications because of side effects only taking alprazolam bid . He states he is not doing well, the pain is causing him to " feel miserable"   New onset DM: his A1C went up to 7.2 % in July 2021, his visits have been virtual since, explained that he needs to come by asap for A1C and also bp check He has associated  obesity, dyslipidemia and HTN We gave him a prescription of Farxiga on his last visit but he never started medication.  He denies polyphagia, polydipsia or polyuria . Explained importance of yearly eye exam, urine micro, change in diet he was unable to tolerate statins and afraid to try anything else   COPD/Asthma: hestates he stopped Anoro because it made him feel anxious, discussed going back to Spiriva, but he states he prefers prn medication, he is now taking albuterol  but he also has combivent at home.  He refused COVID-19, Pneumonia and Flu vaccine. He has COVID-19 August 2020 - home test positive with symptoms   Chronic Back pain: went to pain clinicbut could not prescribe medication because he is on alprazolam. , he is in daily pain, but he states pain is getting worse and radiating down both legs he is willing to find another pain doctor. Pain today is 10/10, no bowel or bladder incontinence. Explained I can give him muscle relaxer but he states it does not help and causes upset stomach, he cannot tolerate Gabapentin or Lyrica.   Multiple environmental and food/drug allergies:we placed referral to Allergist back in 06/2020 , but appointment is not until March   Patient Active Problem List   Diagnosis Date Noted  . Lumbar facet arthropathy 08/03/2020  . Lumbar degenerative disc disease 08/03/2020  . ADD (attention deficit disorder) 03/17/2019  . 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 COPD with asthma ([place]) 05/09/2018  . PTSD (post-traumatic stress disorder) 05/09/2018  . Chronic bilateral low back pain without sciatica 07/19/2017  . Skin tag 07/19/2017  . Multiple allergies 07/19/2017  . Bipolar disorder, current episode mixed, moderate ([place]) 07/19/2017  . Panic disorder 07/19/2017  . BMI 40.0-44.9, adult ([place]) 07/19/2017  . Vascular calcification 07/19/2017  . Complex regional pain syndrome i of right lower limb 01/18/2016  . Dupuytren's contracture 01/18/2016  . Hypertension 05/28/2011    Past Surgical History:  Procedure Laterality Date  . CARDIAC SURGERY    . COLONOSCOPY WITH PROPOFOL N/A 07/21/2020   Procedure: COLONOSCOPY WITH PROPOFOL;  Surgeon: Jonathon Bellows, MD;  Location[facility] ENDOSCOPY;  Service: Gastroenterology;  Laterality: N/A;  . ESOPHAGOGASTRODUODENOSCOPY (EGD) WITH PROPOFOL N/A 07/21/2020   Procedure: ESOPHAGOGASTRODUODENOSCOPY (EGD) WITH PROPOFOL;  Surgeon: Jonathon Bellows, MD;  Location[facility] ENDOSCOPY;  Service:  Gastroenterology;  Laterality: N/A;  . FRACTURE SURGERY      Family History  Problem Relation Age of Onset  . Colon cancer Sister      Current Outpatient Medications:  .  ALPRAZolam (XANAX) 0.25 MG tablet, Take 0.25 mg by mouth 2 (two) times daily as needed for anxiety., Disp: , Rfl:  .  lisinopril (ZESTRIL) 5 MG tablet, Take 1 tablet (5 mg total) by mouth daily. Patient wants to stay on medication, Disp: 30 tablet, Rfl: 0 .  ALPRAZolam (XANAX) 0.5 MG tablet, Take 0.5 mg by mouth 2 (two) times daily., Disp: , Rfl:  .  dapagliflozin propanediol (FARXIGA) 10 MG TABS tablet, Take 1 tablet (10 mg total) by mouth daily before breakfast. (Patient not taking: No sig reported), Disp: 30 tablet, Rfl: 2 .  Ipratropium-Albuterol (COMBIVENT) 20-100 MCG/ACT AERS respimat, Inhale 1 puff into the lungs 4 (four) times daily as needed for wheezing. (Patient not taking: Reported on 10/12/2020), Disp: 4 g, Rfl: 2 .  mometasone (ELOCON) 0.1 % ointment, Apply topically daily. Apply to legs twice a day for 2 weeks, then decrease to 5 nights a week (Patient not taking: No sig reported), Disp: 45 g, Rfl: 1 .  omeprazole (PRILOSEC) 40 MG capsule, Take 1 capsule (40 mg total) by mouth daily. (Patient not taking: No sig reported), Disp: 90 capsule, Rfl: 1 .  rosuvastatin (CRESTOR) 5 MG tablet, Take 1 tablet (5 mg total) by mouth daily. (Patient not taking: No sig reported), Disp: 30 tablet, Rfl: 2  Allergies  Allergen Reactions  . Peanut Oil Anaphylaxis  . Peanut-Containing Drug Products Anaphylaxis  . Penicillins Hives    Other reaction(s): Anaphylaxis Throat swelling  . Tetanus Toxoids     Other reaction(s): Swelling  . Buspar [Buspirone] Other (See Comments)    Motion sickness  . Flexeril [Cyclobenzaprine] Other (See Comments), Nausea Only and Nausea And Vomiting    hallucinations  . Gabapentin Nausea And Vomiting  . Methocarbamol Nausea Only  . Morphine And Related Other (See Comments)    Gets violent if  takes Morphine   . Norvasc [Amlodipine Besylate]     Sob, wheezing   . Prednisone     Other reaction(s): Hallucination hallucinations hallucinations  . Xyzal [Levocetirizine]   . Lisinopril     Muscle cramp - able to tolerate lower dose 5 mg, does not want to change medication    I personally reviewed active problem list, medication list, allergies, family history, social history, health maintenance with the patient/caregiver today.   ROS  Ten systems reviewed and is negative except as mentioned in HPI   Objective  Virtual encounter, vitals not obtained.  There is no height or weight on file to calculate BMI.  Physical Exam  Awake, alert and oriented   PHQ2/9: Depression screen [facility] 2/9 08/03/2020 06/29/2020 04/05/2020 01/21/2020 06/11/2019  Decreased Interest 0 3 0 0 3  Down, Depressed, Hopeless 0 3 0 2 3  PHQ - 2 Score 0 6 0 2 6  Altered sleeping - 3 0 0 3  Tired, decreased energy - 3 0 0 3  Change in appetite -  3 0 0 3  Feeling bad or failure about yourself  - 3 0 0 3  Trouble concentrating - 3 0 0 3  Moving slowly or fidgety/restless - 3 0 0 0  Suicidal thoughts - 3 0 0 3  PHQ-9 Score - 27 0 2 24  Difficult doing work/chores - Extremely dIfficult - Somewhat difficult Extremely dIfficult   PHQ-2/9 Result is patient refused, seeing psychiatrist .    Fall Risk: Fall Risk  10/12/2020 08/03/2020 06/29/2020 04/05/2020 01/21/2020  Falls in the past year? 0 1 1 1  0  Comment - fell and broke his left big toe, did not seek medical atention, toenail fell off. - - -  Number falls in past yr: 0 1 1 1  0  Injury with Fall? 0 1 1 1  0  Follow up - - - - -     Assessment & Plan  1. Dyslipidemia associated with type 2 diabetes mellitus (HCC)  - dapagliflozin propanediol (FARXIGA) 10 MG TABS tablet; Take 1 tablet (10 mg total) by mouth daily before breakfast.  Dispense: 90 tablet; Refill: 0  2. COPD with asthma ([place])  Taking prn medication   3. Bipolar disorder, current episode  mixed, moderate ([place])  Under the care of psychiatrist  4. Morbid obesity ([place])  Discussed with the patient the risk posed by an increased BMI. Discussed importance of portion control, calorie counting and at least 150 minutes of physical activity weekly. Avoid sweet beverages and drink more water. Eat at least 6 servings of fruit and vegetables daily   5. Hypertension associated with diabetes ([place])  Unable to check bp  6. Essential hypertension  - lisinopril (ZESTRIL) 5 MG tablet; Take 1 tablet (5 mg total) by mouth daily. Patient wants to stay on medication  Dispense: 90 tablet; Refill: 0  7. Statin intolerance     I discussed the assessment and treatment plan with the patient. The patient was provided an opportunity to ask questions and all were answered. The patient agreed with the plan and demonstrated an understanding of the instructions.   The patient was advised to call back or seek an in-person evaluation if the symptoms worsen or if the condition fails to improve as anticipated.  I provided 25 minutes of non-face-to-face time during this encounter.  Loistine Chance, MD

## 2020-10-12 ENCOUNTER — Ambulatory Visit (INDEPENDENT_AMBULATORY_CARE_PROVIDER_SITE_OTHER): Payer: Medicaid Other | Admitting: Family Medicine

## 2020-10-12 ENCOUNTER — Encounter: Payer: Self-pay | Admitting: Family Medicine

## 2020-10-12 DIAGNOSIS — I152 Hypertension secondary to endocrine disorders: Secondary | ICD-10-CM

## 2020-10-12 DIAGNOSIS — E1159 Type 2 diabetes mellitus with other circulatory complications: Secondary | ICD-10-CM | POA: Diagnosis not present

## 2020-10-12 DIAGNOSIS — J449 Chronic obstructive pulmonary disease, unspecified: Secondary | ICD-10-CM

## 2020-10-12 DIAGNOSIS — E785 Hyperlipidemia, unspecified: Secondary | ICD-10-CM | POA: Diagnosis not present

## 2020-10-12 DIAGNOSIS — I10 Essential (primary) hypertension: Secondary | ICD-10-CM | POA: Diagnosis not present

## 2020-10-12 DIAGNOSIS — E1169 Type 2 diabetes mellitus with other specified complication: Secondary | ICD-10-CM | POA: Diagnosis not present

## 2020-10-12 DIAGNOSIS — F3162 Bipolar disorder, current episode mixed, moderate: Secondary | ICD-10-CM

## 2020-10-12 DIAGNOSIS — Z789 Other specified health status: Secondary | ICD-10-CM

## 2020-10-12 MED ORDER — DAPAGLIFLOZIN PROPANEDIOL 10 MG PO TABS: 10.0000 mg | ORAL_TABLET | Freq: Every day | ORAL | 0 refills | Status: DC

## 2020-10-12 MED ORDER — LISINOPRIL 5 MG PO TABS: 5.0000 mg | ORAL_TABLET | Freq: Every day | ORAL | 0 refills | Status: DC

## 2020-11-03 DIAGNOSIS — F410 Panic disorder [episodic paroxysmal anxiety] without agoraphobia: Secondary | ICD-10-CM | POA: Diagnosis not present

## 2020-12-08 ENCOUNTER — Telehealth: Payer: Self-pay | Admitting: Family Medicine

## 2020-12-08 NOTE — Telephone Encounter (Signed)
Pt states at his last visit he spoke to Dr Ancil Boozer about prescribing an albuterol inhaler and thought she was. Pt states this is what works for him.  Not the COMBIVENT.  Pt would like this sent into  [place], [place]

## 2020-12-09 ENCOUNTER — Other Ambulatory Visit: Payer: Self-pay | Admitting: Family Medicine

## 2020-12-09 DIAGNOSIS — J449 Chronic obstructive pulmonary disease, unspecified: Secondary | ICD-10-CM

## 2020-12-09 MED ORDER — IPRATROPIUM-ALBUTEROL 20-100 MCG/ACT IN AERS: 1.0000 | INHALATION_SPRAY | Freq: Four times a day (QID) | RESPIRATORY_TRACT | 2 refills | Status: DC | PRN

## 2020-12-12 ENCOUNTER — Other Ambulatory Visit: Payer: Self-pay | Admitting: Family Medicine

## 2020-12-12 DIAGNOSIS — J449 Chronic obstructive pulmonary disease, unspecified: Secondary | ICD-10-CM

## 2020-12-12 MED ORDER — ALBUTEROL SULFATE HFA 108 (90 BASE) MCG/ACT IN AERS: 2.0000 | INHALATION_SPRAY | Freq: Four times a day (QID) | RESPIRATORY_TRACT | 0 refills | Status: DC | PRN

## 2020-12-12 NOTE — Telephone Encounter (Signed)
Pt called and is requesting to speak with nurse. He states that the Combivent inhaler that was sent over for him does not work. He is requesting to have the albuterol inhaler instead. Please advise.       [place] [address], [place] - [place]  [place] [address]  Phone: [phone] Fax: [phone]  Hours: Not open 24 hours

## 2021-01-20 ENCOUNTER — Telehealth: Payer: Self-pay | Admitting: Family Medicine

## 2021-01-20 NOTE — Telephone Encounter (Signed)
Pt called stating that he is needing to have a refill on his BP medication, lisinopril. Pt states however that he would like to have PCP look into a different BP medication due to cramps that he is experiencing again. He states that they are all over his body and is requesting to have something new. Pt states that he completely out of his current BP medication. Please advise.     [place] [address] [place] - [place]  [place] [address]  Phone: [phone] Fax: [phone]  Hours: Not open 24 hours

## 2021-01-20 NOTE — Telephone Encounter (Signed)
Called pt to inform him that we needed to scheduled an in office appt for his med refill per our office policy. Pt has not been in office since 06/2020, 7 mths . Pt states he has had covid twice and does not want it again. I explained to him that we do not see sick people in office,  and that his appt this time needs to be in office. Pt states he wants me to ask if he can do virtual. Please advise.

## 2021-01-23 ENCOUNTER — Other Ambulatory Visit: Payer: Self-pay

## 2021-01-23 ENCOUNTER — Ambulatory Visit: Payer: Medicaid Other | Admitting: Family Medicine

## 2021-01-23 ENCOUNTER — Other Ambulatory Visit: Payer: Self-pay | Admitting: Family Medicine

## 2021-01-23 ENCOUNTER — Encounter: Payer: Self-pay | Admitting: Family Medicine

## 2021-01-23 VITALS — BP 148/88 | HR 90 | Temp 98.4°F | Resp 16 | Ht 72.0 in | Wt 310.0 lb

## 2021-01-23 DIAGNOSIS — F3162 Bipolar disorder, current episode mixed, moderate: Secondary | ICD-10-CM | POA: Diagnosis not present

## 2021-01-23 DIAGNOSIS — E1169 Type 2 diabetes mellitus with other specified complication: Secondary | ICD-10-CM | POA: Diagnosis not present

## 2021-01-23 DIAGNOSIS — E1159 Type 2 diabetes mellitus with other circulatory complications: Secondary | ICD-10-CM | POA: Diagnosis not present

## 2021-01-23 DIAGNOSIS — E785 Hyperlipidemia, unspecified: Secondary | ICD-10-CM

## 2021-01-23 DIAGNOSIS — Z6841 Body Mass Index (BMI) 40.0 and over, adult: Secondary | ICD-10-CM

## 2021-01-23 DIAGNOSIS — G8929 Other chronic pain: Secondary | ICD-10-CM

## 2021-01-23 DIAGNOSIS — M5441 Lumbago with sciatica, right side: Secondary | ICD-10-CM

## 2021-01-23 DIAGNOSIS — M5442 Lumbago with sciatica, left side: Secondary | ICD-10-CM

## 2021-01-23 DIAGNOSIS — R1319 Other dysphagia: Secondary | ICD-10-CM

## 2021-01-23 DIAGNOSIS — I10 Essential (primary) hypertension: Secondary | ICD-10-CM

## 2021-01-23 DIAGNOSIS — J449 Chronic obstructive pulmonary disease, unspecified: Secondary | ICD-10-CM

## 2021-01-23 DIAGNOSIS — Z789 Other specified health status: Secondary | ICD-10-CM | POA: Diagnosis not present

## 2021-01-23 DIAGNOSIS — I152 Hypertension secondary to endocrine disorders: Secondary | ICD-10-CM | POA: Diagnosis not present

## 2021-01-23 LAB — POCT GLYCOSYLATED HEMOGLOBIN (HGB A1C): Hemoglobin A1C: 8 % — AB (ref 4.0–5.6)

## 2021-01-23 MED ORDER — LISINOPRIL 5 MG PO TABS: 5.0000 mg | ORAL_TABLET | Freq: Every day | ORAL | 1 refills | Status: DC

## 2021-01-23 MED ORDER — OLMESARTAN MEDOXOMIL 5 MG PO TABS: 5.0000 mg | ORAL_TABLET | Freq: Every day | ORAL | 1 refills | Status: DC

## 2021-01-23 MED ORDER — DAPAGLIFLOZIN PROPANEDIOL 10 MG PO TABS: 10.0000 mg | ORAL_TABLET | Freq: Every day | ORAL | 0 refills | Status: DC

## 2021-01-23 MED ORDER — TIZANIDINE HCL 2 MG PO CAPS: 2.0000 mg | ORAL_CAPSULE | Freq: Three times a day (TID) | ORAL | 0 refills | Status: DC | PRN

## 2021-01-23 NOTE — Telephone Encounter (Signed)
Requested medication (s) are due for refill today: yes  Requested medication (s) are on the active medication list: yes  Last refill:  01/23/21 #90 0 refills   Future visit scheduled: yes , seen today   Notes to clinic:  not delegated per protocol, pharmacy reports tablets covered by mnc not capsules as ordered.     Requested Prescriptions  Pending Prescriptions Disp Refills   tizanidine (ZANAFLEX) 2 MG capsule [Pharmacy Med Name: TIZANIDINE 2MG       CAP] 90 capsule 0    Sig: TAKE 1 CAPSULE BY MOUTH THREE TIMES DAILY AS NEEDED FOR MUSCLE SPASM      Not Delegated - Cardiovascular:  Alpha-2 Agonists - tizanidine Failed - 01/23/2021  4:26 PM      Failed - This refill cannot be delegated      Passed - Valid encounter within last 6 months    Recent Outpatient Visits           Today Dyslipidemia associated with type 2 diabetes mellitus [facility])   [facility] Steele Sizer, MD   3 months ago Dyslipidemia associated with type 2 diabetes mellitus [facility])   [facility] Steele Sizer, MD   6 months ago Esophageal dysphagia   [facility] Steele Sizer, MD   9 months ago Esophageal dysphagia   [facility] Steele Sizer, MD   1 year ago Skin lesion of back   [facility] Delsa Grana, [place]       Future Appointments             In 3 months Steele Sizer, MD [facility], [facility]

## 2021-01-23 NOTE — Progress Notes (Signed)
Name: Ronald Lucas   MRN: [id number]    DOB: 06/22/1961   Date:01/23/2021       Progress Note  Subjective  Chief Complaint  Medication Refill  HPI  HTN: bp is not being checked at home, he states only goes up when he is upset . He can only tolerate low dose of lisinopril otherwise he has severe muscle spasm, but states now also having problems with lisinopril he agreed on trying benicar 5 mg and monitor. No chest pain, palpitation    Dysphagia/GERD: he had EGD and colonoscopy done by Dr. Gus Puma 2021 and esophageal dilation, he states dysphagia has not changed, he stopped PPI and states doing well   Bipolar and panic disorder: seeing psychiatrist, Dr. Tamera Punt who monitors him,he is not on medications because of side effects only taking alprazolam bid . He states he is not doing well, the pain is causing him to " feel miserable" He is also having problems with his neighbors   DM in obese:  his A1C went up to 7.2 % in July 2021, today it is 8%  He has associated  obesity, dyslipidemia and HTN We gave him a prescription of Farxiga on his last visit but he never started medication, discussed GLP-1 agonist, he wants an oral medication, discussed possible side effects and he decided on Farxiga again   .  He denies polyphagia, polydipsia or polyuria . Explained importance of yearly eye exam, urine micro, change in diet he was unable to tolerate statins and afraid to try anything else . He states he is allergic to too many medications He refused pneumonia vaccine   COPD/Asthma: hestates he stopped Anoro because it made him feel anxious, discussed going back to Spiriva, but he states he prefers prn medication, he is now taking albuterol prn and states needs to take Benadryl daily to control allergy symptoms.   He refused COVID-19, Pneumonia and Flu vaccine.   Chronic Back pain: went to pain clinicbut could not prescribe medication because he is on alprazolam. , he is in daily pain, but he  states pain is getting worse and radiating down both legs he is willing to find another pain doctor. Pain today is 10/10, no bowel or bladder incontinence. Explained I can give him muscle relaxer but he states it does not help and causes upset stomach, he cannot tolerate Gabapentin or Lyrica. He states Flexeril did not work, but willing to try Tizanidine   Multiple environmental and food/drug allergies:we placed referral to Allergist back in 06/2020 , he was unable to stop benadryl and not able to have allergy testing done   Patient Active Problem List   Diagnosis Date Noted  . Lumbar facet arthropathy 08/03/2020  . Lumbar degenerative disc disease 08/03/2020  . ADD (attention deficit disorder) 03/17/2019  . 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 COPD with asthma ([place]) 05/09/2018  . PTSD (post-traumatic stress disorder) 05/09/2018  . Chronic bilateral low back pain without sciatica 07/19/2017  . Skin tag 07/19/2017  . Multiple allergies 07/19/2017  . Bipolar disorder, current episode mixed, moderate ([place]) 07/19/2017  . Panic disorder 07/19/2017  . BMI 40.0-44.9, adult ([place]) 07/19/2017  . Vascular calcification 07/19/2017  . Complex regional pain syndrome i of right lower limb 01/18/2016  . Dupuytren's contracture 01/18/2016  . Hypertension 05/28/2011    Past Surgical History:  Procedure Laterality Date  . CARDIAC SURGERY    . COLONOSCOPY WITH PROPOFOL N/A 07/21/2020   Procedure: COLONOSCOPY WITH PROPOFOL;  Surgeon: Jonathon Bellows, MD;  Location: [facility] ENDOSCOPY;  Service: Gastroenterology;  Laterality: N/A;  . ESOPHAGOGASTRODUODENOSCOPY (EGD) WITH PROPOFOL N/A 07/21/2020   Procedure: ESOPHAGOGASTRODUODENOSCOPY (EGD) WITH PROPOFOL;  Surgeon: Jonathon Bellows, MD;  Location: [facility] ENDOSCOPY;  Service: Gastroenterology;  Laterality: N/A;  . FRACTURE SURGERY      Family History  Problem Relation Age of Onset  . Colon cancer Sister     Social History   Tobacco Use  . Smoking  status: Current Every Day Smoker    Packs/day: 0.10    Types: Cigarettes    Start date: 03/17/1983  . Smokeless tobacco: Never Used  Substance Use Topics  . Alcohol use: No     Current Outpatient Medications:  .  albuterol (VENTOLIN HFA) 108 (90 Base) MCG/ACT inhaler, Inhale 2 puffs into the lungs every 6 (six) hours as needed for wheezing or shortness of breath., Disp: 18 g, Rfl: 0 .  ALPRAZolam (XANAX) 0.25 MG tablet, Take 0.25 mg by mouth 2 (two) times daily as needed for anxiety., Disp: , Rfl:  .  dapagliflozin propanediol (FARXIGA) 10 MG TABS tablet, Take 1 tablet (10 mg total) by mouth daily before breakfast., Disp: 90 tablet, Rfl: 0 .  olmesartan (BENICAR) 5 MG tablet, Take 1 tablet (5 mg total) by mouth daily. In place of lisinopril, Disp: 90 tablet, Rfl: 1 .  tizanidine (ZANAFLEX) 2 MG capsule, Take 1 capsule (2 mg total) by mouth 3 (three) times daily as needed for muscle spasms., Disp: 90 capsule, Rfl: 0  Allergies  Allergen Reactions  . Peanut Oil Anaphylaxis  . Peanut-Containing Drug Products Anaphylaxis  . Penicillins Hives    Other reaction(s): Anaphylaxis Throat swelling  . Tetanus Toxoids     Other reaction(s): Swelling  . Buspar [Buspirone] Other (See Comments)    Motion sickness  . Flexeril [Cyclobenzaprine] Other (See Comments), Nausea Only and Nausea And Vomiting    hallucinations  . Gabapentin Nausea And Vomiting  . Methocarbamol Nausea Only  . Morphine And Related Other (See Comments)    Gets violent if takes Morphine   . Norvasc [Amlodipine Besylate]     Sob, wheezing   . Prednisone     Other reaction(s): Hallucination hallucinations hallucinations  . Xyzal [Levocetirizine]     Dry eyes    I personally reviewed active problem list, medication list, allergies, family history, social history, health maintenance with the patient/caregiver today.   ROS  Constitutional: Negative for fever or weight change.  Respiratory: Negative for cough but has  intermittent  shortness of breath.   Cardiovascular: Negative for chest pain or palpitations.  Gastrointestinal: Negative for abdominal pain, no bowel changes.  Musculoskeletal: positive for gait problem but no  joint swelling.  Skin: Negative for rash.  Neurological: Negative for dizziness or headache.  No other specific complaints in a complete review of systems (except as listed in HPI above).  Objective  Vitals:   01/23/21 1451  BP: (!) 148/88  Pulse: 90  Resp: 16  Temp: 98.4 F (36.9 C)  TempSrc: Oral  SpO2: 96%  Weight: (!) 310 lb (140.6 kg)  Height: 6' (1.829 m)    Body mass index is 42.04 kg/m.  Physical Exam  Constitutional: Patient appears well-developed and well-nourished. Obese  No distress.  HEENT: head atraumatic, normocephalic, pupils equal and reactive to light,  neck supple Cardiovascular: Normal rate, regular rhythm and normal heart sounds.  No murmur heard. No BLE edema. Pulmonary/Chest: Effort normal and breath sounds normal. No respiratory distress. Abdominal:  Soft.  There is no tenderness. Muscular Skeletal: patient leaning forward on counter top, moving around in pain , tender lower back with palpation  Psychiatric: Patient has a normal mood and affect. behavior is normal. Judgment and thought content normal.   Diabetic Foot Exam: Diabetic Foot Exam - Simple   Simple Foot Form Diabetic Foot exam was performed with the following findings: Yes 01/23/2021  4:03 PM  Visual Inspection See comments: Yes Sensation Testing Intact to touch and monofilament testing bilaterally: Yes Pulse Check Posterior Tibialis and Dorsalis pulse intact bilaterally: Yes Comments Callus formation       PHQ2/9: Depression screen [facility] 2/9 01/23/2021 08/03/2020 06/29/2020 04/05/2020 01/21/2020  Decreased Interest 3 0 3 0 0  Down, Depressed, Hopeless 3 0 3 0 2  PHQ - 2 Score 6 0 6 0 2  Altered sleeping 3 - 3 0 0  Tired, decreased energy 3 - 3 0 0  Change in appetite 0 - 3  0 0  Feeling bad or failure about yourself  3 - 3 0 0  Trouble concentrating 0 - 3 0 0  Moving slowly or fidgety/restless 0 - 3 0 0  Suicidal thoughts 0 - 3 0 0  PHQ-9 Score 15 - 27 0 2  Difficult doing work/chores - - Extremely dIfficult - Somewhat difficult    phq 9 is positive   Fall Risk: Fall Risk  01/23/2021 10/12/2020 08/03/2020 06/29/2020 04/05/2020  Falls in the past year? 1 0 1 1 1   Comment - - fell and broke his left big toe, did not seek medical atention, toenail fell off. - -  Number falls in past yr: 1 0 1 1 1   Injury with Fall? 1 0 1 1 1   Follow up - - - - -     Functional Status Survey: Is the patient deaf or have difficulty hearing?: No Does the patient have difficulty seeing, even when wearing glasses/contacts?: No Does the patient have difficulty concentrating, remembering, or making decisions?: Yes Does the patient have difficulty walking or climbing stairs?: Yes Does the patient have difficulty dressing or bathing?: Yes Does the patient have difficulty doing errands alone such as visiting a doctor's office or shopping?: No   Assessment & Plan  1. Dyslipidemia associated with type 2 diabetes mellitus (HCC)  - olmesartan (BENICAR) 5 MG tablet; Take 1 tablet (5 mg total) by mouth daily. In place of lisinopril  Dispense: 90 tablet; Refill: 1 - POCT HgB A1C - dapagliflozin propanediol (FARXIGA) 10 MG TABS tablet; Take 1 tablet (10 mg total) by mouth daily before breakfast.  Dispense: 90 tablet; Refill: 0  2. Bipolar disorder, current episode mixed, moderate ([place])  Keep follow up with psychiatrist   3. Essential hypertension  - olmesartan (BENICAR) 5 MG tablet; Take 1 tablet (5 mg total) by mouth daily. In place of lisinopril  Dispense: 90 tablet; Refill: 1  4. Hypertension associated with diabetes ([place])  Changing from ACE to ARB  5. Morbid obesity ([place])  Discussed GLP 1 agonist but he refused   6. Statin intolerance   7. Esophageal  dysphagia  stable  8. Chronic bilateral low back pain with bilateral sciatica  - tizanidine (ZANAFLEX) 2 MG capsule; Take 1 capsule (2 mg total) by mouth 3 (three) times daily as needed for muscle spasms.  Dispense: 90 capsule; Refill: 0  9. COPD with asthma ([place])  Refuses maintenance medication

## 2021-01-23 NOTE — Telephone Encounter (Signed)
lvm for pt to do a in office appt per dr Ancil Boozer

## 2021-01-23 NOTE — Telephone Encounter (Signed)
Pt was seen today.

## 2021-01-25 ENCOUNTER — Telehealth: Payer: Self-pay | Admitting: Family Medicine

## 2021-01-25 ENCOUNTER — Other Ambulatory Visit: Payer: Self-pay | Admitting: Family Medicine

## 2021-01-25 MED ORDER — SITAGLIPTIN PHOSPHATE 100 MG PO TABS: 100.0000 mg | ORAL_TABLET | Freq: Every day | ORAL | 0 refills | Status: DC

## 2021-01-25 NOTE — Telephone Encounter (Signed)
Patient would like to be contacted regarding a medication reaction to dapagliflozin propanediol (FARXIGA) 10 MG TABS tablet   Patient has experienced itching, difficulty breathing and a swelling sensation in their eyes for the past 36 hours   Patient expressly declined to speak with a triage nurse  Please contact to further advise when possible

## 2021-01-26 ENCOUNTER — Telehealth: Payer: Self-pay | Admitting: Family Medicine

## 2021-01-26 ENCOUNTER — Other Ambulatory Visit: Payer: Self-pay

## 2021-01-26 ENCOUNTER — Ambulatory Visit: Payer: Self-pay | Admitting: *Deleted

## 2021-01-26 DIAGNOSIS — G8929 Other chronic pain: Secondary | ICD-10-CM

## 2021-01-26 DIAGNOSIS — M5442 Lumbago with sciatica, left side: Secondary | ICD-10-CM

## 2021-01-26 MED ORDER — TIZANIDINE HCL 2 MG PO TABS: 2.0000 mg | ORAL_TABLET | Freq: Four times a day (QID) | ORAL | 0 refills | Status: DC | PRN

## 2021-01-26 NOTE — Telephone Encounter (Signed)
Spoke to patient and he was in agreement and verbalized understanding with no questions or concerns at this time.

## 2021-01-26 NOTE — Telephone Encounter (Signed)
Pt called stating he is having discomfort between his anus and penis  (testicle area) that started  AM 01/26/21; the pt says it burns when he tries to urinate; he feels like it is related to his diabetes medication; he denies fever; the pt also says ongoing back pain; he has intermittent nausea; he took a dose of Farxiga 01/24/21; he says his stomach feels bad and he has acid reflux; he is approaching the worst he is ever felt; recommendations made per nurse triage protocol; he verbalized understanding and says he does not want to go to the ED; the pt is seen by Dr Ancil Boozer at [facility]; pt transferred to [facility] for final disposition.  Reason for Disposition . SEVERE pain (e.g., excruciating)  Answer Assessment - Initial Assessment Questions 1. LOCATION and RADIATION: "Where is the pain located?"      Between anus and scrotum 2. QUALITY: "What does the pain feel like?"  (e.g., sharp, dull, aching, burning)   Stabbing; ripping 3. SEVERITY: "How bad is the pain?"  (Scale 1-10; or mild, moderate, severe)   - MILD (1-3): doesn't interfere with normal activities    - MODERATE (4-7): interferes with normal activities (e.g., work or school) or awakens from sleep   - SEVERE (8-10): excruciating pain, unable to do any normal activities, difficulty walking     severe 4. ONSET: "When did the pain start?"     AM 01/26/21 5. PATTERN: "Does it come and go, or has it been constant since it started?"     Intermittent; occurs when standing up 6. SCROTAL APPEARANCE: "What does the scrotum look like?" "Is there any swelling or redness?"     Normal 7. HERNIA: "Has a doctor ever told you that you have a hernia?"    no 8. OTHER SYMPTOMS: "Do you have any other symptoms?" (e.g., fever, abdominal pain, vomiting, difficulty passing urine)    Nausea, back pain, burning with urination  Protocols used: SCROTAL PAIN-A-AH

## 2021-01-26 NOTE — Telephone Encounter (Signed)
Pt called reporting that there is a Prior authorization needed for sitaGLIPtin (JANUVIA) 100 MG tablet  At the pharmacy on file, pt states that cannot afford a Rx for $600.

## 2021-01-27 NOTE — Telephone Encounter (Signed)
Pt told this yesterday and was told to go to urgent care or ER, spoke to him personally

## 2021-01-27 NOTE — Telephone Encounter (Signed)
PA was initiated yesterday morning, awaiting insurance decision. Will reach out to patient once determination has been obtained.

## 2021-03-01 DIAGNOSIS — F410 Panic disorder [episodic paroxysmal anxiety] without agoraphobia: Secondary | ICD-10-CM | POA: Diagnosis not present

## 2021-03-07 ENCOUNTER — Ambulatory Visit: Payer: Self-pay | Admitting: *Deleted

## 2021-03-07 NOTE — Telephone Encounter (Signed)
Pt called in c/o swelling under his right jaw going into his right ear canal, chest tightness and tingling in his throat after taking a new BP medication olmesartan  5 mg a few minutes ago.  He was switched from lisinopril to olmesartan due to "muscle spasms all over his body".   "This medicine is doing the same thing".    Instructed to call 911 now.   At first he did not want to.   I had to really encourage and emphasize the importance of going to the ED.  He also has a history of anaphylactic reactions.  His mother is there with him so he is not alone.  He agreed to call 911.   I sent my notes to [facility]   Reason for Disposition . [1] Life-threatening reaction (anaphylaxis) in the past to similar substance (e.g., food, insect bite/sting, chemical, etc.) AND [2] < 2 hours since exposure    Just took Olmesartan and having chest tightness, tingling in throat and swelling under right jaw into right ear canal.   Has had anaphylactic reactions before.  Answer Assessment - Initial Assessment Questions 1. ONSET: "When did the swelling start?" (e.g., minutes, hours, days)     Has swelling in right jaw.   I'm having a problem with muscle spasms all over my body.   This new medicine is messing with me.  A BP medicine she prescribed.    2. LOCATION: "What part of the face is swollen?"     The outside of my bottom of right jaw is swollen.   As soon as I took this medicine.  The swelling started.   3. SEVERITY: "How swollen is it?"     My right jaw is swollen under my jaw and I'm feeling tight in my chest.   I'm having muscle spasms all over my body that is jumping around.   4. ITCHING: "Is there any itching?" If Yes, ask: "How much?"   (Scale 1-10; mild, moderate or severe)     I'm having tingling in my throat.  My right ear canal is swollen too.   5. PAIN: "Is the swelling painful to touch?" If Yes, ask: "How painful is it?"   (Scale 1-10; mild, moderate or severe)   - NONE (0): no  pain   - MILD (1-3): doesn't interfere with normal activities    - MODERATE (4-7): interferes with normal activities or awakens from sleep    - SEVERE (8-10): excruciating pain, unable to do any normal activities      Muscle spasms jumping all over my body. 6. FEVER: "Do you have a fever?" If Yes, ask: "What is it, how was it measured, and when did it start?"      No 7. CAUSE: "What do you think is causing the face swelling?"     This new BP medicine she prescribed for me.  Olmesartan.   I just took it a few minutes ago and this swelling under my right jaw, chest tightness and tingling in my throat started along with my right ear canal feeling swollen too. 8. RECURRENT SYMPTOM: "Have you had face swelling before?" If Yes, ask: "When was the last time?" "What happened that time?"     "I've had anaphylactic reactions before to stuff".   "I really don't want to go to the ED" 9. OTHER SYMPTOMS: "Do you have any other symptoms?" (e.g., toothache, leg swelling)     Just muscle spasms jumping all over my  body.   She changed my BP medication because the other BP medication she put me on caused muscle spasms all over my body.   Why did she prescribe this know ing it had the same side effects?    I don't understand?  "This is crazy"! 10. PREGNANCY: "Is there any chance you are pregnant?" "When was your last menstrual period?"       N/A  Protocols used: [facility]

## 2021-03-08 ENCOUNTER — Ambulatory Visit: Payer: Self-pay | Admitting: *Deleted

## 2021-03-08 NOTE — Telephone Encounter (Signed)
Called patient states had reaction to Benicar (see nurse call) has several reactions to several meds.  He is now concerned that he is not on anything for BP.  Wants to be put on something else? Or restart Lisinopril which he was having a reaction to muscle pain.  Please advise?

## 2021-03-08 NOTE — Telephone Encounter (Signed)
  Reason for Disposition . [1] Caller has URGENT medicine question about med that PCP or specialist prescribed AND [2] triager unable to answer question  Protocols used: MEDICATION QUESTION CALL-A-AH

## 2021-03-08 NOTE — Telephone Encounter (Signed)
No triaged performed due to patient only requesting a different B/P medication due to the side effects experienced from the Benicar 5 mg tabs. These side effects occurred last week, none now except left over muscle soreness.Denies SOB/fever. No B/P to report today. See triage note from yesterday that was sent to office.  Reported he went back to taking Lisinopril 10 mg (broken in half) He is now out of Lisinopril.  He is request antihypertensive that does not list muscle cramps as a SE.

## 2021-03-10 NOTE — Telephone Encounter (Signed)
Left vm

## 2021-04-04 ENCOUNTER — Telehealth: Payer: Self-pay | Admitting: Family Medicine

## 2021-04-04 NOTE — Telephone Encounter (Signed)
Upcoming appt 7.26.2022 Last seen 4.25.2022

## 2021-04-04 NOTE — Telephone Encounter (Signed)
Medication Refill - Medication: Lisinopril   Has the patient contacted their pharmacy? No. Pt states that the pharmacy told him to contact PCP. Please advise.  (Agent: If no, request that the patient contact the pharmacy for the refill.) (Agent: If yes, when and what did the pharmacy advise?)  Preferred Pharmacy (with phone number or street name):  [place] [place] [facility] [address] [place] [address]  Phone: [phone] Fax: [phone]  Hours: Not open 24 hours    Agent: Please be advised that RX refills may take up to 3 business days. We ask that you follow-up with your pharmacy.

## 2021-04-05 ENCOUNTER — Other Ambulatory Visit: Payer: Self-pay | Admitting: Family Medicine

## 2021-04-05 NOTE — Telephone Encounter (Signed)
Had reaction to benicar and wants to be placed back on lisinopril

## 2021-04-06 ENCOUNTER — Other Ambulatory Visit: Payer: Self-pay | Admitting: Family Medicine

## 2021-04-06 MED ORDER — LISINOPRIL 5 MG PO TABS
5.0000 mg | ORAL_TABLET | Freq: Two times a day (BID) | ORAL | 0 refills | Status: DC
Start: 2021-04-06 — End: 2021-08-03

## 2021-04-25 ENCOUNTER — Ambulatory Visit: Payer: Medicaid Other | Admitting: Family Medicine

## 2021-06-12 ENCOUNTER — Telehealth (INDEPENDENT_AMBULATORY_CARE_PROVIDER_SITE_OTHER): Payer: Medicaid Other | Admitting: Family Medicine

## 2021-06-12 ENCOUNTER — Encounter: Payer: Self-pay | Admitting: Family Medicine

## 2021-06-12 ENCOUNTER — Ambulatory Visit: Payer: Self-pay

## 2021-06-12 DIAGNOSIS — J0100 Acute maxillary sinusitis, unspecified: Secondary | ICD-10-CM | POA: Diagnosis not present

## 2021-06-12 MED ORDER — CLINDAMYCIN HCL 300 MG PO CAPS: 300.0000 mg | ORAL_CAPSULE | Freq: Two times a day (BID) | ORAL | 0 refills | Status: DC

## 2021-06-12 NOTE — Progress Notes (Signed)
Name: Ronald Lucas   MRN: [id number]    DOB: Sep 16, 1961   Date:06/12/2021       Progress Note  Subjective  Chief Complaint  Sinus Infection  I connected with  [name]  on 06/12/21 at 11:40 AM EDT by a video enabled telemedicine application and verified that I am speaking with the correct person using two identifiers.  I discussed the limitations of evaluation and management by telemedicine and the availability of in person appointments. The patient expressed understanding and agreed to proceed with the virtual visit  Staff also discussed with the patient that there may be a patient responsible charge related to this service. Patient Location: at home  Provider Location: [facility] Additional Individuals present: alone   HPI  Sinusitis: symptoms going on for a while, but worse over the past 5 days, he states initially just pressure and sinus congestion , inability to blow nose from right nostril. He has been using nasal spray and started to take some old clindamycin he had at home. His cheek is tender to touch and has been swollen , but skin is not red or warm to touch . No fever or chills. Denies toothache.   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 [facility]) 03/17/2019   COPD with asthma ([place]) 05/09/2018   PTSD (post-traumatic stress disorder) 05/09/2018   Chronic bilateral low back pain without sciatica 07/19/2017   Skin tag 07/19/2017   Multiple allergies 07/19/2017   Bipolar disorder, current episode mixed, moderate ([place]) 07/19/2017   Panic disorder 07/19/2017   BMI 40.0-44.9, adult ([place]) 07/19/2017   Vascular calcification 07/19/2017   Complex regional pain syndrome i of right lower limb 01/18/2016   Dupuytren's contracture 01/18/2016   Hypertension 05/28/2011    Past Surgical History:  Procedure  Laterality Date   CARDIAC SURGERY     COLONOSCOPY WITH PROPOFOL N/A 07/21/2020   Procedure: COLONOSCOPY WITH PROPOFOL;  Surgeon: Jonathon Bellows, MD;  Location: [facility] ENDOSCOPY;  Service: Gastroenterology;  Laterality: N/A;   ESOPHAGOGASTRODUODENOSCOPY (EGD) WITH PROPOFOL N/A 07/21/2020   Procedure: ESOPHAGOGASTRODUODENOSCOPY (EGD) WITH PROPOFOL;  Surgeon: Jonathon Bellows, MD;  Location[facility] ENDOSCOPY;  Service: Gastroenterology;  Laterality: N/A;   FRACTURE SURGERY      Family History  Problem Relation Age of Onset   Colon cancer Sister       Current Outpatient Medications:    albuterol (VENTOLIN HFA) 108 (90 Base) MCG/ACT inhaler, Inhale 2 puffs into the lungs every 6 (six) hours as needed for wheezing or shortness of breath., Disp: 18 g, Rfl: 0   ALPRAZolam (XANAX) 0.25 MG tablet, Take 0.25 mg by mouth 2 (two) times daily as needed for anxiety., Disp: , Rfl:    lisinopril (ZESTRIL) 5 MG tablet, Take 1 tablet (5 mg total) by mouth 2 (two) times daily., Disp: 60 tablet, Rfl: 0   sitaGLIPtin (JANUVIA) 100 MG tablet, Take 1 tablet (100 mg total) by mouth daily., Disp: 30 tablet, Rfl: 0   tiZANidine (ZANAFLEX) 2 MG tablet, Take 1 tablet (2 mg total) by mouth every 6 (six) hours as needed for muscle spasms., Disp: 90 tablet, Rfl: 0  Allergies  Allergen Reactions   Peanut Oil Anaphylaxis   Peanut-Containing Drug Products Anaphylaxis   Penicillins Hives    Other reaction(s): Anaphylaxis Throat swelling   Tetanus Toxoids  Other reaction(s): Swelling   Buspar [Buspirone] Other (See Comments)    Motion sickness   Farxiga [Dapagliflozin] Itching   Flexeril [Cyclobenzaprine] Other (See Comments), Nausea Only and Nausea And Vomiting    hallucinations   Gabapentin Nausea And Vomiting   Methocarbamol Nausea Only   Morphine And Related Other (See Comments)    Gets violent if takes Morphine    Norvasc [Amlodipine Besylate]     Sob, wheezing    Prednisone     Other reaction(s):  Hallucination hallucinations hallucinations   Xyzal [Levocetirizine]     Dry eyes   Lisinopril     Muscle cramp - able to tolerate lower dose 5 mg, does not want to change medication    I personally reviewed active problem list, medication list, allergies, family history, social history, health maintenance with the patient/caregiver today.   ROS  Ten systems reviewed and is negative except as mentioned in HPI   Objective  Virtual encounter, vitals not obtained.  There is no height or weight on file to calculate BMI.  Physical Exam  Awake, alert and oriented He has some swelling of right side of face, normal external eye movement , he tapped on his teeth and no pain. Pain during palpation of face, but no increase in redness  Fall Risk: Fall Risk  06/12/2021 01/23/2021 10/12/2020 08/03/2020 06/29/2020  Falls in the past year? 0 1 0 1 1  Comment - - - fell and broke his left big toe, did not seek medical atention, toenail fell off. -  Number falls in past yr: 0 1 0 1 1  Injury with Fall? 0 1 0 1 1  Risk for fall due to : No Fall Risks - - - -  Follow up Falls prevention discussed - - - -     Assessment & Plan  1. Subacute maxillary sinusitis  - clindamycin (CLEOCIN) 300 MG capsule; Take 1 capsule (300 mg total) by mouth in the morning and at bedtime.  Dispense: 20 capsule; Refill: 0   Discussed risk of facial cellulitis and if symptoms gets worse must go to [facility]. Discussed referral to ENT if improvement but no resolution  Discussed Avelox or Levaquin since he has been taking some old clindamycin about once a day for the past few days and it may cause antibiotic resistance, he states he is allergic to multiple medications and can only take Clindamycin twice daily for this symptoms.  He is allergic to Penicillin    I discussed the assessment and treatment plan with the patient. The patient was provided an opportunity to ask questions and all were answered. The patient agreed  with the plan and demonstrated an understanding of the instructions.  The patient was advised to call back or seek an in-person evaluation if the symptoms worsen or if the condition fails to improve as anticipated.  I provided 15  minutes of non-face-to-face time during this encounter.

## 2021-06-12 NOTE — Telephone Encounter (Signed)
Pt has virtual appt scheduled with Dr Ancil Boozer for today

## 2021-06-12 NOTE — Telephone Encounter (Signed)
Pt. Started having sinus pain and pressure 4 days ago. Has swelling around eyes today. Nose is blocked, no drainage. Virtual appointment today per Cassandra in the practice.    Reason for Disposition  [1] Sinus congestion (pressure, fullness) AND [2] present > 10 days  Answer Assessment - Initial Assessment Questions 1. LOCATION: "Where does it hurt?"      Eyes 2. ONSET: "When did the sinus pain start?"  (e.g., hours, days)      4 days ago 3. SEVERITY: "How bad is the pain?"   (Scale 1-10; mild, moderate or severe)   - MILD (1-3): doesn't interfere with normal activities    - MODERATE (4-7): interferes with normal activities (e.g., work or school) or awakens from sleep   - SEVERE (8-10): excruciating pain and patient unable to do any normal activities        Moderate 4. RECURRENT SYMPTOM: "Have you ever had sinus problems before?" If Yes, ask: "When was the last time?" and "What happened that time?"      Yes 5. NASAL CONGESTION: "Is the nose blocked?" If Yes, ask: "Can you open it or must you breathe through your mouth?"     Yes 6. NASAL DISCHARGE: "Do you have discharge from your nose?" If so ask, "What color?"     No discharge 7. FEVER: "Do you have a fever?" If Yes, ask: "What is it, how was it measured, and when did it start?"      Chills 8. OTHER SYMPTOMS: "Do you have any other symptoms?" (e.g., sore throat, cough, earache, difficulty breathing)     Swelling to face 9. PREGNANCY: "Is there any chance you are pregnant?" "When was your last menstrual period?"     N/a  Protocols used: Sinus Pain or Congestion-A-AH

## 2021-06-19 DIAGNOSIS — F410 Panic disorder [episodic paroxysmal anxiety] without agoraphobia: Secondary | ICD-10-CM | POA: Diagnosis not present

## 2021-08-03 ENCOUNTER — Other Ambulatory Visit: Payer: Self-pay | Admitting: Gastroenterology

## 2021-08-03 ENCOUNTER — Other Ambulatory Visit: Payer: Self-pay | Admitting: Family Medicine

## 2021-08-04 ENCOUNTER — Other Ambulatory Visit: Payer: Self-pay | Admitting: Gastroenterology

## 2021-08-04 ENCOUNTER — Telehealth: Payer: Self-pay | Admitting: Family Medicine

## 2021-08-04 DIAGNOSIS — K219 Gastro-esophageal reflux disease without esophagitis: Secondary | ICD-10-CM

## 2021-08-04 MED ORDER — OMEPRAZOLE 40 MG PO CPDR: 40.0000 mg | DELAYED_RELEASE_CAPSULE | Freq: Every day | ORAL | 3 refills | Status: DC

## 2021-08-04 NOTE — Telephone Encounter (Signed)
Medication Refill - Medication: albuterol (VENTOLIN HFA) 108 (90 Base) MCG/ACT inhaler   Has the patient contacted their pharmacy? Yes.   (Agent: If no, request that the patient contact the pharmacy for the refill. If patient does not wish to contact the pharmacy document the reason why and proceed with request.) (Agent: If yes, when and what did the pharmacy advise?) the refilled one medication and stated that the other needed to be called in to the pcp office   Preferred Pharmacy (with phone number or street name): [place], [place]  [address] [place] [address]  Phone:  [phone]  Fax:  [phone]  Has the patient been seen for an appointment in the last year OR does the patient have an upcoming appointment? Yes.    Agent: Please be advised that RX refills may take up to 3 business days. We ask that you follow-up with your pharmacy.

## 2021-08-04 NOTE — Telephone Encounter (Signed)
Pt has an appt on Monday with Almyra Free

## 2021-08-05 ENCOUNTER — Other Ambulatory Visit: Payer: Self-pay | Admitting: Family Medicine

## 2021-08-05 DIAGNOSIS — J449 Chronic obstructive pulmonary disease, unspecified: Secondary | ICD-10-CM

## 2021-08-05 MED ORDER — ALBUTEROL SULFATE HFA 108 (90 BASE) MCG/ACT IN AERS: 2.0000 | INHALATION_SPRAY | Freq: Four times a day (QID) | RESPIRATORY_TRACT | 0 refills | Status: DC | PRN

## 2021-08-07 ENCOUNTER — Other Ambulatory Visit: Payer: Self-pay

## 2021-08-07 ENCOUNTER — Ambulatory Visit (INDEPENDENT_AMBULATORY_CARE_PROVIDER_SITE_OTHER): Payer: Medicaid Other | Admitting: Nurse Practitioner

## 2021-08-07 ENCOUNTER — Encounter: Payer: Self-pay | Admitting: Nurse Practitioner

## 2021-08-07 VITALS — BP 132/80 | HR 83 | Temp 97.8°F | Resp 18 | Ht 72.0 in | Wt 307.4 lb

## 2021-08-07 DIAGNOSIS — E669 Obesity, unspecified: Secondary | ICD-10-CM | POA: Diagnosis not present

## 2021-08-07 DIAGNOSIS — E785 Hyperlipidemia, unspecified: Secondary | ICD-10-CM

## 2021-08-07 DIAGNOSIS — M5136 Other intervertebral disc degeneration, lumbar region: Secondary | ICD-10-CM

## 2021-08-07 DIAGNOSIS — M5442 Lumbago with sciatica, left side: Secondary | ICD-10-CM | POA: Diagnosis not present

## 2021-08-07 DIAGNOSIS — I10 Essential (primary) hypertension: Secondary | ICD-10-CM | POA: Diagnosis not present

## 2021-08-07 DIAGNOSIS — E1169 Type 2 diabetes mellitus with other specified complication: Secondary | ICD-10-CM | POA: Diagnosis not present

## 2021-08-07 DIAGNOSIS — G8929 Other chronic pain: Secondary | ICD-10-CM | POA: Diagnosis not present

## 2021-08-07 DIAGNOSIS — J449 Chronic obstructive pulmonary disease, unspecified: Secondary | ICD-10-CM

## 2021-08-07 DIAGNOSIS — M5441 Lumbago with sciatica, right side: Secondary | ICD-10-CM

## 2021-08-07 MED ORDER — ALBUTEROL SULFATE HFA 108 (90 BASE) MCG/ACT IN AERS: 2.0000 | INHALATION_SPRAY | Freq: Four times a day (QID) | RESPIRATORY_TRACT | 3 refills | Status: DC | PRN

## 2021-08-07 NOTE — Progress Notes (Signed)
BP 132/80   Pulse 83   Temp 97.8 F (36.6 C) (Oral)   Resp 18   Ht 6' (1.829 m)   Wt (!) 307 lb 6.4 oz (139.4 kg)   SpO2 95%   BMI 41.69 kg/m    Subjective:    Patient ID: Ronald Lucas, male    DOB: [birth year], 60 y.o.   MRN: [id number]  HPI: Ronald Lucas is a 60 y.o. male, her alone  Chief Complaint  Patient presents with   Asthma    Refill inhaler.   COPD with asthma:  He says he has been out of his albuterol inhaler, because he said the pharmacy was out.  Called pharmacy and they do have albuterol.  He will go pick it up now.  He says he uses it when he needs it.    Diabetes: He says that he does not check his blood sugar at home.  He is supposed to be taking Januvia 100 mg daily, but he is not.  Last A1C was 8.0 01/23/21.  He declined to get labs today. He denies any polyphagia, polydipsia or polyuria.   Dyslipidemia:  He says he can not tolerate statins.  His last LDL was 129 which was 04/05/20. He declined to get labs today.   The 10-year ASCVD risk score (Arnett DK, et al., 2019) is: 38.1%   Values used to calculate the score:     Age: 14 years     Sex: Male     Is Non-Hispanic African American: No     Diabetic: Yes     Tobacco smoker: Yes     Systolic Blood Pressure: 841 mmHg     Is BP treated: Yes     HDL Cholesterol: 33 mg/dL     Total Cholesterol: 209 mg/dL   Obesity:  He says he is not doing anything to lose weight.  Current weight is 307 lbs.  41.69 BMI.  HTN:  Blood pressure is 132/80, he does not check his blood pressure at home. He is supposed to be taking lisinopril 5 mg daily, but sometimes he does not.    Back pain:  He says that he has had this back pain for years.  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   Degenerative disc disease at L2-3, L3-4 and L4-5 with disc bulges but no herniation or compressive stenosis. Mild facet hypertrophy at L3-4 and L4-5. The findings could be associated  with back pain. No neural compression is seen.   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  He says he went to see a neurosurgeon at [facility] and there was confusion regarding his insurance, so he is lost to follow up.  Will put in referral.  He denies any changes but is in obvious discomfort.  He also would only describe his pain as "its the pain I have had for years".  Relevant past medical, surgical, family and social history reviewed and updated as indicated. Interim medical history since our last visit reviewed. Allergies and medications reviewed and updated.  Review of Systems  Constitutional: Negative for fever or weight change.  Respiratory: Positive for cough and shortness of breath.   Cardiovascular: Negative for chest pain or palpitations.  Gastrointestinal: Negative for abdominal pain, no bowel changes.  Musculoskeletal: Negative for gait problem or joint swelling. Positive for back pain Skin: Negative for  rash.  Neurological: Negative for dizziness or headache.  No other specific complaints in a complete review of systems (except as listed in HPI above).     Objective:    BP 132/80   Pulse 83   Temp 97.8 F (36.6 C) (Oral)   Resp 18   Ht 6' (1.829 m)   Wt (!) 307 lb 6.4 oz (139.4 kg)   SpO2 95%   BMI 41.69 kg/m   Wt Readings from Last 3 Encounters:  08/07/21 (!) 307 lb 6.4 oz (139.4 kg)  01/23/21 (!) 310 lb (140.6 kg)  08/03/20 300 lb (136.1 kg)    Physical Exam  Constitutional: Patient appears well-developed and well-nourished. Obese No distress.  HEENT: head atraumatic, normocephalic, pupils equal and reactive to light, neck supple Cardiovascular: Normal rate, regular rhythm and normal heart sounds.  No murmur heard. No BLE edema. Pulmonary/Chest: Effort normal and breath sounds normal. No respiratory distress. Abdominal: Soft.  There is no tenderness. Psychiatric: Patient has a normal  mood and affect. behavior is normal. Judgment and thought content normal.   Results for orders placed or performed in visit on 01/23/21  POCT HgB A1C  Result Value Ref Range   Hemoglobin A1C 8.0 (A) 4.0 - 5.6 %   HbA1c POC (<> result, manual entry)     HbA1c, POC (prediabetic range)     HbA1c, POC (controlled diabetic range)        Assessment & Plan:   1. COPD with asthma ([place])  - albuterol (VENTOLIN HFA) 108 (90 Base) MCG/ACT inhaler; Inhale 2 puffs into the lungs every 6 (six) hours as needed for wheezing or shortness of breath.  Dispense: 1 each; Refill: 3  2. Diabetes mellitus type 2 in obese (HCC)  - CBC with Differential/Platelet - Hemoglobin A1c  3. Dyslipidemia associated with type 2 diabetes mellitus (HCC)  - Lipid panel  4. Morbid obesity ([place])   5. Essential hypertension  - COMPLETE METABOLIC PANEL WITH GFR - CBC with Differential/Platelet  6. Chronic bilateral low back pain with bilateral sciatica  - Ambulatory referral to Neurosurgery  7. Lumbar degenerative disc disease  - Ambulatory referral to Neurosurgery  Follow up plan: Return in about 3 months (around 11/07/2021) for follow up.

## 2021-08-16 ENCOUNTER — Telehealth: Payer: Self-pay | Admitting: Family Medicine

## 2021-08-16 NOTE — Telephone Encounter (Signed)
..   Medicaid Managed Care   Unsuccessful Outreach Note  08/16/2021 Name: Ronald Lucas MRN: [id number] DOB: 1961/04/18  Referred by: Steele Sizer, MD Reason for referral : High Risk Managed Medicaid (I called the patient today to offer him a phone visit appt with the MM team. I left my name and number on his VM.)   An unsuccessful telephone outreach was attempted today. The patient was referred to the case management team for assistance with care management and care coordination.   Follow Up Plan: The care management team will reach out to the patient again over the next 14 days.   [place]

## 2021-09-01 ENCOUNTER — Telehealth: Payer: Self-pay | Admitting: Family Medicine

## 2021-09-01 NOTE — Telephone Encounter (Signed)
..   Medicaid Managed Care   Unsuccessful Outreach Note  09/01/2021 Name: LINC RENNE MRN: [id number] DOB: 09-10-1961  Referred by: Steele Sizer, MD Reason for referral : High Risk Managed Medicaid (Called patient to offer services of the MM team. I left message on his VM.)   A second unsuccessful telephone outreach was attempted today. The patient was referred to the case management team for assistance with care management and care coordination.   Follow Up Plan: The care management team will reach out to the patient again over the next 14 days.   [place]

## 2021-09-13 DIAGNOSIS — F410 Panic disorder [episodic paroxysmal anxiety] without agoraphobia: Secondary | ICD-10-CM | POA: Diagnosis not present

## 2021-09-19 ENCOUNTER — Telehealth: Payer: Self-pay | Admitting: Family Medicine

## 2021-09-19 NOTE — Telephone Encounter (Signed)
.. °  Medicaid Managed Care   Unsuccessful Outreach Note  09/19/2021 Name: Ronald Lucas MRN: [id number] DOB: [birth year]  Referred by: Steele Sizer, MD Reason for referral : High Risk Managed Medicaid (Called the patient today to get him scheduled with the MM Team. I left another message on his VM.)   Third unsuccessful telephone outreach was attempted today. The patient was referred to the case management team for assistance with care management and care coordination. The patient's primary care provider has been notified of our unsuccessful attempts to make or maintain contact with the patient. The care management team is pleased to engage with this patient at any time in the future should he/she be interested in assistance from the care management team.   Follow Up Plan: We have been unable to make contact with the patient for follow up. The care management team is available to follow up with the patient after provider conversation with the patient regarding recommendation for care management engagement and subsequent re-referral to the care management team.   [place], [place]

## 2021-09-28 ENCOUNTER — Other Ambulatory Visit: Payer: Self-pay | Admitting: Family Medicine

## 2021-09-28 NOTE — Telephone Encounter (Signed)
°  Requested medication (s) are on the active medication list: yes  Future visit scheduled: 11/07/21  Notes to clinic:  labs were 18 months ago, were ordered last visit in Nov but not drawn, does have upcoming appt 11/07/21.   Requested Prescriptions  Pending Prescriptions Disp Refills   lisinopril (ZESTRIL) 5 MG tablet 60 tablet 0    Sig: Take 1 tablet (5 mg total) by mouth 2 (two) times daily.     Cardiovascular:  ACE Inhibitors Failed - 09/28/2021  4:14 PM      Failed - Cr in normal range and within 180 days    Creat  Date Value Ref Range Status  04/05/2020 1.12 0.70 - 1.33 mg/dL Final    Comment:    For patients >71 years of age, the reference limit for Creatinine is approximately 13% higher for people identified as African-American. .    Creatinine, Urine  Date Value Ref Range Status  06/29/2020 132 20 - 320 mg/dL Final          Failed - K in normal range and within 180 days    Potassium  Date Value Ref Range Status  04/05/2020 4.2 3.5 - 5.3 mmol/L Final  01/27/2015 4.3 mmol/L Final    Comment:    3.5-5.1 NOTE: New Reference Range  12/07/14           Passed - Patient is not pregnant      Passed - Last BP in normal range    BP Readings from Last 1 Encounters:  08/07/21 132/80          Passed - Valid encounter within last 6 months    Recent Outpatient Visits           1 month ago COPD with asthma [facility])   [facility] Bo Merino, FNP   3 months ago Subacute maxillary sinusitis   [facility] [place], Drue Stager, MD   8 months ago Dyslipidemia associated with type 2 diabetes mellitus [facility])   [facility] Steele Sizer, MD   11 months ago Dyslipidemia associated with type 2 diabetes mellitus [facility])   [facility] Steele Sizer, MD   1 year ago Esophageal dysphagia   [facility] Steele Sizer, MD       Future Appointments             In 1 month  Reece Packer, Myna Hidalgo, [facility], [facility]

## 2021-09-28 NOTE — Telephone Encounter (Signed)
Medication Refill - Medication: lisinopril (ZESTRIL) 5 MG tablet  Has the patient contacted their pharmacy? Yes.   (Pt was advised to call the office Pt has appt in Feb and needs enough to get through to then. Pt needs asap.  He has a ride to [employer] this afternoon, and hopes this can be called in now. Pt unable to get a ride whenever he wants. Preferred Pharmacy (with phone number or street name): [place], [place] Has the patient been seen for an appointment in the last year OR does the patient have an upcoming appointment? Yes.    Agent: Please be advised that RX refills may take up to 3 business days. We ask that you follow-up with your pharmacy.

## 2021-09-29 ENCOUNTER — Other Ambulatory Visit: Payer: Self-pay

## 2021-10-02 ENCOUNTER — Other Ambulatory Visit: Payer: Self-pay | Admitting: Family Medicine

## 2021-10-03 ENCOUNTER — Other Ambulatory Visit: Payer: Self-pay

## 2021-10-03 NOTE — Telephone Encounter (Signed)
Pt is calling to check o the status of his medication. Pt states that he has been out of medication since last week. Pt is extremely upset.

## 2021-11-07 ENCOUNTER — Ambulatory Visit: Payer: Medicaid Other | Admitting: Nurse Practitioner

## 2021-11-07 ENCOUNTER — Telehealth (INDEPENDENT_AMBULATORY_CARE_PROVIDER_SITE_OTHER): Payer: Medicaid Other | Admitting: Nurse Practitioner

## 2021-11-07 DIAGNOSIS — E1169 Type 2 diabetes mellitus with other specified complication: Secondary | ICD-10-CM | POA: Diagnosis not present

## 2021-11-07 DIAGNOSIS — Z789 Other specified health status: Secondary | ICD-10-CM | POA: Diagnosis not present

## 2021-11-07 DIAGNOSIS — I10 Essential (primary) hypertension: Secondary | ICD-10-CM | POA: Diagnosis not present

## 2021-11-07 DIAGNOSIS — E669 Obesity, unspecified: Secondary | ICD-10-CM

## 2021-11-07 DIAGNOSIS — J449 Chronic obstructive pulmonary disease, unspecified: Secondary | ICD-10-CM

## 2021-11-07 DIAGNOSIS — R109 Unspecified abdominal pain: Secondary | ICD-10-CM

## 2021-11-07 DIAGNOSIS — E785 Hyperlipidemia, unspecified: Secondary | ICD-10-CM | POA: Diagnosis not present

## 2021-11-07 DIAGNOSIS — R110 Nausea: Secondary | ICD-10-CM | POA: Diagnosis not present

## 2021-11-07 MED ORDER — ALBUTEROL SULFATE HFA 108 (90 BASE) MCG/ACT IN AERS: 2.0000 | INHALATION_SPRAY | Freq: Four times a day (QID) | RESPIRATORY_TRACT | 3 refills | Status: DC | PRN

## 2021-11-07 MED ORDER — LISINOPRIL 5 MG PO TABS: 5.0000 mg | ORAL_TABLET | Freq: Two times a day (BID) | ORAL | 1 refills | Status: DC

## 2021-11-07 MED ORDER — ONDANSETRON HCL 4 MG PO TABS: 4.0000 mg | ORAL_TABLET | Freq: Three times a day (TID) | ORAL | 0 refills | Status: DC | PRN

## 2021-11-07 MED ORDER — SITAGLIPTIN PHOSPHATE 100 MG PO TABS: 100.0000 mg | ORAL_TABLET | Freq: Every day | ORAL | 1 refills | Status: DC

## 2021-11-07 NOTE — Progress Notes (Signed)
Name: Ronald Lucas   MRN: [id number]    DOB: [birth year]   Date:11/07/2021       Progress Note  Subjective  Chief Complaint  Chief Complaint  Patient presents with   Follow-up   Hypertension   Diabetes    I connected with  [name]  on 11/07/21 at  1:20 PM EST by a video enabled telemedicine application and verified that I am speaking with the correct person using two identifiers.  I discussed the limitations of evaluation and management by telemedicine and the availability of in person appointments. The patient expressed understanding and agreed to proceed with a virtual visit  Staff also discussed with the patient that there may be a patient responsible charge related to this service. Patient Location: home Provider Location: [facility] Additional Individuals present: alone  HPI  Nausea: He says he has been nauseated for a few days.  He denies any fever, chest pain or shortness of breath.  He says he does have some abdominal cramping.  He says he is able to tolerate fluids. He denies any diarrhea or constipation. If he does not improve he will need to come in for in person evaluation.  COPD with asthma: He says he uses his albuterol inhaler, every once in awhile. He denies any need for it recently.   Diabetes: He does not check his blood sugar at home.  He is supposed to be taking Januvia 100 mg daily.  Last A1C was 8.0 on 01/23/2021.  He declined labs at his last visit.  Will need labs at his next visit.  He denies any polyuria, polydipsia or polyuria.   Dyslipidemia: He does not tolerate statins.  His last LDL was 129 which was 04/05/2020.  He is due for labs.  He declined labs at last visit. He will need to get labs at his next visit.   The 10-year ASCVD risk score (Arnett DK, et al., 2019) is: 39.3%   Values used to calculate the score:     Age: 61 years     Sex: Male     Is Non-Hispanic African American: No     Diabetic: Yes     Tobacco smoker: Yes     Systolic Blood  Pressure: 132 mmHg     Is BP treated: Yes     HDL Cholesterol: 33 mg/dL     Total Cholesterol: 209 mg/dL   Obesity: His last weight was 307 lbs with a BMI of 41.69.  He is not currently doing anything to lose weight.   HTN: He does not check his blood pressure at home.  He is prescribed lisinopril 5 mg daily but does not take it all the time.    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 [facility]) 03/17/2019   COPD with asthma ([place]) 05/09/2018   PTSD (post-traumatic stress disorder) 05/09/2018   Chronic bilateral low back pain without sciatica 07/19/2017   Skin tag 07/19/2017   Multiple allergies 07/19/2017   Bipolar disorder, current episode mixed, moderate ([place]) 07/19/2017   Panic disorder 07/19/2017   BMI 40.0-44.9, adult ([place]) 07/19/2017   Vascular calcification 07/19/2017   Complex regional pain syndrome i of right lower limb 01/18/2016   Dupuytren's contracture 01/18/2016   Hypertension 05/28/2011    Social History   Tobacco Use   Smoking status:  Every Day    Packs/day: 0.10    Types: Cigarettes    Start date: 03/17/1983   Smokeless tobacco: Never  Substance Use Topics   Alcohol use: No     Current Outpatient Medications:    albuterol (VENTOLIN HFA) 108 (90 Base) MCG/ACT inhaler, Inhale 2 puffs into the lungs every 6 (six) hours as needed for wheezing or shortness of breath., Disp: 1 each, Rfl: 3   ALPRAZolam (XANAX) 0.25 MG tablet, Take 0.25 mg by mouth 2 (two) times daily as needed for anxiety., Disp: , Rfl:    clindamycin (CLEOCIN) 300 MG capsule, Take 1 capsule (300 mg total) by mouth in the morning and at bedtime., Disp: 20 capsule, Rfl: 0   lisinopril (ZESTRIL) 5 MG tablet, Take 1 tablet by mouth twice daily, Disp: 60 tablet, Rfl: 0   sitaGLIPtin (JANUVIA) 100 MG tablet, Take 1 tablet (100 mg  total) by mouth daily., Disp: 30 tablet, Rfl: 0   omeprazole (PRILOSEC) 40 MG capsule, Take 1 capsule (40 mg total) by mouth daily before breakfast., Disp: 30 capsule, Rfl: 3  Allergies  Allergen Reactions   Peanut Oil Anaphylaxis   Peanut-Containing Drug Products Anaphylaxis   Penicillins Hives    Other reaction(s): Anaphylaxis Throat swelling   Tetanus Toxoids     Other reaction(s): Swelling   Buspar [Buspirone] Other (See Comments)    Motion sickness   Farxiga [Dapagliflozin] Itching   Flexeril [Cyclobenzaprine] Other (See Comments), Nausea Only and Nausea And Vomiting    hallucinations   Gabapentin Nausea And Vomiting   Methocarbamol Nausea Only   Morphine And Related Other (See Comments)    Gets violent if takes Morphine    Norvasc [Amlodipine Besylate]     Sob, wheezing    Prednisone     Other reaction(s): Hallucination hallucinations hallucinations   Xyzal [Levocetirizine]     Dry eyes   Lisinopril     Muscle cramp - able to tolerate lower dose 5 mg, does not want to change medication    I personally reviewed active problem list, medication list, allergies, notes from last encounter with the patient/caregiver today.  ROS  Constitutional: Negative for fever or weight change.  Respiratory: Negative for cough and shortness of breath.   Cardiovascular: Negative for chest pain or palpitations.  Gastrointestinal:no bowel changes. Positive for abdominal cramping and nausea Musculoskeletal: Negative for gait problem or joint swelling.  Skin: Negative for rash.  Neurological: Negative for dizziness or headache.  No other specific complaints in a complete review of systems (except as listed in HPI above).   Objective  Virtual encounter, vitals not obtained.  There is no height or weight on file to calculate BMI.  Nursing Note and Vital Signs reviewed.  Physical Exam  Awake, alert and oriented, speaking in complete sentences  No results found for this or any  previous visit (from the past 72 hour(s)).  Assessment & Plan  1. Nausea -seek in person appointment if no improvement -push fluids, eat BRAT diet - ondansetron (ZOFRAN) 4 MG tablet; Take 1 tablet (4 mg total) by mouth every 8 (eight) hours as needed for nausea or vomiting.  Dispense: 20 tablet; Refill: 0  2. Abdominal cramping -seek in person appointment if no improvement -push fluids, eat BRAT diet - ondansetron (ZOFRAN) 4 MG tablet; Take 1 tablet (4 mg total) by mouth every 8 (eight) hours as needed for nausea or vomiting.  Dispense: 20 tablet; Refill: 0  3. COPD with asthma ([place])  - albuterol (  VENTOLIN HFA) 108 (90 Base) MCG/ACT inhaler; Inhale 2 puffs into the lungs every 6 (six) hours as needed for wheezing or shortness of breath.  Dispense: 1 each; Refill: 3  4. Diabetes mellitus type 2 in obese ([place]) -needs labs at next appointment - sitaGLIPtin (JANUVIA) 100 MG tablet; Take 1 tablet (100 mg total) by mouth daily.  Dispense: 90 tablet; Refill: 1  5. Dyslipidemia associated with type 2 diabetes mellitus ([place]) -needs labs at next appointment -try avoiding saturated fats in your diet  6. Statin intolerance   7. Morbid obesity ([place])  -try working on healthy food choices and portion control  8. Essential hypertension -needs labs at next appointment - lisinopril (ZESTRIL) 5 MG tablet; Take 1 tablet (5 mg total) by mouth 2 (two) times daily.  Dispense: 180 tablet; Refill: 1   -Red flags and when to present for emergency care or RTC including fever >101.78F, chest pain, shortness of breath, new/worsening/un-resolving symptoms,  reviewed with patient at time of visit. Follow up and care instructions discussed and provided in AVS. - I discussed the assessment and treatment plan with the patient. The patient was provided an opportunity to ask questions and all were answered. The patient agreed with the plan and demonstrated an understanding of the instructions.  I provided 20  minutes of non-face-to-face time during this encounter.  Bo Merino, FNP

## 2021-12-07 DIAGNOSIS — F410 Panic disorder [episodic paroxysmal anxiety] without agoraphobia: Secondary | ICD-10-CM | POA: Diagnosis not present

## 2022-01-03 ENCOUNTER — Telehealth: Payer: Self-pay | Admitting: Gastroenterology

## 2022-01-03 NOTE — Telephone Encounter (Signed)
Patient left vm requesting a refill on medication (medication no specified). Also states that he would like his medication modified. Requesting a call back. ?

## 2022-01-04 ENCOUNTER — Other Ambulatory Visit: Payer: Self-pay

## 2022-01-04 MED ORDER — OMEPRAZOLE 20 MG PO CPDR: 20.0000 mg | DELAYED_RELEASE_CAPSULE | Freq: Two times a day (BID) | ORAL | 3 refills | Status: DC

## 2022-01-04 NOTE — Telephone Encounter (Signed)
Called patient back and he stated that he wanted a refill on his Omeprazole. He asked for it to be taken Omeprazole 20 MG twice a day. Therefore, patient was given a refill sent to his pharmacy.  ?

## 2022-02-27 DIAGNOSIS — F410 Panic disorder [episodic paroxysmal anxiety] without agoraphobia: Secondary | ICD-10-CM | POA: Diagnosis not present

## 2022-05-30 DIAGNOSIS — F410 Panic disorder [episodic paroxysmal anxiety] without agoraphobia: Secondary | ICD-10-CM | POA: Diagnosis not present

## 2022-07-09 ENCOUNTER — Telehealth: Payer: Self-pay | Admitting: Orthopedic Surgery

## 2022-07-09 DIAGNOSIS — M545 Low back pain, unspecified: Secondary | ICD-10-CM

## 2022-07-09 NOTE — Telephone Encounter (Signed)
Left detailed message on voice mail to get xrays before appt and to call the office if he has questions.

## 2022-07-09 NOTE — Telephone Encounter (Signed)
He has appt tomorrow. Please have him come early to get lumbar xrays if possible.   I ordered them.   Thanks!

## 2022-07-09 NOTE — Progress Notes (Deleted)
Referring Physician:  No referring provider defined for this encounter.  Primary Physician:  Ronald Sizer, MD  History of Present Illness: 07/09/2022 [name] has a history of bipolar, HTN, PTSD, ***.   Last seen by Ronald Lucas in neurosurgery in 2019 for his lumbar spine. He was sent to [facility] pain for possible ESIs. He declined these due to history of adverse reactions with steroids.       Duration: *** Location: *** Quality: *** Severity: ***  Precipitating: aggravated by *** Modifying factors: made better by *** Weakness: none Timing: *** Bowel/Bladder Dysfunction: none  Conservative measures:  Physical therapy: ***  Multimodal medical therapy including regular antiinflammatories: ***  Injections: *** epidural steroid injections  Past Surgery: ***  Ronald Lucas has ***no symptoms of cervical myelopathy.  The symptoms are causing a significant impact on the patient's life.   Review of Systems:  A 10 point review of systems is negative, except for the pertinent positives and negatives detailed in the HPI.  Past Medical History: Past Medical History:  Diagnosis Date   ADHD    Allergy    Asthma    Bipolar 1 disorder ([place])    Depression    Hypertension    Panic attacks    Panic disorder    PTSD (post-traumatic stress disorder)     Past Surgical History: Past Surgical History:  Procedure Laterality Date   CARDIAC SURGERY     COLONOSCOPY WITH PROPOFOL N/A 07/21/2020   Procedure: COLONOSCOPY WITH PROPOFOL;  Surgeon: Ronald Bellows, MD;  Location: [facility] ENDOSCOPY;  Service: Gastroenterology;  Laterality: N/A;   ESOPHAGOGASTRODUODENOSCOPY (EGD) WITH PROPOFOL N/A 07/21/2020   Procedure: ESOPHAGOGASTRODUODENOSCOPY (EGD) WITH PROPOFOL;  Surgeon: Ronald Bellows, MD;  Location: [facility] ENDOSCOPY;  Service: Gastroenterology;  Laterality: N/A;   FRACTURE SURGERY      Allergies: Allergies as of 07/10/2022 - Review Complete 11/07/2021  Allergen Reaction Noted    Peanut oil Anaphylaxis 07/19/2017   Peanut-containing drug products Anaphylaxis 07/19/2017   Penicillins Hives 05/28/2011   Tetanus toxoids  05/28/2011   Buspar [buspirone] Other (See Comments) 06/14/2015   Farxiga [dapagliflozin] Itching 01/25/2021   Flexeril [cyclobenzaprine] Other (See Comments), Nausea Only, and Nausea And Vomiting 07/04/2015   Gabapentin Nausea And Vomiting 07/19/2017   Methocarbamol Nausea Only 07/19/2017   Morphine and related Other (See Comments) 06/14/2015   Norvasc [amlodipine besylate]  03/25/2019   Prednisone  06/14/2015   Xyzal [levocetirizine]  10/12/2020   Lisinopril  03/25/2019    Medications: Outpatient Encounter Medications as of 07/10/2022  Medication Sig   albuterol (VENTOLIN HFA) 108 (90 Base) MCG/ACT inhaler Inhale 2 puffs into the lungs every 6 (six) hours as needed for wheezing or shortness of breath.   ALPRAZolam (XANAX) 0.25 MG tablet Take 0.25 mg by mouth 2 (two) times daily as needed for anxiety.   ALPRAZolam (XANAX) 0.5 MG tablet Take 0.5 mg by mouth 2 (two) times daily.   clindamycin (CLEOCIN) 300 MG capsule Take 1 capsule (300 mg total) by mouth in the morning and at bedtime.   erythromycin (E-MYCIN) 250 MG tablet    lisinopril (ZESTRIL) 5 MG tablet Take 1 tablet (5 mg total) by mouth 2 (two) times daily.   omeprazole (PRILOSEC) 20 MG capsule Take 1 capsule (20 mg total) by mouth 2 (two) times daily before a meal.   ondansetron (ZOFRAN) 4 MG tablet Take 1 tablet (4 mg total) by mouth every 8 (eight) hours as needed for nausea or vomiting.   sitaGLIPtin (JANUVIA)  100 MG tablet Take 1 tablet (100 mg total) by mouth daily.   No facility-administered encounter medications on file as of 07/10/2022.    Social History: Social History   Tobacco Use   Smoking status: Every Day    Packs/day: 0.10    Types: Cigarettes    Start date: 03/17/1983   Smokeless tobacco: Never  Vaping Use   Vaping Use: Never used  Substance Use Topics    Alcohol use: No   Drug use: No    Family Medical History: Family History  Problem Relation Age of Onset   Colon cancer Sister     Physical Examination: There were no vitals filed for this visit.  General: Patient is well developed, well nourished, calm, collected, and in no apparent distress. Attention to examination is appropriate.  Respiratory: Patient is breathing without any difficulty.   NEUROLOGICAL:     Awake, alert, oriented to person, place, and time.  Speech is clear and fluent. Fund of knowledge is appropriate.   Cranial Nerves: Pupils equal round and reactive to light.  Facial tone is symmetric.  Facial sensation is symmetric.  ROM of spine:  *** ROM of cervical spine *** pain *** ROM of lumbar spine *** pain  No abnormal lesions on exposed skin.   Strength: Side Biceps Triceps Deltoid Interossei Grip Wrist Ext. Wrist Flex.  R '5 5 5 5 5 5 5  '$ L '5 5 5 5 5 5 5   '$ Side Iliopsoas Quads Hamstring PF DF EHL  R '5 5 5 5 5 5  '$ L '5 5 5 5 5 5   '$ Reflexes are ***2+ and symmetric at the biceps, triceps, brachioradialis, patella and achilles.   Hoffman's is absent.  Clonus is not present.   Bilateral upper and lower extremity sensation is intact to light touch.    No evidence of dysmetria noted.  Gait is normal.   ***No difficulty with tandem gait.    Medical Decision Making  Imaging: Lumbar xrays 07/09/22:  ***  I have personally reviewed the images and agree with the above interpretation.  Assessment and Plan: Ronald Lucas is a pleasant 61 y.o. male with ***  Above treatment options discussed with patient and following plan made:   - Order for physical therapy for *** spine ***. - Continue on current medications including ***. Reviewed proper dosing along with risks and benefits. Take and NSAIDs with food.      I spent a total of *** minutes in face-to-face and non-face-to-face activities related to this patient's care today.  Thank you for involving me  in the care of this patient.   Ronald Boot PA-C Dept. of Neurosurgery

## 2022-07-10 ENCOUNTER — Telehealth: Payer: Self-pay | Admitting: Orthopedic Surgery

## 2022-07-10 ENCOUNTER — Ambulatory Visit: Payer: Medicaid Other | Admitting: Orthopedic Surgery

## 2022-07-10 NOTE — Telephone Encounter (Signed)
Patient called in stating he was a past yates patient diagnosed with Lumbar spinal stenosis, and has had increased pain over the years and being that he is allergic to so many different medications it is hard for him to find treatment and he feels he is being passed around with his other doctors and no one is actually trying to help him with the pain. He would like to come in and see the doctor if he can he just did not want to set an appt if he was not going to be able to be helped. Please advise.

## 2022-07-12 NOTE — Telephone Encounter (Signed)
Scheduled 07/18/22

## 2022-07-18 ENCOUNTER — Ambulatory Visit (INDEPENDENT_AMBULATORY_CARE_PROVIDER_SITE_OTHER): Payer: Medicaid Other | Admitting: Orthopedic Surgery

## 2022-07-18 ENCOUNTER — Ambulatory Visit (INDEPENDENT_AMBULATORY_CARE_PROVIDER_SITE_OTHER): Payer: Medicaid Other

## 2022-07-18 DIAGNOSIS — M545 Low back pain, unspecified: Secondary | ICD-10-CM

## 2022-07-18 DIAGNOSIS — G8929 Other chronic pain: Secondary | ICD-10-CM

## 2022-07-18 NOTE — Progress Notes (Addendum)
Orthopedic Spine Surgery Office Note  Assessment: Patient is a 61 y.o. male with chronic axial low back pain.   Plan: -Patient has tried multiple conservative treatments over the years with no relief with any of these treatment modalities.  He has been previously referred to pain management but has had issues because he is on benzodiazepines.  He states that previous providers at these pain management clinics would not treat him because he is on benzodiazepines for his anxiety and panic attacks.  I explained that I do not feel surgery would provide him any benefit but would just expose him to potential risks since is main issue is axial low back pain.  We talked about another referral to a pain management clinic.  He was agreeable to trying to establish somewhere else.  I provided him with a referral and 2 phone numbers of different practices that he has not been to to see if they would treat him even if he is on benzodiazepines.  At the end the visit, he asked if I would prescribe him anything.  I told him that I do not prescribe any narcotic pain medications except after surgery. He should return to the office on an as needed basis. ___________________________________________________________________________   History:  Patient is a 61 y.o. male who presents today for lumbar spine.  Patient states that he has had low back pain going back into the 1990s.  There is no trauma or injury that brought on the pain.  He feels it in his lower back.  Over the years, he has tried many conservative treatments without any significant relief.  It has been getting progressively worse recently. He states that he gets periodic numbness and tingling into his anterior thighs. They resolve in their own. No other paresthesias or numbness.  His consistent and worst pain is in his lower back. Pain is worse with sitting or laying down on his back.  It improves in a slightly flexed position.  He reports trying to go to pain  management clinics but they would not work with him because he is on benzodiazepines for his anxiety and panic attacks.  He states that he had previously been told he should undergo operative intervention.  He cannot recall what procedure was recommended to him.  He ended up deciding not to proceed with surgery at that time. No bowel or bladder incontinence. No saddle anesthesia.    Past medical history: ADHD Bipolar Panic attacks PTSD Depression  Allergies (14) listed in chart: peanut oil, penicillins (anaphylaxis), tetanus toxoids, buspar, farxiga, flexeril, gabapentin, methocarbamol, morphine, norvasc, prednisone, xyzal, lisinopril  Past spine surgeries: none  Social history: History shows use of nicotine product (smoking, vaping, patches, smokeless)    Physical Exam:  General: no acute distress, appears stated age, groans frequently throughout exam, noted to be wincing in pain at times with no provocative maneuvers Neurologic: alert, answering questions appropriately, following commands Respiratory: unlabored breathing on room air, symmetric chest rise Psychiatric: appropriate affect, normal cadence to speech   MSK (spine):  -Strength exam      Left  Right EHL    5/5  5/5 TA    5/5  5/5 GSC    5/5  5/5 Knee extension  5/5  5/5 Hip flexion   5/5  5/5  Has pain with rotation when the shoulders and pelvis are rotated simultaneously with the back  Has significant tenderness to superficial palpation over the lower lumbar paraspinal muscles  -Sensory exam    Sensation  intact to light touch in L3-S1 nerve distributions of bilateral lower extremities. No numbness in saddle region.  -Achilles DTR: 1/4 on the left, 1/4 on the right -Patellar tendon DTR: 1/4 on the left, 1/4 on the right  -Seated straight leg and contralateral straight leg raise negative -Clonus: no beats bilaterally -Appears well-balanced coronally and sagittally  -Left hip exam: No pain through range  of motion, negative Corky Sox -Right hip exam: No pain through range of motion, negative Faber  Imaging: XR of the lumbar spine from 07/18/2022 was independently reviewed and interpreted, showing no fracture or dislocation. Transitional anatomy with what appears to be a lumbarized S1. No significant disc height loss. No evidence of instability on flexion/extension views.    Patient name: Ronald Lucas Patient MRN: [id number] Date of visit: 07/18/22

## 2022-08-06 DIAGNOSIS — F410 Panic disorder [episodic paroxysmal anxiety] without agoraphobia: Secondary | ICD-10-CM | POA: Diagnosis not present

## 2022-08-09 ENCOUNTER — Emergency Department
Admission: EM | Admit: 2022-08-09 | Discharge: 2022-08-09 | Discharge status: Against Medical Advice | Payer: Medicaid Other | Attending: Emergency Medicine | Admitting: Emergency Medicine

## 2022-08-09 ENCOUNTER — Other Ambulatory Visit: Payer: Self-pay

## 2022-08-09 ENCOUNTER — Emergency Department: Payer: Medicaid Other

## 2022-08-09 DIAGNOSIS — M545 Low back pain, unspecified: Secondary | ICD-10-CM | POA: Diagnosis not present

## 2022-08-09 DIAGNOSIS — Z5321 Procedure and treatment not carried out due to patient leaving prior to being seen by health care provider: Secondary | ICD-10-CM | POA: Insufficient documentation

## 2022-08-09 DIAGNOSIS — Y93E1 Activity, personal bathing and showering: Secondary | ICD-10-CM | POA: Insufficient documentation

## 2022-08-09 LAB — CBC WITH DIFFERENTIAL/PLATELET
Abs Immature Granulocytes: 0.04 10*3/uL (ref 0.00–0.07)
Basophils Absolute: 0.1 10*3/uL (ref 0.0–0.1)
Basophils Relative: 2 %
Eosinophils Absolute: 0.2 10*3/uL (ref 0.0–0.5)
Eosinophils Relative: 2 %
HCT: 45.4 % (ref 39.0–52.0)
Hemoglobin: 16.2 g/dL (ref 13.0–17.0)
Immature Granulocytes: 1 %
Lymphocytes Relative: 42 %
Lymphs Abs: 3.4 10*3/uL (ref 0.7–4.0)
MCH: 28.8 pg (ref 26.0–34.0)
MCHC: 35.7 g/dL (ref 30.0–36.0)
MCV: 80.6 fL (ref 80.0–100.0)
Monocytes Absolute: 0.5 10*3/uL (ref 0.1–1.0)
Monocytes Relative: 6 %
Neutro Abs: 3.9 10*3/uL (ref 1.7–7.7)
Neutrophils Relative %: 47 %
Platelets: 215 10*3/uL (ref 150–400)
RBC: 5.63 MIL/uL (ref 4.22–5.81)
RDW: 12.6 % (ref 11.5–15.5)
WBC: 8.2 10*3/uL (ref 4.0–10.5)
nRBC: 0 % (ref 0.0–0.2)

## 2022-08-09 LAB — COMPREHENSIVE METABOLIC PANEL
ALT: 18 U/L (ref 0–44)
AST: 17 U/L (ref 15–41)
Albumin: 4.6 g/dL (ref 3.5–5.0)
Alkaline Phosphatase: 64 U/L (ref 38–126)
Anion gap: 14 (ref 5–15)
BUN: 13 mg/dL (ref 6–20)
CO2: 21 mmol/L — ABNORMAL LOW (ref 22–32)
Calcium: 9.2 mg/dL (ref 8.9–10.3)
Chloride: 97 mmol/L — ABNORMAL LOW (ref 98–111)
Creatinine, Ser: 1.16 mg/dL (ref 0.61–1.24)
GFR, Estimated: >60 mL/min (ref >60)
Glucose, Bld: 375 mg/dL — ABNORMAL HIGH (ref 70–99)
Potassium: 3.9 mmol/L (ref 3.5–5.1)
Sodium: 132 mmol/L — ABNORMAL LOW (ref 135–145)
Total Bilirubin: 0.7 mg/dL (ref 0.3–1.2)
Total Protein: 8 g/dL (ref 6.5–8.1)

## 2022-08-09 LAB — URINALYSIS, ROUTINE W REFLEX MICROSCOPIC
Bilirubin Urine: NEGATIVE
Glucose, UA: >=500 mg/dL — AB
Ketones, ur: 5 mg/dL — AB
Leukocytes,Ua: NEGATIVE
Nitrite: NEGATIVE
Protein, ur: NEGATIVE mg/dL
Specific Gravity, Urine: 1.026 (ref 1.005–1.030)
pH: 5 (ref 5.0–8.0)

## 2022-08-09 LAB — ETHANOL: Alcohol, Ethyl (B): 130 mg/dL — ABNORMAL HIGH (ref <10)

## 2022-08-09 LAB — SALICYLATE LEVEL: Salicylate Lvl: <7 mg/dL — ABNORMAL LOW (ref 7.0–30.0)

## 2022-08-09 LAB — ACETAMINOPHEN LEVEL: Acetaminophen (Tylenol), Serum: <10 ug/mL — ABNORMAL LOW (ref 10–30)

## 2022-08-09 NOTE — ED Notes (Signed)
Pt noted ambulating out of triage to lobby and outside, getting into passenger side of vehicle and leaving

## 2022-08-09 NOTE — ED Triage Notes (Signed)
Pt BIB EMS for lower back pain that started while he was in his shower. Pt has hx of back issues. Per EMS, pt takes excessive amounts of tylenol and ETOH to deal with pain. Per pt, he had mobility issues when the pain occurred.

## 2022-08-09 NOTE — ED Notes (Signed)
Pt visualized several times turning around in the bed to lay in the prone position and then getting out of bed to stand. Pt is somewhat unsteady on his feet. Pt informed several times by this tech and Whitney, RN that he needs to get in bed d/t being unsteady on his ft. Pt stated, "I am fine." Pt stated he was going to leave and then stated "I can not leave here without having a wheelchair." Pt informed that we did not give out wheelchairs to pts d/t [place] not having them here. Pt standing beside the bed.

## 2022-09-18 ENCOUNTER — Telehealth: Payer: Self-pay | Admitting: Orthopedic Surgery

## 2022-09-18 NOTE — Telephone Encounter (Signed)
Patient stats he is not getting the care is expected to get. Please call.[phone], patient states he wants answer and wants someone to call him.

## 2022-09-18 NOTE — Telephone Encounter (Signed)
I tried to call just now.  Number is not in service.  Will try to call at a later time.

## 2022-09-19 ENCOUNTER — Other Ambulatory Visit: Payer: Self-pay | Admitting: Radiology

## 2022-09-19 DIAGNOSIS — M545 Low back pain, unspecified: Secondary | ICD-10-CM

## 2022-09-19 NOTE — Telephone Encounter (Signed)
I called patient (the number was entered incorrectly in the message--[phone]) after speaking with Dr. Laurance Flatten, and Dr. Laurance Flatten advised that we can order an MRI of the back. He has agreed to this.  I also advised that once he get the MRI scheduled to call our office and make an appt to review the MRI at least 48 hours after the scan so that they have time to get it read before his appt. He states that he understands this.

## 2022-10-04 ENCOUNTER — Other Ambulatory Visit: Payer: Self-pay | Admitting: Radiology

## 2022-10-04 ENCOUNTER — Telehealth: Payer: Self-pay | Admitting: Orthopedic Surgery

## 2022-10-04 ENCOUNTER — Telehealth: Payer: Self-pay | Admitting: Family Medicine

## 2022-10-04 DIAGNOSIS — M545 Low back pain, unspecified: Secondary | ICD-10-CM

## 2022-10-04 NOTE — Telephone Encounter (Signed)
Copied from [place] [id number]. Topic: General - Inquiry >> Oct 04, 2022  9:46 AM Penni Bombard wrote: Pt is calling asking if Dr.Sowles can get his MRI switched to [place] from [place].  I di tell him that she did not order the MRI the orthopedic doctor did.  CB#

## 2022-10-04 NOTE — Telephone Encounter (Signed)
I called and spoke with patient, I advised that we had put in a new referral to [facility]. He asked why they denied him because they would not tell him why, I advised that it says on the referral that they "do not have anything else to offer you". He states that "they were assholes to me". He also asked about his MRI, I advised that it appears that it is "pending review" and looks like they are waiting on insurance to let tem know if it has been approved or denied. He asked that he go to somewhere in [place] to have it. He states that he is having issues with his eyes and that he was having troubling seeing. I advised that he see his optometrist for this, he said that he doesn't have one here and that there are several there is [place] that he needs to call around and see who can see him the soonest. Patient was very short and disrepectful with me and once he told me about the pain management office where [place], I advised to him that I had addressed his concerns: 1) new referral to pain management, 2) MRI moved to somewhere in [place], 3) his MRI is still pending review. I told him to have a great day and the conversation ended on both ends.

## 2022-10-04 NOTE — Telephone Encounter (Signed)
Pt called requesting a call back about his denial for pain management. Please call pt at [phone]

## 2022-10-10 DIAGNOSIS — H3589 Other specified retinal disorders: Secondary | ICD-10-CM | POA: Diagnosis not present

## 2022-10-10 DIAGNOSIS — H25041 Posterior subcapsular polar age-related cataract, right eye: Secondary | ICD-10-CM | POA: Diagnosis not present

## 2022-10-10 DIAGNOSIS — H25042 Posterior subcapsular polar age-related cataract, left eye: Secondary | ICD-10-CM | POA: Diagnosis not present

## 2022-10-12 ENCOUNTER — Other Ambulatory Visit: Payer: Medicaid Other

## 2022-10-12 NOTE — Progress Notes (Unsigned)
Name: Ronald Lucas   MRN: [id number]    DOB: 1961/05/25   Date:10/15/2022       Progress Note  Subjective  Chief Complaint  Follow Up  HPI   COPD with asthma/Chronic bronchitis: He says he uses his albuterol inhaler, every once in awhile. He has a daily productive cough but is not willing to quit smoking at this time and does not want any other medications  Diabetes: A1C today is over 13% today, he is not taking any medications, he states he is allergic to everything and does not want to be a [place] pig and die taking new drugs. He states lost weight, he overeats when stressed. He is unable to take statins due to muscle aches and does not want to take any other medications for cholesterol    Dyslipidemia: He does not tolerate statins.  His last LDL was 129 which was 04/05/2020.  He refuses recheck labs since not interested in taking medications    The 10-year ASCVD risk score (Arnett DK, et al., 2019) is: 38.5%   Values used to calculate the score:     Age: 62 years     Sex: Male     Is Non-Hispanic African American: No     Diabetic: Yes     Tobacco smoker: Yes     Systolic Blood Pressure: 528 mmHg     Is BP treated: Yes     HDL Cholesterol: 33 mg/dL     Total Cholesterol: 209 mg/dL    Morbid obesity BMI still over 35 with co-morbidities suck as dyslipidemia , HTN and DM: He has lost over 30 lbs since last visit, he is not sure why. He refuses having labs or studies done.    HTN: He does not check his blood pressure at home.  He is prescribed lisinopril 5 mg daily sometimes twice daily, he states it is the only medication he can tolerate. Denies chest pain but has intermittent SOB  Visual problems: seeing [facility] and will have cataract surgery, explained risk of complications due to uncontrolled DM  Chronic back pain: recent visit to [facility] and labs showed hyponatremia and also high alcohol content - over 100. He states he drank 3 shots of tequila before trying to wash his hair  and fell in his shower. He was seeing Ortho  Bipolar depression: unde the care of psychiatrist and states only medication he can tolerate is valium     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 [facility]) 03/17/2019   COPD with asthma 05/09/2018   PTSD (post-traumatic stress disorder) 05/09/2018   Chronic bilateral low back pain without sciatica 07/19/2017   Skin tag 07/19/2017   Multiple allergies 07/19/2017   Bipolar disorder, current episode mixed, moderate ([place]) 07/19/2017   Panic disorder 07/19/2017   BMI 40.0-44.9, adult ([place]) 07/19/2017   Vascular calcification 07/19/2017   Complex regional pain syndrome i of right lower limb 01/18/2016   Dupuytren's contracture 01/18/2016   Hypertension 05/28/2011    Past Surgical History:  Procedure Laterality Date   CARDIAC SURGERY     COLONOSCOPY WITH PROPOFOL N/A 07/21/2020   Procedure: COLONOSCOPY WITH PROPOFOL;  Surgeon: Jonathon Bellows, MD;  Location: [facility] ENDOSCOPY;  Service: Gastroenterology;  Laterality: N/A;   ESOPHAGOGASTRODUODENOSCOPY (EGD) WITH PROPOFOL N/A 07/21/2020   Procedure: ESOPHAGOGASTRODUODENOSCOPY (EGD) WITH PROPOFOL;  Surgeon: Jonathon Bellows, MD;  Location: [facility] ENDOSCOPY;  Service: Gastroenterology;  Laterality: N/A;   FRACTURE SURGERY      Family History  Problem Relation Age of Onset   Colon cancer Sister     Social History   Tobacco Use   Smoking status: Every Day    Packs/day: 0.10    Types: Cigarettes    Start date: 03/17/1983   Smokeless tobacco: Never  Substance Use Topics   Alcohol use: No     Current Outpatient Medications:    albuterol (VENTOLIN HFA) 108 (90 Base) MCG/ACT inhaler, Inhale 2 puffs into the lungs every 6 (six) hours as needed for wheezing or shortness of breath., Disp: 1 each, Rfl: 3   diazepam (VALIUM) 5 MG tablet,  Take 5 mg by mouth 2 (two) times daily., Disp: , Rfl:    lisinopril (ZESTRIL) 5 MG tablet, Take 1 tablet (5 mg total) by mouth 2 (two) times daily., Disp: 180 tablet, Rfl: 1   omeprazole (PRILOSEC) 20 MG capsule, Take 1 capsule (20 mg total) by mouth 2 (two) times daily before a meal., Disp: 180 capsule, Rfl: 3   sitaGLIPtin (JANUVIA) 100 MG tablet, Take 1 tablet (100 mg total) by mouth daily., Disp: 90 tablet, Rfl: 1  Allergies  Allergen Reactions   Peanut Oil Anaphylaxis   Peanut-Containing Drug Products Anaphylaxis   Penicillins Hives    Other reaction(s): Anaphylaxis Throat swelling   Tetanus Toxoids     Other reaction(s): Swelling   Buspar [Buspirone] Other (See Comments)    Motion sickness   Farxiga [Dapagliflozin] Itching   Flexeril [Cyclobenzaprine] Other (See Comments), Nausea Only and Nausea And Vomiting    hallucinations   Gabapentin Nausea And Vomiting   Methocarbamol Nausea Only   Morphine And Related Other (See Comments)    Gets violent if takes Morphine    Norvasc [Amlodipine Besylate]     Sob, wheezing    Prednisone     Other reaction(s): Hallucination hallucinations hallucinations   Xyzal [Levocetirizine]     Dry eyes   Lisinopril     Muscle cramp - able to tolerate lower dose 5 mg, does not want to change medication    I personally reviewed active problem list, medication list, allergies, family history, social history, health maintenance with the patient/caregiver today.   ROS  Constitutional: Negative for fever, positive for  weight change.  Respiratory: positive for cough and intermittent shortness of breath.   Cardiovascular: Negative for chest pain or palpitations.  Gastrointestinal: Negative for abdominal pain, no bowel changes.  Musculoskeletal: positive  for gait problem or joint swelling.  Skin: Negative for rash.  Neurological: Negative for dizziness or headache.  No other specific complaints in a complete review of systems (except as listed  in HPI above).   Objective  Vitals:   10/15/22 1409  BP: 130/76  Pulse: 91  Resp: 16  SpO2: 96%  Weight: 273 lb (123.8 kg)  Height: 6' (1.829 m)    Body mass index is 37.03 kg/m.  Physical Exam  Constitutional: Patient appears well-developed and well-nourished. Obese  No distress.  HEENT: head atraumatic, normocephalic, pupils equal and reactive to light, neck supple Cardiovascular: Normal rate, regular rhythm and normal heart sounds.  No murmur heard. No BLE edema. Pulmonary/Chest: Effort normal and breath sounds normal. No respiratory distress. Abdominal: Soft.  There is no tenderness. Muscular skeletal: patient is in pain, leaning on the counter. Pacing intermittently  Psychiatric: Patient has a normal mood and affect. behavior  is normal. Judgment and thought content normal.   Recent Results (from the past 2160 hour(s))  CBC with Differential     Status: None   Collection Time: 08/09/22  2:18 AM  Result Value Ref Range   WBC 8.2 4.0 - 10.5 K/uL   RBC 5.63 4.22 - 5.81 MIL/uL   Hemoglobin 16.2 13.0 - 17.0 g/dL   HCT 45.4 39.0 - 52.0 %   MCV 80.6 80.0 - 100.0 fL   MCH 28.8 26.0 - 34.0 pg   MCHC 35.7 30.0 - 36.0 g/dL   RDW 12.6 11.5 - 15.5 %   Platelets 215 150 - 400 K/uL   nRBC 0.0 0.0 - 0.2 %   Neutrophils Relative % 47 %   Neutro Abs 3.9 1.7 - 7.7 K/uL   Lymphocytes Relative 42 %   Lymphs Abs 3.4 0.7 - 4.0 K/uL   Monocytes Relative 6 %   Monocytes Absolute 0.5 0.1 - 1.0 K/uL   Eosinophils Relative 2 %   Eosinophils Absolute 0.2 0.0 - 0.5 K/uL   Basophils Relative 2 %   Basophils Absolute 0.1 0.0 - 0.1 K/uL   Immature Granulocytes 1 %   Abs Immature Granulocytes 0.04 0.00 - 0.07 K/uL    Comment: Performed at [facility], [address] [place], [place] [address]  Comprehensive metabolic panel     Status: Abnormal   Collection Time: 08/09/22  2:18 AM  Result Value Ref Range   Sodium 132 (L) 135 - 145 mmol/L   Potassium 3.9 3.5 - 5.1 mmol/L    Chloride 97 (L) 98 - 111 mmol/L   CO2 21 (L) 22 - 32 mmol/L   Glucose, Bld 375 (H) 70 - 99 mg/dL    Comment: Glucose reference range applies only to samples taken after fasting for at least 8 hours.   BUN 13 6 - 20 mg/dL   Creatinine, Ser 1.16 0.61 - 1.24 mg/dL   Calcium 9.2 8.9 - 10.3 mg/dL   Total Protein 8.0 6.5 - 8.1 g/dL   Albumin 4.6 3.5 - 5.0 g/dL   AST 17 15 - 41 U/L   ALT 18 0 - 44 U/L   Alkaline Phosphatase 64 38 - 126 U/L   Total Bilirubin 0.7 0.3 - 1.2 mg/dL   GFR, Estimated >60 >60 mL/min    Comment: (NOTE) Calculated using the CKD-EPI Creatinine Equation (2021)    Anion gap 14 5 - 15    Comment: Performed at [facility], [address] [place], [place] [address]  Ethanol     Status: Abnormal   Collection Time: 08/09/22  2:18 AM  Result Value Ref Range   Alcohol, Ethyl (B) 130 (H) <10 mg/dL    Comment: (NOTE) Lowest detectable limit for serum alcohol is 10 mg/dL.  For medical purposes only. Performed at [facility], [address] [place], [place] [address]   Acetaminophen level     Status: Abnormal   Collection Time: 08/09/22  2:18 AM  Result Value Ref Range   Acetaminophen (Tylenol), Serum <10 (L) 10 - 30 ug/mL    Comment: (NOTE) Therapeutic concentrations vary significantly. A range of 10-30 ug/mL  may be an effective concentration for many patients. However, some  are best treated at concentrations outside of this range. Acetaminophen concentrations >150 ug/mL at 4 hours after ingestion  and >50 ug/mL at 12 hours after ingestion are often associated with  toxic reactions.  Performed at [facility], [address],  [place], [place] [address]   Salicylate level     Status: Abnormal   Collection Time: 08/09/22  2:18 AM  Result Value Ref Range   Salicylate Lvl <7.7 (L) 7.0 - 30.0 mg/dL    Comment: Performed at [facility], [address] [place], [place] [address]  Urinalysis, Routine w reflex microscopic      Status: Abnormal   Collection Time: 08/09/22  2:18 AM  Result Value Ref Range   Color, Urine YELLOW (A) YELLOW   APPearance CLEAR (A) CLEAR   Specific Gravity, Urine 1.026 1.005 - 1.030   pH 5.0 5.0 - 8.0   Glucose, UA >=500 (A) NEGATIVE mg/dL   Hgb urine dipstick SMALL (A) NEGATIVE   Bilirubin Urine NEGATIVE NEGATIVE   Ketones, ur 5 (A) NEGATIVE mg/dL   Protein, ur NEGATIVE NEGATIVE mg/dL   Nitrite NEGATIVE NEGATIVE   Leukocytes,Ua NEGATIVE NEGATIVE   RBC / HPF 0-5 0 - 5 RBC/hpf   WBC, UA 0-5 0 - 5 WBC/hpf   Bacteria, UA MANY (A) NONE SEEN   Squamous Epithelial / HPF 6-10 0 - 5   Mucus PRESENT     Comment: Performed at [facility], [address] [place], [place] [address]      PHQ2/9:    10/15/2022    2:05 PM 11/07/2021   12:40 PM 08/07/2021    1:18 PM 06/12/2021   10:04 AM 01/23/2021    2:51 PM  Depression screen PHQ 2/9  Decreased Interest 0 0 0 0 3  Down, Depressed, Hopeless 3 0 0 0 3  PHQ - 2 Score 3 0 0 0 6  Altered sleeping 1 0   3  Tired, decreased energy 3 0   3  Change in appetite 1 0   0  Feeling bad or failure about yourself  3 0   3  Trouble concentrating 1 0   0  Moving slowly or fidgety/restless 0 0   0  Suicidal thoughts 0 0   0  PHQ-9 Score 12 0   15  Difficult doing work/chores  Not difficult at all       phq 9 is positive   Fall Risk:    10/15/2022    2:05 PM 11/07/2021   12:40 PM 08/07/2021    1:17 PM 06/12/2021   10:04 AM 01/23/2021    2:51 PM  Fall Risk   Falls in the past year? 0 0 0 0 1  Number falls in past yr: 0 0 0 0 1  Injury with Fall? 0 0 0 0 1  Risk for fall due to : No Fall Risks No Fall Risks  No Fall Risks   Follow up Falls prevention discussed Falls prevention discussed Falls evaluation completed Falls prevention discussed    Refused diabetic foot exam    Functional Status Survey: Is the patient deaf or have difficulty hearing?: No Does the patient have difficulty seeing, even when wearing glasses/contacts?:  Yes Does the patient have difficulty concentrating, remembering, or making decisions?: No Does the patient have difficulty walking or climbing stairs?: Yes Does the patient have difficulty dressing or bathing?: No Does the patient have difficulty doing errands alone such as visiting a doctor's office or shopping?: No    Assessment & Plan  1. Dyslipidemia associated with type 2 diabetes mellitus (HCC)  - POCT HgB A1C Refuses any type of medication and not following a diabetic diet   2. Bipolar disorder, current episode mixed, moderate ([place])  Keep follow up with psychiatrist   3. Morbid obesity ([place])  He states unable to eat healthy since he only has food stamps   4. Hypertension associated with diabetes ([place])  BP is at goal   5. Simple chronic bronchitis (HCC)  Refuses anything but albuterol prn   6. Statin intolerance   7. Essential hypertension  - lisinopril (ZESTRIL) 5 MG tablet; Take 1 tablet (5 mg total) by mouth 2 (two) times daily.  Dispense: 180 tablet; Refill: 1  8. Chronic bilateral low back pain with bilateral sciatica   9. Hyponatremia   10. COPD with asthma   11. Medical non-compliance

## 2022-10-15 ENCOUNTER — Ambulatory Visit: Payer: Medicaid Other | Admitting: Family Medicine

## 2022-10-15 ENCOUNTER — Encounter: Payer: Self-pay | Admitting: Family Medicine

## 2022-10-15 VITALS — BP 130/76 | HR 91 | Resp 16 | Ht 72.0 in | Wt 273.0 lb

## 2022-10-15 DIAGNOSIS — M5441 Lumbago with sciatica, right side: Secondary | ICD-10-CM

## 2022-10-15 DIAGNOSIS — Z789 Other specified health status: Secondary | ICD-10-CM | POA: Diagnosis not present

## 2022-10-15 DIAGNOSIS — Z91199 Patient's noncompliance with other medical treatment and regimen due to unspecified reason: Secondary | ICD-10-CM | POA: Diagnosis not present

## 2022-10-15 DIAGNOSIS — J4489 Other specified chronic obstructive pulmonary disease: Secondary | ICD-10-CM

## 2022-10-15 DIAGNOSIS — F3162 Bipolar disorder, current episode mixed, moderate: Secondary | ICD-10-CM | POA: Diagnosis not present

## 2022-10-15 DIAGNOSIS — E871 Hypo-osmolality and hyponatremia: Secondary | ICD-10-CM

## 2022-10-15 DIAGNOSIS — J410 Simple chronic bronchitis: Secondary | ICD-10-CM

## 2022-10-15 DIAGNOSIS — I10 Essential (primary) hypertension: Secondary | ICD-10-CM | POA: Diagnosis not present

## 2022-10-15 DIAGNOSIS — E1159 Type 2 diabetes mellitus with other circulatory complications: Secondary | ICD-10-CM | POA: Diagnosis not present

## 2022-10-15 DIAGNOSIS — E1169 Type 2 diabetes mellitus with other specified complication: Secondary | ICD-10-CM | POA: Diagnosis not present

## 2022-10-15 DIAGNOSIS — G8929 Other chronic pain: Secondary | ICD-10-CM

## 2022-10-15 DIAGNOSIS — E785 Hyperlipidemia, unspecified: Secondary | ICD-10-CM

## 2022-10-15 DIAGNOSIS — I152 Hypertension secondary to endocrine disorders: Secondary | ICD-10-CM

## 2022-10-15 DIAGNOSIS — M5442 Lumbago with sciatica, left side: Secondary | ICD-10-CM

## 2022-10-15 DIAGNOSIS — E669 Obesity, unspecified: Secondary | ICD-10-CM

## 2022-10-15 LAB — POCT GLYCOSYLATED HEMOGLOBIN (HGB A1C): Hemoglobin A1C: 13.1 % — AB (ref 4.0–5.6)

## 2022-10-15 MED ORDER — LISINOPRIL 5 MG PO TABS: 5.0000 mg | ORAL_TABLET | Freq: Two times a day (BID) | ORAL | 1 refills | Status: DC

## 2022-10-18 ENCOUNTER — Encounter: Payer: Self-pay | Admitting: Ophthalmology

## 2022-10-22 DIAGNOSIS — H25042 Posterior subcapsular polar age-related cataract, left eye: Secondary | ICD-10-CM | POA: Diagnosis not present

## 2022-10-25 DIAGNOSIS — F410 Panic disorder [episodic paroxysmal anxiety] without agoraphobia: Secondary | ICD-10-CM | POA: Diagnosis not present

## 2022-10-25 NOTE — Discharge Instructions (Signed)
   Cataract Surgery, Care After ? ?This sheet gives you information about how to care for yourself after your surgery.  Your ophthalmologist may also give you more specific instructions.  If you have problems or questions, contact your doctor at [facility], [phone]. ? ?What can I expect after the surgery? ?It is common to have: ?Itching ?Foreign body sensation (feels like a grain of sand in the eye) ?Watery discharge (excess tearing) ?Sensitivity to light and touch ?Bruising in or around the eye ?Mild blurred vision ? ?Follow these instructions at home: ?Do not touch or rub your eyes. ?You may be told to wear a protective shield or sunglasses to protect your eyes. ?Do not put a contact lens in the operative eye unless your doctor approves. ?Keep the lids and face clean and dry. ?Do not allow water to hit you directly in the face while showering. ?Keep soap and shampoo out of your eyes. ?Do not use eye makeup for 1 week. ? ?Check your eye every day for signs of infection.  Watch for: ?Redness, swelling, or pain. ?Fluid, blood or pus. ?Worsening vision. ?Worsening sensitivity to light or touch. ? ?Activity: ?During the first day, avoid bending over and reading.  You may resume reading and bending the next day. ?Do not drive or use heavy machinery for at least 24 hours. ?Avoid strenuous activities for 1 week.  Activities such as walking, treadmill, exercise bike, and climbing stairs are okay. ?Do not lift heavy (>20 pound) objects for 1 week. ?Do not do yardwork, gardening, or dirty housework (mopping, cleaning bathrooms, vacuuming, etc.) for 1 week. ?Do not swim or use a hot tub for 2 weeks. ?Ask your doctor when you can return to work. ? ?General Instructions: ?Take or apply prescription and over-the-counter medicines as directed by your doctor, including eyedrops and ointments. ?Resume medications discontinued prior to surgery, unless told otherwise by your doctor. ?Keep all follow up appointments as  scheduled. ? ?Contact a health care provider if: ?You have increased bruising around your eye. ?You have pain that is not helped with medication. ?You have a fever. ?You have fluid, pus, or blood coming from your eye or incision. ?Your sensitivity to light gets worse. ?You have spots (floaters) of flashing lights in your vision. ?You have nausea or vomiting. ? ?Go to the nearest emergency room or call 911 if: ?You have sudden loss of vision. ?You have severe, worsening eye pain. ? ?

## 2022-10-30 ENCOUNTER — Encounter
Admission: RE | Disposition: A | Discharge status: Home / Self Care | Payer: Self-pay | Source: Home / Self Care | Attending: Ophthalmology

## 2022-10-30 ENCOUNTER — Encounter: Payer: Self-pay | Admitting: Ophthalmology

## 2022-10-30 ENCOUNTER — Other Ambulatory Visit: Payer: Self-pay

## 2022-10-30 ENCOUNTER — Ambulatory Visit: Payer: Medicaid Other | Admitting: Anesthesiology

## 2022-10-30 ENCOUNTER — Ambulatory Visit
Admission: RE | Admit: 2022-10-30 | Discharge: 2022-10-30 | Disposition: A | Discharge status: Home / Self Care | Payer: Medicaid Other | Attending: Ophthalmology | Admitting: Ophthalmology

## 2022-10-30 DIAGNOSIS — Z6841 Body Mass Index (BMI) 40.0 and over, adult: Secondary | ICD-10-CM | POA: Insufficient documentation

## 2022-10-30 DIAGNOSIS — Z79899 Other long term (current) drug therapy: Secondary | ICD-10-CM | POA: Diagnosis not present

## 2022-10-30 DIAGNOSIS — I10 Essential (primary) hypertension: Secondary | ICD-10-CM | POA: Insufficient documentation

## 2022-10-30 DIAGNOSIS — F419 Anxiety disorder, unspecified: Secondary | ICD-10-CM | POA: Diagnosis not present

## 2022-10-30 DIAGNOSIS — G709 Myoneural disorder, unspecified: Secondary | ICD-10-CM | POA: Diagnosis not present

## 2022-10-30 DIAGNOSIS — F909 Attention-deficit hyperactivity disorder, unspecified type: Secondary | ICD-10-CM | POA: Diagnosis not present

## 2022-10-30 DIAGNOSIS — H2512 Age-related nuclear cataract, left eye: Secondary | ICD-10-CM | POA: Insufficient documentation

## 2022-10-30 DIAGNOSIS — F4310 Post-traumatic stress disorder, unspecified: Secondary | ICD-10-CM | POA: Insufficient documentation

## 2022-10-30 DIAGNOSIS — F17200 Nicotine dependence, unspecified, uncomplicated: Secondary | ICD-10-CM | POA: Diagnosis not present

## 2022-10-30 DIAGNOSIS — F319 Bipolar disorder, unspecified: Secondary | ICD-10-CM | POA: Diagnosis not present

## 2022-10-30 DIAGNOSIS — M5442 Lumbago with sciatica, left side: Secondary | ICD-10-CM | POA: Insufficient documentation

## 2022-10-30 DIAGNOSIS — E669 Obesity, unspecified: Secondary | ICD-10-CM | POA: Diagnosis not present

## 2022-10-30 DIAGNOSIS — J449 Chronic obstructive pulmonary disease, unspecified: Secondary | ICD-10-CM | POA: Insufficient documentation

## 2022-10-30 DIAGNOSIS — H25042 Posterior subcapsular polar age-related cataract, left eye: Secondary | ICD-10-CM | POA: Diagnosis not present

## 2022-10-30 DIAGNOSIS — M5441 Lumbago with sciatica, right side: Secondary | ICD-10-CM | POA: Insufficient documentation

## 2022-10-30 DIAGNOSIS — T7840XA Allergy, unspecified, initial encounter: Secondary | ICD-10-CM | POA: Diagnosis not present

## 2022-10-30 DIAGNOSIS — Z6836 Body mass index (BMI) 36.0-36.9, adult: Secondary | ICD-10-CM | POA: Diagnosis not present

## 2022-10-30 DIAGNOSIS — F418 Other specified anxiety disorders: Secondary | ICD-10-CM | POA: Diagnosis not present

## 2022-10-30 HISTORY — PX: CATARACT EXTRACTION W/PHACO: SHX586

## 2022-10-30 SURGERY — PHACOEMULSIFICATION, CATARACT, WITH IOL INSERTION
Anesthesia: Monitor Anesthesia Care | Site: Eye | Laterality: Left

## 2022-10-30 MED ORDER — MIDAZOLAM HCL 2 MG/2ML IJ SOLN
INTRAMUSCULAR | Status: DC | PRN
  Administered 2022-10-30: 2 mg via INTRAVENOUS

## 2022-10-30 MED ORDER — SIGHTPATH DOSE#1 BSS IO SOLN
INTRAOCULAR | Status: DC | PRN
  Administered 2022-10-30: 1 mL via INTRAMUSCULAR

## 2022-10-30 MED ORDER — SIGHTPATH DOSE#1 NA CHONDROIT SULF-NA HYALURON 40-17 MG/ML IO SOLN
INTRAOCULAR | Status: DC | PRN
  Administered 2022-10-30: 1 mL via INTRAOCULAR

## 2022-10-30 MED ORDER — ARMC OPHTHALMIC DILATING DROPS
1.0000 | OPHTHALMIC | Status: DC | PRN
  Administered 2022-10-30 (×3): 1 via OPHTHALMIC

## 2022-10-30 MED ORDER — MOXIFLOXACIN HCL 0.5 % OP SOLN
OPHTHALMIC | Status: DC | PRN
  Administered 2022-10-30: .2 mL via OPHTHALMIC

## 2022-10-30 MED ORDER — FENTANYL CITRATE (PF) 100 MCG/2ML IJ SOLN
INTRAMUSCULAR | Status: DC | PRN
  Administered 2022-10-30 (×2): 50 ug via INTRAVENOUS

## 2022-10-30 MED ORDER — TETRACAINE HCL 0.5 % OP SOLN
1.0000 [drp] | OPHTHALMIC | Status: DC | PRN
  Administered 2022-10-30 (×2): 1 [drp] via OPHTHALMIC

## 2022-10-30 MED ORDER — DEXMEDETOMIDINE HCL IN NACL 200 MCG/50ML IV SOLN
INTRAVENOUS | Status: DC | PRN
  Administered 2022-10-30 (×5): 4 ug via INTRAVENOUS

## 2022-10-30 MED ORDER — SIGHTPATH DOSE#1 BSS IO SOLN
INTRAOCULAR | Status: DC | PRN
  Administered 2022-10-30: 55 mL via OPHTHALMIC

## 2022-10-30 MED ORDER — BRIMONIDINE TARTRATE-TIMOLOL 0.2-0.5 % OP SOLN
OPHTHALMIC | Status: DC | PRN
  Administered 2022-10-30: 1 [drp] via OPHTHALMIC

## 2022-10-30 MED ORDER — SIGHTPATH DOSE#1 BSS IO SOLN
INTRAOCULAR | Status: DC | PRN
  Administered 2022-10-30: 15 mL

## 2022-10-30 SURGICAL SUPPLY — 9 items
CATARACT SUITE SIGHTPATH (MISCELLANEOUS) ×1 IMPLANT
FEE CATARACT SUITE SIGHTPATH (MISCELLANEOUS) ×1 IMPLANT
GLOVE BIOGEL PI IND STRL 8 (GLOVE) ×1 IMPLANT
GLOVE SURG ENC TEXT LTX SZ8 (GLOVE) ×1 IMPLANT
LENS IOL TECNIS EYHANCE 19.0 (Intraocular Lens) IMPLANT
NDL FILTER BLUNT 18X1 1/2 (NEEDLE) ×1 IMPLANT
NEEDLE FILTER BLUNT 18X1 1/2 (NEEDLE) ×1 IMPLANT
SYR 3ML LL SCALE MARK (SYRINGE) ×1 IMPLANT
WATER STERILE IRR 250ML POUR (IV SOLUTION) ×1 IMPLANT
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
## 2022-10-30 NOTE — Transfer of Care (Signed)
Immediate Anesthesia Transfer of Care Note  Patient: Ronald Lucas  Procedure(s) Performed: CATARACT EXTRACTION PHACO AND INTRAOCULAR LENS PLACEMENT (IOC) LEFT  7.80  00:54.4 (Left: Eye)  Patient Location: PACU  Anesthesia Type: MAC  Level of Consciousness: awake, alert  and patient cooperative  Airway and Oxygen Therapy: Patient Spontanous Breathing and Patient connected to supplemental oxygen  Post-op Assessment: Post-op Vital signs reviewed, Patient's Cardiovascular Status Stable, Respiratory Function Stable, Patent Airway and No signs of Nausea or vomiting  Post-op Vital Signs: Reviewed and stable  Complications: No notable events documented.

## 2022-10-30 NOTE — Op Note (Signed)
PREOPERATIVE DIAGNOSIS:  Nuclear sclerotic cataract of the left eye.   POSTOPERATIVE DIAGNOSIS:  Nuclear sclerotic cataract of the left eye.   OPERATIVE PROCEDURE:ORPROCALL@   SURGEON:  Birder Robson, MD.   ANESTHESIA:  Anesthesiologist: Iran Ouch, MD CRNA: Moises Blood, CRNA  1.      Managed anesthesia care. 2.     0.85m of Shugarcaine was instilled following the paracentesis   COMPLICATIONS:  None.   TECHNIQUE:   Stop and chop   DESCRIPTION OF PROCEDURE:  The patient was examined and consented in the preoperative holding area where the aforementioned topical anesthesia was applied to the left eye and then brought back to the Operating Room where the left eye was prepped and draped in the usual sterile ophthalmic fashion and a lid speculum was placed. A paracentesis was created with the side port blade and the anterior chamber was filled with viscoelastic. A near clear corneal incision was performed with the steel keratome. A continuous curvilinear capsulorrhexis was performed with a cystotome followed by the capsulorrhexis forceps. Hydrodissection and hydrodelineation were carried out with BSS on a blunt cannula. The lens was removed in a stop and chop  technique and the remaining cortical material was removed with the irrigation-aspiration handpiece. The capsular bag was inflated with viscoelastic and the Technis [id number] lens was placed in the capsular bag without complication. The remaining viscoelastic was removed from the eye with the irrigation-aspiration handpiece. The wounds were hydrated. The anterior chamber was flushed with BSS and the eye was inflated to physiologic pressure. 0.126mVigamox was placed in the anterior chamber. The wounds were found to be water tight. The eye was dressed with Combigan. The patient was given protective glasses to wear throughout the day and a shield with which to sleep tonight. The patient was also given drops with which to begin a drop  regimen today and will follow-up with me in one day. Implant Name Type Inv. Item Serial No. Manufacturer Lot No. LRB No. Used Action  LENS IOL TECNIS EYHANCE 19.0 - [id number] Lens LENS IOL TECNIS EYHANCE 19.0 [id number]  Left 1 Implanted    Procedure(s): CATARACT EXTRACTION PHACO AND INTRAOCULAR LENS PLACEMENT (IOC) LEFT  7.80  00:54.4 (Left)  Electronically signed: WiBirder Robson/30/2024 12:56 PM

## 2022-10-30 NOTE — Anesthesia Postprocedure Evaluation (Signed)
Anesthesia Post Note  Patient: Ronald Lucas  Procedure(s) Performed: CATARACT EXTRACTION PHACO AND INTRAOCULAR LENS PLACEMENT (IOC) LEFT  7.80  00:54.4 (Left: Eye)  Patient location during evaluation: PACU Anesthesia Type: MAC Level of consciousness: awake and alert Pain management: pain level controlled Vital Signs Assessment: post-procedure vital signs reviewed and stable Respiratory status: spontaneous breathing, nonlabored ventilation and respiratory function stable Cardiovascular status: blood pressure returned to baseline and stable Postop Assessment: no apparent nausea or vomiting Anesthetic complications: no   No notable events documented.   Last Vitals:  Vitals:   10/30/22 1127 10/30/22 1301  BP: (!) 160/97 137/85  Pulse: 75 73  Resp: 18 14  Temp: 36.9 C   SpO2: 96% 96%    Last Pain:  Vitals:   10/30/22 1301  TempSrc:   PainSc: 0-No pain                 Iran Ouch

## 2022-10-30 NOTE — H&P (Signed)
[facility]   Primary Care Physician:  Steele Sizer, MD Ophthalmologist: Dr. George Ina  Pre-Procedure History & Physical: HPI:  Ronald Lucas is a 62 y.o. male here for cataract surgery.   Past Medical History:  Diagnosis Date   ADHD    Allergy    Asthma    Bipolar 1 disorder ([place])    Depression    Hypertension    Panic attacks    Panic disorder    PTSD (post-traumatic stress disorder)     Past Surgical History:  Procedure Laterality Date   CARDIAC SURGERY     COLONOSCOPY WITH PROPOFOL N/A 07/21/2020   Procedure: COLONOSCOPY WITH PROPOFOL;  Surgeon: Jonathon Bellows, MD;  Location[facility] ENDOSCOPY;  Service: Gastroenterology;  Laterality: N/A;   ESOPHAGOGASTRODUODENOSCOPY (EGD) WITH PROPOFOL N/A 07/21/2020   Procedure: ESOPHAGOGASTRODUODENOSCOPY (EGD) WITH PROPOFOL;  Surgeon: Jonathon Bellows, MD;  Location: [facility] ENDOSCOPY;  Service: Gastroenterology;  Laterality: N/A;   FRACTURE SURGERY      Prior to Admission medications   Medication Sig Start Date End Date Taking? Authorizing Provider  albuterol (VENTOLIN HFA) 108 (90 Base) MCG/ACT inhaler Inhale 2 puffs into the lungs every 6 (six) hours as needed for wheezing or shortness of breath. 11/07/21  Yes Bo Merino, FNP  diazepam (VALIUM) 5 MG tablet Take 5 mg by mouth 2 (two) times daily. 10/08/22  Yes [provider]  diphenhydrAMINE (BENADRYL) 25 mg capsule Take 25 mg by mouth every 6 (six) hours as needed.   Yes [provider]  lisinopril (ZESTRIL) 5 MG tablet Take 1 tablet (5 mg total) by mouth 2 (two) times daily. 10/15/22  Yes Sowles, Drue Stager, MD  omeprazole (PRILOSEC) 20 MG capsule Take 1 capsule (20 mg total) by mouth 2 (two) times daily before a meal. 01/04/22  Yes Jonathon Bellows, MD    Allergies as of 10/12/2022 - Review Complete 08/09/2022  Allergen Reaction Noted   Peanut oil Anaphylaxis 07/19/2017   Peanut-containing drug products Anaphylaxis 07/19/2017   Penicillins Hives 05/28/2011   Tetanus  toxoids  05/28/2011   Buspar [buspirone] Other (See Comments) 06/14/2015   Farxiga [dapagliflozin] Itching 01/25/2021   Flexeril [cyclobenzaprine] Other (See Comments), Nausea Only, and Nausea And Vomiting 07/04/2015   Gabapentin Nausea And Vomiting 07/19/2017   Methocarbamol Nausea Only 07/19/2017   Morphine and related Other (See Comments) 06/14/2015   Norvasc [amlodipine besylate]  03/25/2019   Prednisone  06/14/2015   Xyzal [levocetirizine]  10/12/2020   Lisinopril  03/25/2019    Family History  Problem Relation Age of Onset   Colon cancer Sister     Social History   Socioeconomic History   Marital status: Single    Spouse name: Not on file   Number of children: 0   Years of education: Not on file   Highest education level: Not on file  Occupational History   Occupation: disabled  Tobacco Use   Smoking status: Every Day    Packs/day: 0.10    Types: Cigarettes    Start date: 03/17/1983   Smokeless tobacco: Never  Vaping Use   Vaping Use: Never used  Substance and Sexual Activity   Alcohol use: No   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   Sexual activity: Not Currently  Other Topics Concern   Not on file  Social History Narrative   Not on file   Social Determinants of Health   Financial Resource Strain: Low Risk  (03/09/2019)   Overall Financial  Resource Strain (CARDIA)    Difficulty of Paying Living Expenses: Not hard at all  Food Insecurity: No Food Insecurity (03/09/2019)   Hunger Vital Sign    Worried About Running Out of Food in the Last Year: Never true    Ran Out of Food in the Last Year: Never true  Transportation Needs: No Transportation Needs (03/09/2019)   PRAPARE - Hydrologist (Medical): No    Lack of Transportation (Non-Medical): No  Physical Activity: Inactive (03/09/2019)   Exercise Vital Sign    Days of Exercise per Week: 0 days    Minutes of Exercise per Session: 0 min  Stress: No Stress Concern  Present (03/09/2019)   [place]    Feeling of Stress : Only a little  Social Connections: Moderately Isolated (03/09/2019)   Social Connection and Isolation Panel [NHANES]    Frequency of Communication with Friends and Family: Twice a week    Frequency of Social Gatherings with Friends and Family: Twice a week    Attends Religious Services: Never    Marine scientist or Organizations: No    Attends Archivist Meetings: Never    Marital Status: Never married  Intimate Partner Violence: Not At Risk (03/09/2019)   Humiliation, Afraid, Rape, and Kick questionnaire    Fear of Current or Ex-Partner: No    Emotionally Abused: No    Physically Abused: No    Sexually Abused: No    Review of Systems: See HPI, otherwise negative ROS  Physical Exam: BP (!) 160/97   Pulse 75   Temp 98.5 F (36.9 C) (Temporal)   Resp 18   Ht 6' (1.829 m)   Wt 122.9 kg   SpO2 96%   BMI 36.75 kg/m  General:   Alert, cooperative in NAD Head:  Normocephalic and atraumatic. Respiratory:  Normal work of breathing. Cardiovascular:  RRR  Impression/Plan: Ronald Lucas is here for cataract surgery.  Risks, benefits, limitations, and alternatives regarding cataract surgery have been reviewed with the patient.  Questions have been answered.  All parties agreeable.   Birder Robson, MD  10/30/2022, 12:29 PM

## 2022-10-31 ENCOUNTER — Encounter: Payer: Self-pay | Admitting: Ophthalmology

## 2022-11-01 ENCOUNTER — Encounter: Payer: Self-pay | Admitting: Anesthesiology

## 2022-11-01 ENCOUNTER — Encounter: Payer: Self-pay | Admitting: Ophthalmology

## 2022-11-08 NOTE — Discharge Instructions (Signed)

## 2022-11-13 ENCOUNTER — Ambulatory Visit: Admission: RE | Admit: 2022-11-13 | Payer: Medicaid Other | Source: Home / Self Care | Admitting: Ophthalmology

## 2022-11-13 SURGERY — PHACOEMULSIFICATION, CATARACT, WITH IOL INSERTION
Anesthesia: Topical | Laterality: Left

## 2022-11-22 ENCOUNTER — Encounter: Payer: Self-pay | Admitting: Ophthalmology

## 2022-11-27 ENCOUNTER — Ambulatory Visit: Payer: Medicaid Other | Admitting: General Practice

## 2022-11-27 ENCOUNTER — Other Ambulatory Visit: Payer: Self-pay

## 2022-11-27 ENCOUNTER — Encounter: Payer: Self-pay | Admitting: Ophthalmology

## 2022-11-27 ENCOUNTER — Ambulatory Visit
Admission: RE | Admit: 2022-11-27 | Discharge: 2022-11-27 | Disposition: A | Discharge status: Home / Self Care | Payer: Medicaid Other | Attending: Ophthalmology | Admitting: Ophthalmology

## 2022-11-27 ENCOUNTER — Encounter
Admission: RE | Disposition: A | Discharge status: Home / Self Care | Payer: Self-pay | Source: Home / Self Care | Attending: Ophthalmology

## 2022-11-27 DIAGNOSIS — F17210 Nicotine dependence, cigarettes, uncomplicated: Secondary | ICD-10-CM | POA: Insufficient documentation

## 2022-11-27 DIAGNOSIS — J449 Chronic obstructive pulmonary disease, unspecified: Secondary | ICD-10-CM | POA: Insufficient documentation

## 2022-11-27 DIAGNOSIS — Z79899 Other long term (current) drug therapy: Secondary | ICD-10-CM | POA: Diagnosis not present

## 2022-11-27 DIAGNOSIS — I10 Essential (primary) hypertension: Secondary | ICD-10-CM | POA: Insufficient documentation

## 2022-11-27 DIAGNOSIS — F319 Bipolar disorder, unspecified: Secondary | ICD-10-CM | POA: Diagnosis not present

## 2022-11-27 DIAGNOSIS — H2511 Age-related nuclear cataract, right eye: Secondary | ICD-10-CM | POA: Diagnosis not present

## 2022-11-27 DIAGNOSIS — F410 Panic disorder [episodic paroxysmal anxiety] without agoraphobia: Secondary | ICD-10-CM | POA: Insufficient documentation

## 2022-11-27 HISTORY — PX: CATARACT EXTRACTION W/PHACO: SHX586

## 2022-11-27 SURGERY — PHACOEMULSIFICATION, CATARACT, WITH IOL INSERTION
Anesthesia: Topical | Laterality: Right

## 2022-11-27 MED ORDER — MOXIFLOXACIN HCL 0.5 % OP SOLN
OPHTHALMIC | Status: DC | PRN
  Administered 2022-11-27: .2 mL via OPHTHALMIC

## 2022-11-27 MED ORDER — SIGHTPATH DOSE#1 BSS IO SOLN
INTRAOCULAR | Status: DC | PRN
  Administered 2022-11-27: 56 mL via OPHTHALMIC

## 2022-11-27 MED ORDER — MIDAZOLAM HCL 2 MG/2ML IJ SOLN
INTRAMUSCULAR | Status: DC | PRN
  Administered 2022-11-27: 2 mg via INTRAVENOUS

## 2022-11-27 MED ORDER — TETRACAINE HCL 0.5 % OP SOLN
1.0000 [drp] | OPHTHALMIC | Status: AC | PRN
  Administered 2022-11-27 (×3): 1 [drp] via OPHTHALMIC

## 2022-11-27 MED ORDER — SIGHTPATH DOSE#1 BSS IO SOLN
INTRAOCULAR | Status: DC | PRN
  Administered 2022-11-27: 2 mL

## 2022-11-27 MED ORDER — ARMC OPHTHALMIC DILATING DROPS
1.0000 | OPHTHALMIC | Status: AC | PRN
  Administered 2022-11-27 (×3): 1 via OPHTHALMIC

## 2022-11-27 MED ORDER — FENTANYL CITRATE (PF) 100 MCG/2ML IJ SOLN
INTRAMUSCULAR | Status: DC | PRN
  Administered 2022-11-27: 100 ug via INTRAVENOUS

## 2022-11-27 MED ORDER — SIGHTPATH DOSE#1 NA CHONDROIT SULF-NA HYALURON 40-17 MG/ML IO SOLN
INTRAOCULAR | Status: DC | PRN
  Administered 2022-11-27: 1 mL via INTRAOCULAR

## 2022-11-27 MED ORDER — SIGHTPATH DOSE#1 BSS IO SOLN
INTRAOCULAR | Status: DC | PRN
  Administered 2022-11-27: 15 mL via INTRAOCULAR

## 2022-11-27 MED ORDER — BRIMONIDINE TARTRATE-TIMOLOL 0.2-0.5 % OP SOLN
OPHTHALMIC | Status: DC | PRN
  Administered 2022-11-27: 1 [drp] via OPHTHALMIC

## 2022-11-27 MED ORDER — DEXMEDETOMIDINE HCL IN NACL 200 MCG/50ML IV SOLN
INTRAVENOUS | Status: DC | PRN
  Administered 2022-11-27 (×2): 10 ug via INTRAVENOUS

## 2022-11-27 SURGICAL SUPPLY — 9 items
CATARACT SUITE SIGHTPATH (MISCELLANEOUS) ×1 IMPLANT
FEE CATARACT SUITE SIGHTPATH (MISCELLANEOUS) ×1 IMPLANT
GLOVE BIOGEL PI IND STRL 8 (GLOVE) ×1 IMPLANT
GLOVE SURG ENC TEXT LTX SZ8 (GLOVE) ×1 IMPLANT
LENS IOL TECNIS EYHANCE 20.0 (Intraocular Lens) IMPLANT
NDL FILTER BLUNT 18X1 1/2 (NEEDLE) ×1 IMPLANT
NEEDLE FILTER BLUNT 18X1 1/2 (NEEDLE) ×1 IMPLANT
SYR 3ML LL SCALE MARK (SYRINGE) ×1 IMPLANT
WATER STERILE IRR 250ML POUR (IV SOLUTION) ×1 IMPLANT

## 2022-11-27 NOTE — Anesthesia Postprocedure Evaluation (Signed)
Anesthesia Post Note  Patient: Ronald Lucas  Procedure(s) Performed: CATARACT EXTRACTION PHACO AND INTRAOCULAR LENS PLACEMENT (IOC) RIGHT 5.51 00:36.0 (Right)  Patient location during evaluation: PACU Anesthesia Type: MAC Level of consciousness: awake and alert Pain management: pain level controlled Vital Signs Assessment: post-procedure vital signs reviewed and stable Respiratory status: spontaneous breathing, nonlabored ventilation, respiratory function stable and patient connected to nasal cannula oxygen Cardiovascular status: stable and blood pressure returned to baseline Postop Assessment: no apparent nausea or vomiting Anesthetic complications: no   No notable events documented.   Last Vitals:  Vitals:   11/27/22 1336 11/27/22 1341  BP: 116/79 104/74  Pulse: 75 77  Resp: 15 12  Temp: (!) 36.2 C (!) 36.2 C  SpO2: 98% 96%    Last Pain:  Vitals:   11/27/22 1336  TempSrc:   PainSc: 0-No pain                 Arita Miss

## 2022-11-27 NOTE — H&P (Signed)
[facility]   Primary Care Physician:  Steele Sizer, MD Ophthalmologist: Dr. George Ina  Pre-Procedure History & Physical: HPI:  Ronald Lucas is a 62 y.o. male here for cataract surgery.   Past Medical History:  Diagnosis Date   ADHD    Allergy    Asthma    Bipolar 1 disorder ([place])    Depression    Hypertension    Panic attacks    Panic disorder    PTSD (post-traumatic stress disorder)     Past Surgical History:  Procedure Laterality Date   CARDIAC SURGERY     CATARACT EXTRACTION W/PHACO Left 10/30/2022   Procedure: CATARACT EXTRACTION PHACO AND INTRAOCULAR LENS PLACEMENT (IOC) LEFT  7.80  00:54.4;  Surgeon: Birder Robson, MD;  Location: [facility];  Service: Ophthalmology;  Laterality: Left;   COLONOSCOPY WITH PROPOFOL N/A 07/21/2020   Procedure: COLONOSCOPY WITH PROPOFOL;  Surgeon: Jonathon Bellows, MD;  Location: [facility] ENDOSCOPY;  Service: Gastroenterology;  Laterality: N/A;   ESOPHAGOGASTRODUODENOSCOPY (EGD) WITH PROPOFOL N/A 07/21/2020   Procedure: ESOPHAGOGASTRODUODENOSCOPY (EGD) WITH PROPOFOL;  Surgeon: Jonathon Bellows, MD;  Location: [facility] ENDOSCOPY;  Service: Gastroenterology;  Laterality: N/A;   FRACTURE SURGERY      Prior to Admission medications   Medication Sig Start Date End Date Taking? Authorizing Provider  diazepam (VALIUM) 5 MG tablet Take 5 mg by mouth 2 (two) times daily. 10/08/22  Yes [provider]  diphenhydrAMINE (BENADRYL) 25 mg capsule Take 25 mg by mouth every 6 (six) hours as needed.   Yes [provider]  lisinopril (ZESTRIL) 5 MG tablet Take 1 tablet (5 mg total) by mouth 2 (two) times daily. 10/15/22  Yes Sowles, Drue Stager, MD  omeprazole (PRILOSEC) 20 MG capsule Take 1 capsule (20 mg total) by mouth 2 (two) times daily before a meal. 01/04/22  Yes Jonathon Bellows, MD  albuterol (VENTOLIN HFA) 108 (90 Base) MCG/ACT inhaler Inhale 2 puffs into the lungs every 6 (six) hours as needed for wheezing or shortness of breath. 11/07/21    Bo Merino, FNP    Allergies as of 11/22/2022 - Review Complete 11/22/2022  Allergen Reaction Noted   Farxiga [dapagliflozin] Shortness Of Breath and Itching 01/25/2021   Peanut oil Anaphylaxis 07/19/2017   Peanut-containing drug products Anaphylaxis 07/19/2017   Penicillins Hives 05/28/2011   Tetanus toxoids  05/28/2011   Buspar [buspirone] Other (See Comments) 06/14/2015   Flexeril [cyclobenzaprine] Other (See Comments), Nausea Only, and Nausea And Vomiting 07/04/2015   Gabapentin Nausea And Vomiting 07/19/2017   Methocarbamol Nausea Only 07/19/2017   Morphine and related Other (See Comments) 06/14/2015   Norvasc [amlodipine besylate]  03/25/2019   Prednisone  06/14/2015   Xyzal [levocetirizine]  10/12/2020    Family History  Problem Relation Age of Onset   Colon cancer Sister     Social History   Socioeconomic History   Marital status: Single    Spouse name: Not on file   Number of children: 0   Years of education: Not on file   Highest education level: Not on file  Occupational History   Occupation: disabled  Tobacco Use   Smoking status: Every Day    Packs/day: 0.10    Types: Cigarettes    Start date: 03/17/1983   Smokeless tobacco: Never  Vaping Use   Vaping Use: Never used  Substance and Sexual Activity   Alcohol use: No   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   Sexual activity: Not  Currently  Other Topics Concern   Not on file  Social History Narrative   Not on file   Social Determinants of Health   Financial Resource Strain: Low Risk  (03/09/2019)   Overall Financial Resource Strain (CARDIA)    Difficulty of Paying Living Expenses: Not hard at all  Food Insecurity: No Food Insecurity (03/09/2019)   Hunger Vital Sign    Worried About Running Out of Food in the Last Year: Never true    Ran Out of Food in the Last Year: Never true  Transportation Needs: No Transportation Needs (03/09/2019)   PRAPARE - Radiographer, therapeutic (Medical): No    Lack of Transportation (Non-Medical): No  Physical Activity: Inactive (03/09/2019)   Exercise Vital Sign    Days of Exercise per Week: 0 days    Minutes of Exercise per Session: 0 min  Stress: No Stress Concern Present (03/09/2019)   [place]    Feeling of Stress : Only a little  Social Connections: Moderately Isolated (03/09/2019)   Social Connection and Isolation Panel [NHANES]    Frequency of Communication with Friends and Family: Twice a week    Frequency of Social Gatherings with Friends and Family: Twice a week    Attends Religious Services: Never    Marine scientist or Organizations: No    Attends Archivist Meetings: Never    Marital Status: Never married  Intimate Partner Violence: Not At Risk (03/09/2019)   Humiliation, Afraid, Rape, and Kick questionnaire    Fear of Current or Ex-Partner: No    Emotionally Abused: No    Physically Abused: No    Sexually Abused: No    Review of Systems: See HPI, otherwise negative ROS  Physical Exam: BP 136/87   Temp (!) 97.3 F (36.3 C) (Tympanic)   Resp 15   Ht 6' 0.01" (1.829 m)   Wt 123.1 kg   SpO2 97%   BMI 36.80 kg/m  General:   Alert, cooperative in NAD Head:  Normocephalic and atraumatic. Respiratory:  Normal work of breathing. Cardiovascular:  RRR  Impression/Plan: Ronald Lucas is here for cataract surgery.  Risks, benefits, limitations, and alternatives regarding cataract surgery have been reviewed with the patient.  Questions have been answered.  All parties agreeable.   Birder Robson, MD  11/27/2022, 1:07 PM

## 2022-11-27 NOTE — Discharge Instructions (Signed)

## 2022-11-27 NOTE — Transfer of Care (Signed)
Immediate Anesthesia Transfer of Care Note  Patient: Ronald Lucas  Procedure(s) Performed: CATARACT EXTRACTION PHACO AND INTRAOCULAR LENS PLACEMENT (IOC) RIGHT 5.51 00:36.0 (Right)  Patient Location: PACU  Anesthesia Type: No value filed.  Level of Consciousness: awake, alert  and patient cooperative  Airway and Oxygen Therapy: Patient Spontanous Breathing and Patient connected to supplemental oxygen  Post-op Assessment: Post-op Vital signs reviewed, Patient's Cardiovascular Status Stable, Respiratory Function Stable, Patent Airway and No signs of Nausea or vomiting  Post-op Vital Signs: Reviewed and stable  Complications: No notable events documented.
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
## 2022-11-27 NOTE — Op Note (Signed)
PREOPERATIVE DIAGNOSIS:  Nuclear sclerotic cataract of the right eye.   POSTOPERATIVE DIAGNOSIS:  Cataract   OPERATIVE PROCEDURE:ORPROCALL@   SURGEON:  Ronald Robson, MD.   ANESTHESIA:  Anesthesiologist: Arita Miss, MD CRNA: Tobie Poet, CRNA  1.      Managed anesthesia care. 2.      0.67m of Shugarcaine was instilled in the eye following the paracentesis.   COMPLICATIONS:  None.   TECHNIQUE:   Stop and chop   DESCRIPTION OF PROCEDURE:  The patient was examined and consented in the preoperative holding area where the aforementioned topical anesthesia was applied to the right eye and then brought back to the Operating Room where the right eye was prepped and draped in the usual sterile ophthalmic fashion and a lid speculum was placed. A paracentesis was created with the side port blade and the anterior chamber was filled with viscoelastic. A near clear corneal incision was performed with the steel keratome. A continuous curvilinear capsulorrhexis was performed with a cystotome followed by the capsulorrhexis forceps. Hydrodissection and hydrodelineation were carried out with BSS on a blunt cannula. The lens was removed in a stop and chop  technique and the remaining cortical material was removed with the irrigation-aspiration handpiece. The capsular bag was inflated with viscoelastic and the Technis [id number]  lens was placed in the capsular bag without complication. The remaining viscoelastic was removed from the eye with the irrigation-aspiration handpiece. The wounds were hydrated. The anterior chamber was flushed with BSS and the eye was inflated to physiologic pressure. 0.113mof Vigamox was placed in the anterior chamber. The wounds were found to be water tight. The eye was dressed with Combigan. The patient was given protective glasses to wear throughout the day and a shield with which to sleep tonight. The patient was also given drops with which to begin a drop regimen today and will  follow-up with me in one day. Implant Name Type Inv. Item Serial No. Manufacturer Lot No. LRB No. Used Action  LENS IOL TECNIS EYHANCE 20.0 - [id number] Lens LENS IOL TECNIS EYHANCE 20.0 [id number]  Right 1 Implanted   Procedure(s): CATARACT EXTRACTION PHACO AND INTRAOCULAR LENS PLACEMENT (IOC) RIGHT 5.51 00:36.0 (Right)  Electronically signed: WiBirder Lucas/27/2024 1:34 PM

## 2022-11-28 ENCOUNTER — Encounter: Payer: Self-pay | Admitting: Ophthalmology

## 2022-12-06 ENCOUNTER — Other Ambulatory Visit: Payer: Self-pay

## 2022-12-06 MED ORDER — OMEPRAZOLE 20 MG PO CPDR: 20.0000 mg | DELAYED_RELEASE_CAPSULE | Freq: Two times a day (BID) | ORAL | 0 refills | Status: DC

## 2022-12-21 ENCOUNTER — Other Ambulatory Visit: Payer: Medicaid Other

## 2023-01-07 ENCOUNTER — Ambulatory Visit
Admission: RE | Admit: 2023-01-07 | Discharge: 2023-01-07 | Disposition: A | Discharge status: Home / Self Care | Payer: Medicaid Other | Source: Ambulatory Visit | Attending: Orthopedic Surgery | Admitting: Orthopedic Surgery

## 2023-01-07 DIAGNOSIS — M545 Low back pain, unspecified: Secondary | ICD-10-CM

## 2023-01-08 ENCOUNTER — Telehealth: Payer: Self-pay | Admitting: Orthopedic Surgery

## 2023-01-08 NOTE — Telephone Encounter (Signed)
See other note. Will call patient to discuss.

## 2023-01-08 NOTE — Telephone Encounter (Signed)
Patient called in stating he was speaking with Nurse earlier about issues with care, see previous telephone message. Stated he lost signal in grocery store would like a call back.

## 2023-01-08 NOTE — Telephone Encounter (Signed)
Tried calling patient to discuss. Someone picked up but never answered. Will try to reach patient again at a later time.

## 2023-01-08 NOTE — Telephone Encounter (Signed)
I called the patient and advised him of Dr. Kathi Der message. As I was orginially trying to explain to the patient the message, he was distracted by checking out at the grocery store, and I had to wait for him to finish paying before I had to re-explain the message to him. He states that he is having increased pain, and that he wants something for pain, but the pain management office denied him and they will not tell him why he was denied and he says they told him to call us and ask why he was denied, I advised that I have no reason as to why he was denied. I did try to offer him an appointment with either Dr. Ophelia Charter or Dr. Christell Constant, He states that he is not coming in unless he can be told for sure what is going on with him, that now it effecting his sleep, and daily activities. I advised that he had the MRI done yesterday and that it would tell us more, he states that Dr. Christell Constant never looked at the one Dr. Ophelia Charter done last year (there is not one ordered by Dr. Ophelia Charter in the chart). I did advise him that the new MRI has not been read by Radiology yet, however since he had a new fall that we should check him out and he then proceeded about getting x-rays when he saw Dr. Christell Constant and that it was a waste because x-rays don't show anything, I advised that they do show him more than people realize and that he uses them to get measurements by, at this point I put him hold as Judeth Cornfield and Nate from PT where in my office, I placed him on Hold - the light up to confirm the hold, and then he was off the line in less than 10 seconds. From here I went to speak with Lauren about this and she will call him.

## 2023-01-08 NOTE — Telephone Encounter (Signed)
Patient is requesting a call about why he not getting assistance with his pain please advise. He is very upset and is confused about the care he is wondering why Dr Christell Constant is not doing the things Dr Ophelia Charter is doing . I explained that all drs have their own methods and they have a process. He is concerned about his pain he just fell not too long ago.

## 2023-01-09 NOTE — Telephone Encounter (Signed)
Tried calling patient to discuss. Someone picked up but never answered. Will try to reach patient again at a later time.  

## 2023-01-22 DIAGNOSIS — F410 Panic disorder [episodic paroxysmal anxiety] without agoraphobia: Secondary | ICD-10-CM | POA: Diagnosis not present

## 2023-01-30 DIAGNOSIS — Z419 Encounter for procedure for purposes other than remedying health state, unspecified: Secondary | ICD-10-CM | POA: Diagnosis not present

## 2023-02-14 ENCOUNTER — Ambulatory Visit: Payer: Medicaid Other | Admitting: Family Medicine

## 2023-02-19 DIAGNOSIS — M545 Low back pain, unspecified: Secondary | ICD-10-CM | POA: Diagnosis not present

## 2023-02-19 DIAGNOSIS — G8929 Other chronic pain: Secondary | ICD-10-CM | POA: Diagnosis not present

## 2023-02-20 ENCOUNTER — Other Ambulatory Visit: Payer: Self-pay | Admitting: Gastroenterology

## 2023-03-02 DIAGNOSIS — Z419 Encounter for procedure for purposes other than remedying health state, unspecified: Secondary | ICD-10-CM | POA: Diagnosis not present

## 2023-03-20 ENCOUNTER — Other Ambulatory Visit: Payer: Self-pay | Admitting: Nurse Practitioner

## 2023-03-20 DIAGNOSIS — J4489 Other specified chronic obstructive pulmonary disease: Secondary | ICD-10-CM

## 2023-03-20 NOTE — Telephone Encounter (Signed)
Requested medication (s) are due for refill today - expired Rx  Requested medication (s) are on the active medication list -yes  Future visit scheduled -no  Last refill: 11/07/22 1each 3RF  Notes to clinic: expired Rx  Requested Prescriptions  Pending Prescriptions Disp Refills   VENTOLIN HFA 108 (90 Base) MCG/ACT inhaler [Pharmacy Med Name: Ventolin HFA 108 (90 Base) MCG/ACT Inhalation Aerosol Solution] 18 g 0    Sig: INHALE 2 PUFFS BY MOUTH EVERY 6 HOURS AS NEEDED FOR WHEEZING FOR SHORTNESS OF BREATH     Pulmonology:  Beta Agonists 2 Passed - 03/20/2023 11:36 AM      Passed - Last BP in normal range    BP Readings from Last 1 Encounters:  11/27/22 104/74         Passed - Last Heart Rate in normal range    Pulse Readings from Last 1 Encounters:  11/27/22 77         Passed - Valid encounter within last 12 months    Recent Outpatient Visits           5 months ago Dyslipidemia associated with type 2 diabetes mellitus (HCC)   [place] [facility] Alba Cory, MD   1 year ago Nausea   [facility] Berniece Salines, FNP   1 year ago COPD with asthma [facility])   [place] [facility] Berniece Salines, FNP   1 year ago Subacute maxillary sinusitis   [facility] Alba Cory, MD   2 years ago Dyslipidemia associated with type 2 diabetes mellitus [facility])   [place] [facility] Alba Cory, MD                 Requested Prescriptions  Pending Prescriptions Disp Refills   VENTOLIN HFA 108 (90 Base) MCG/ACT inhaler [Pharmacy Med Name: Ventolin HFA 108 (90 Base) MCG/ACT Inhalation Aerosol Solution] 18 g 0    Sig: INHALE 2 PUFFS BY MOUTH EVERY 6 HOURS AS NEEDED FOR WHEEZING FOR SHORTNESS OF BREATH     Pulmonology:  Beta Agonists 2 Passed - 03/20/2023 11:36 AM      Passed - Last BP in normal range    BP Readings from Last 1 Encounters:  11/27/22 104/74         Passed  - Last Heart Rate in normal range    Pulse Readings from Last 1 Encounters:  11/27/22 77         Passed - Valid encounter within last 12 months    Recent Outpatient Visits           5 months ago Dyslipidemia associated with type 2 diabetes mellitus [facility])   [place] [facility] Alba Cory, MD   1 year ago Nausea   [facility] Berniece Salines, FNP   1 year ago COPD with asthma [facility])   [place] [facility] Berniece Salines, FNP   1 year ago Subacute maxillary sinusitis   [facility] Alba Cory, MD   2 years ago Dyslipidemia associated with type 2 diabetes mellitus [facility])   [facility] Alba Cory, MD

## 2023-03-21 ENCOUNTER — Other Ambulatory Visit: Payer: Self-pay | Admitting: Nurse Practitioner

## 2023-03-21 ENCOUNTER — Other Ambulatory Visit: Payer: Self-pay | Admitting: Family Medicine

## 2023-03-21 DIAGNOSIS — J4489 Other specified chronic obstructive pulmonary disease: Secondary | ICD-10-CM

## 2023-03-21 DIAGNOSIS — I10 Essential (primary) hypertension: Secondary | ICD-10-CM

## 2023-03-22 NOTE — Telephone Encounter (Signed)
Requested medication (s) are due for refill today:   Rx is expired  Requested medication (s) are on the active medication list:   Yes  Future visit scheduled:   No   Has an appt to establish care with Dr. Clydie Braun on 05/28/2023   Last ordered: 11/07/2022 1 each, 3 refills  Returned because rx is expired   Requested Prescriptions  Pending Prescriptions Disp Refills   VENTOLIN HFA 108 (90 Base) MCG/ACT inhaler [Pharmacy Med Name: Ventolin HFA 108 (90 Base) MCG/ACT Inhalation Aerosol Solution] 18 g 0    Sig: INHALE 2 PUFFS BY MOUTH EVERY 6 HOURS AS NEEDED FOR WHEEZING FOR SHORTNESS OF BREATH     Pulmonology:  Beta Agonists 2 Passed - 03/21/2023  4:59 PM      Passed - Last BP in normal range    BP Readings from Last 1 Encounters:  11/27/22 104/74         Passed - Last Heart Rate in normal range    Pulse Readings from Last 1 Encounters:  11/27/22 77         Passed - Valid encounter within last 12 months    Recent Outpatient Visits           5 months ago Dyslipidemia associated with type 2 diabetes mellitus [facility])   [place] Alba Cory, MD   1 year ago Nausea   [facility] Berniece Salines, FNP   1 year ago COPD with asthma [facility])   [facility] Berniece Salines, FNP   1 year ago Subacute maxillary sinusitis   [facility] Alba Cory, MD   2 years ago Dyslipidemia associated with type 2 diabetes mellitus [facility])   [facility] Alba Cory, MD

## 2023-03-25 ENCOUNTER — Other Ambulatory Visit: Payer: Self-pay | Admitting: Emergency Medicine

## 2023-03-25 DIAGNOSIS — J4489 Other specified chronic obstructive pulmonary disease: Secondary | ICD-10-CM

## 2023-03-25 NOTE — Telephone Encounter (Signed)
Pt states he is not coming in, and he will go to [facility] when he feels like it, and hung up the phone

## 2023-03-25 NOTE — Telephone Encounter (Signed)
Pt states he doesn't understand why he needs an appt. He doesn't feel good and doesn't want to come into the office. Please confirm If he can do a virtual??

## 2023-04-01 DIAGNOSIS — Z419 Encounter for procedure for purposes other than remedying health state, unspecified: Secondary | ICD-10-CM | POA: Diagnosis not present

## 2023-04-23 DIAGNOSIS — F410 Panic disorder [episodic paroxysmal anxiety] without agoraphobia: Secondary | ICD-10-CM | POA: Diagnosis not present

## 2023-05-02 DIAGNOSIS — Z419 Encounter for procedure for purposes other than remedying health state, unspecified: Secondary | ICD-10-CM | POA: Diagnosis not present

## 2023-05-20 ENCOUNTER — Other Ambulatory Visit: Payer: Self-pay | Admitting: Gastroenterology

## 2023-05-28 DIAGNOSIS — Z789 Other specified health status: Secondary | ICD-10-CM | POA: Diagnosis not present

## 2023-05-28 DIAGNOSIS — R972 Elevated prostate specific antigen [PSA]: Secondary | ICD-10-CM | POA: Diagnosis not present

## 2023-05-28 DIAGNOSIS — F4310 Post-traumatic stress disorder, unspecified: Secondary | ICD-10-CM | POA: Diagnosis not present

## 2023-05-28 DIAGNOSIS — E785 Hyperlipidemia, unspecified: Secondary | ICD-10-CM | POA: Diagnosis not present

## 2023-05-28 DIAGNOSIS — I10 Essential (primary) hypertension: Secondary | ICD-10-CM | POA: Diagnosis not present

## 2023-05-28 DIAGNOSIS — E1165 Type 2 diabetes mellitus with hyperglycemia: Secondary | ICD-10-CM | POA: Diagnosis not present

## 2023-05-28 DIAGNOSIS — G894 Chronic pain syndrome: Secondary | ICD-10-CM | POA: Diagnosis not present

## 2023-05-28 DIAGNOSIS — G629 Polyneuropathy, unspecified: Secondary | ICD-10-CM | POA: Diagnosis not present

## 2023-05-28 DIAGNOSIS — F3160 Bipolar disorder, current episode mixed, unspecified: Secondary | ICD-10-CM | POA: Diagnosis not present

## 2023-05-28 DIAGNOSIS — E1169 Type 2 diabetes mellitus with other specified complication: Secondary | ICD-10-CM | POA: Diagnosis not present

## 2023-05-29 ENCOUNTER — Other Ambulatory Visit: Payer: Self-pay | Admitting: Family Medicine

## 2023-05-29 DIAGNOSIS — I10 Essential (primary) hypertension: Secondary | ICD-10-CM

## 2023-05-30 DIAGNOSIS — R768 Other specified abnormal immunological findings in serum: Secondary | ICD-10-CM | POA: Diagnosis not present

## 2023-06-02 DIAGNOSIS — Z419 Encounter for procedure for purposes other than remedying health state, unspecified: Secondary | ICD-10-CM | POA: Diagnosis not present

## 2023-06-19 ENCOUNTER — Telehealth: Payer: Self-pay | Admitting: *Deleted

## 2023-06-19 NOTE — Patient Outreach (Signed)
Care Management   Note  06/19/2023 Name: Ronald Lucas MRN: [id number] DOB: [birth year]  [name] is enrolled in a Managed Medicaid plan: No. Outreach attempt today was successful.   RNCM calling to provide [name] with information regarding case management provided by [facility]. [name] shared with RNCM that he is no longer with the [facility] Managed Medicaid plan.  No further follow up required:    Estanislado Emms RN, BSN [place]  [facility] RN Care Coordinator [phone]

## 2023-07-02 DIAGNOSIS — Z419 Encounter for procedure for purposes other than remedying health state, unspecified: Secondary | ICD-10-CM | POA: Diagnosis not present

## 2023-07-02 DIAGNOSIS — E538 Deficiency of other specified B group vitamins: Secondary | ICD-10-CM | POA: Diagnosis not present

## 2023-07-02 DIAGNOSIS — I10 Essential (primary) hypertension: Secondary | ICD-10-CM | POA: Diagnosis not present

## 2023-07-02 DIAGNOSIS — Z2821 Immunization not carried out because of patient refusal: Secondary | ICD-10-CM | POA: Diagnosis not present

## 2023-07-15 DIAGNOSIS — F410 Panic disorder [episodic paroxysmal anxiety] without agoraphobia: Secondary | ICD-10-CM | POA: Diagnosis not present

## 2023-08-02 DIAGNOSIS — Z419 Encounter for procedure for purposes other than remedying health state, unspecified: Secondary | ICD-10-CM | POA: Diagnosis not present

## 2023-08-14 DIAGNOSIS — G609 Hereditary and idiopathic neuropathy, unspecified: Secondary | ICD-10-CM | POA: Diagnosis not present

## 2023-08-14 DIAGNOSIS — R202 Paresthesia of skin: Secondary | ICD-10-CM | POA: Diagnosis not present

## 2023-08-14 DIAGNOSIS — R519 Headache, unspecified: Secondary | ICD-10-CM | POA: Diagnosis not present

## 2023-08-14 DIAGNOSIS — G8929 Other chronic pain: Secondary | ICD-10-CM | POA: Diagnosis not present

## 2023-08-14 DIAGNOSIS — M545 Low back pain, unspecified: Secondary | ICD-10-CM | POA: Diagnosis not present

## 2023-08-14 DIAGNOSIS — M62838 Other muscle spasm: Secondary | ICD-10-CM | POA: Diagnosis not present

## 2023-08-14 DIAGNOSIS — R200 Anesthesia of skin: Secondary | ICD-10-CM | POA: Diagnosis not present

## 2023-08-14 DIAGNOSIS — R296 Repeated falls: Secondary | ICD-10-CM | POA: Diagnosis not present

## 2023-08-14 DIAGNOSIS — R5383 Other fatigue: Secondary | ICD-10-CM | POA: Diagnosis not present

## 2023-08-20 ENCOUNTER — Other Ambulatory Visit: Payer: Self-pay | Admitting: Neurology

## 2023-08-20 DIAGNOSIS — R519 Headache, unspecified: Secondary | ICD-10-CM

## 2023-08-20 DIAGNOSIS — R200 Anesthesia of skin: Secondary | ICD-10-CM

## 2023-08-20 DIAGNOSIS — R42 Dizziness and giddiness: Secondary | ICD-10-CM

## 2023-08-22 ENCOUNTER — Other Ambulatory Visit: Payer: Self-pay | Admitting: Neurology

## 2023-08-22 DIAGNOSIS — M542 Cervicalgia: Secondary | ICD-10-CM

## 2023-09-01 DIAGNOSIS — Z419 Encounter for procedure for purposes other than remedying health state, unspecified: Secondary | ICD-10-CM | POA: Diagnosis not present

## 2023-09-05 ENCOUNTER — Encounter: Payer: Self-pay | Admitting: Neurology

## 2023-09-11 ENCOUNTER — Ambulatory Visit
Admission: RE | Admit: 2023-09-11 | Discharge: 2023-09-11 | Disposition: A | Discharge status: Home / Self Care | Payer: Medicaid Other | Source: Ambulatory Visit | Attending: Neurology | Admitting: Neurology

## 2023-09-11 DIAGNOSIS — R202 Paresthesia of skin: Secondary | ICD-10-CM | POA: Diagnosis not present

## 2023-09-11 DIAGNOSIS — M4802 Spinal stenosis, cervical region: Secondary | ICD-10-CM | POA: Diagnosis not present

## 2023-09-11 DIAGNOSIS — R42 Dizziness and giddiness: Secondary | ICD-10-CM

## 2023-09-11 DIAGNOSIS — M542 Cervicalgia: Secondary | ICD-10-CM | POA: Diagnosis not present

## 2023-09-11 DIAGNOSIS — R200 Anesthesia of skin: Secondary | ICD-10-CM

## 2023-09-11 DIAGNOSIS — R519 Headache, unspecified: Secondary | ICD-10-CM | POA: Diagnosis not present

## 2023-10-02 DIAGNOSIS — Z419 Encounter for procedure for purposes other than remedying health state, unspecified: Secondary | ICD-10-CM | POA: Diagnosis not present

## 2023-10-10 DIAGNOSIS — F410 Panic disorder [episodic paroxysmal anxiety] without agoraphobia: Secondary | ICD-10-CM | POA: Diagnosis not present

## 2023-11-02 DIAGNOSIS — Z419 Encounter for procedure for purposes other than remedying health state, unspecified: Secondary | ICD-10-CM | POA: Diagnosis not present

## 2023-11-30 DIAGNOSIS — Z419 Encounter for procedure for purposes other than remedying health state, unspecified: Secondary | ICD-10-CM | POA: Diagnosis not present

## 2023-12-02 DIAGNOSIS — Z789 Other specified health status: Secondary | ICD-10-CM | POA: Diagnosis not present

## 2023-12-02 DIAGNOSIS — G629 Polyneuropathy, unspecified: Secondary | ICD-10-CM | POA: Diagnosis not present

## 2023-12-02 DIAGNOSIS — E785 Hyperlipidemia, unspecified: Secondary | ICD-10-CM | POA: Diagnosis not present

## 2023-12-02 DIAGNOSIS — F4310 Post-traumatic stress disorder, unspecified: Secondary | ICD-10-CM | POA: Diagnosis not present

## 2023-12-02 DIAGNOSIS — R051 Acute cough: Secondary | ICD-10-CM | POA: Diagnosis not present

## 2023-12-02 DIAGNOSIS — I10 Essential (primary) hypertension: Secondary | ICD-10-CM | POA: Diagnosis not present

## 2023-12-02 DIAGNOSIS — E1169 Type 2 diabetes mellitus with other specified complication: Secondary | ICD-10-CM | POA: Diagnosis not present

## 2023-12-02 DIAGNOSIS — Z03818 Encounter for observation for suspected exposure to other biological agents ruled out: Secondary | ICD-10-CM | POA: Diagnosis not present

## 2023-12-02 DIAGNOSIS — E1165 Type 2 diabetes mellitus with hyperglycemia: Secondary | ICD-10-CM | POA: Diagnosis not present

## 2023-12-02 DIAGNOSIS — E538 Deficiency of other specified B group vitamins: Secondary | ICD-10-CM | POA: Diagnosis not present

## 2023-12-02 DIAGNOSIS — G894 Chronic pain syndrome: Secondary | ICD-10-CM | POA: Diagnosis not present

## 2023-12-02 DIAGNOSIS — F3160 Bipolar disorder, current episode mixed, unspecified: Secondary | ICD-10-CM | POA: Diagnosis not present

## 2024-01-07 DIAGNOSIS — F410 Panic disorder [episodic paroxysmal anxiety] without agoraphobia: Secondary | ICD-10-CM | POA: Diagnosis not present

## 2024-01-11 DIAGNOSIS — Z419 Encounter for procedure for purposes other than remedying health state, unspecified: Secondary | ICD-10-CM | POA: Diagnosis not present

## 2024-01-28 ENCOUNTER — Encounter: Admitting: Physician Assistant

## 2024-02-10 DIAGNOSIS — Z419 Encounter for procedure for purposes other than remedying health state, unspecified: Secondary | ICD-10-CM | POA: Diagnosis not present

## 2024-02-11 ENCOUNTER — Ambulatory Visit: Admitting: Internal Medicine

## 2024-03-02 ENCOUNTER — Other Ambulatory Visit: Payer: Self-pay | Admitting: Family Medicine

## 2024-03-02 ENCOUNTER — Ambulatory Visit: Admitting: Internal Medicine

## 2024-03-02 ENCOUNTER — Other Ambulatory Visit: Payer: Self-pay

## 2024-03-02 VITALS — BP 130/80 | HR 94 | Temp 98.1°F | Resp 18 | Ht 72.0 in | Wt 290.5 lb

## 2024-03-02 DIAGNOSIS — J4489 Other specified chronic obstructive pulmonary disease: Secondary | ICD-10-CM

## 2024-03-02 DIAGNOSIS — E538 Deficiency of other specified B group vitamins: Secondary | ICD-10-CM

## 2024-03-02 DIAGNOSIS — I10 Essential (primary) hypertension: Secondary | ICD-10-CM | POA: Diagnosis not present

## 2024-03-02 DIAGNOSIS — R252 Cramp and spasm: Secondary | ICD-10-CM | POA: Diagnosis not present

## 2024-03-02 MED ORDER — LISINOPRIL 5 MG PO TABS: 5.0000 mg | ORAL_TABLET | Freq: Two times a day (BID) | ORAL | 1 refills | Status: DC

## 2024-03-02 MED ORDER — CYANOCOBALAMIN 1000 MCG/ML IJ SOLN
1000.0000 ug | Freq: Once | INTRAMUSCULAR | Status: AC
Start: 2024-03-02 — End: 2024-03-02
  Administered 2024-03-02: 1000 ug via INTRAMUSCULAR

## 2024-03-02 NOTE — Progress Notes (Addendum)
 Acute Office Visit  Subjective:     Patient ID: Ronald Lucas, male    DOB: 12-27-1960, 63 y.o.   MRN: [id number]  Chief Complaint  Patient presents with   Back Pain   Neck Pain    Terrible muscle spasm    Back Pain  Neck Pain    Patient is in today for muscle spasms. This is the first time I am seeing him.   Discussed the use of AI scribe software for clinical note transcription with the patient, who gave verbal consent to proceed.  History of Present Illness Ronald Lucas is a 63 year old male with hypertension who presents with back spasms and numbness.  He experiences severe back spasms for several months, described as 'terrible', waking him at night, causing significant pain, and requiring him to force his leg to straighten. The spasms resemble 'charley horse cramps' that swell into a big knot, leaving him feeling as if he has been 'beaten with a baseball bat' the next day. Tylenol  and muscle relaxers have been ineffective. He has allergies to Flexeril  and methocarbamol, with a history of severe allergic reactions. Also noted delusions/hallucinations after taking Baclofen.   Numbness is present in both thighs and his arm, described as a 'weird sensation'. An MRI of the brain in December did not show signs of multiple sclerosis. He previously saw a neurologist who prescribed an antipsychotic for the cramps, which caused a bad reaction.  He has hypertension and previously had an issue with his blood pressure medication not being refilled. He reports high blood sugar levels, attributing it to inactivity due to pain. He had a severe reaction to Farxiga . He reports a low vitamin B12 level, with itching and redness as side effects from injections.  He has difficulty focusing during conversations, impacting his overall health maintenance and physical activity due to pain. No over-the-counter medications are currently being taken for back pain.    Review of Systems   Musculoskeletal:  Positive for back pain and neck pain.        Objective:    BP 130/80 (Cuff Size: Large)   Pulse 94   Temp 98.1 F (36.7 C) (Oral)   Resp 18   Ht 6' (1.829 m)   Wt 290 lb 8 oz (131.8 kg)   SpO2 98%   BMI 39.40 kg/m  BP Readings from Last 3 Encounters:  03/02/24 130/80  11/27/22 104/74  10/30/22 137/85   Wt Readings from Last 3 Encounters:  03/02/24 290 lb 8 oz (131.8 kg)  11/27/22 271 lb 6.4 oz (123.1 kg)  10/30/22 271 lb (122.9 kg)      Physical Exam Constitutional:      Appearance: Normal appearance.  HENT:     Head: Normocephalic and atraumatic.  Eyes:     Conjunctiva/sclera: Conjunctivae normal.  Cardiovascular:     Rate and Rhythm: Normal rate and regular rhythm.  Pulmonary:     Effort: Pulmonary effort is normal.     Breath sounds: Normal breath sounds.  Neurological:     Mental Status: He is alert. Mental status is at baseline.  Psychiatric:        Mood and Affect: Mood normal.        Behavior: Behavior normal.     No results found for any visits on 03/02/24.      Assessment & Plan:   Assessment & Plan Muscle spasms and cramps Chronic muscle spasms and cramps in back and legs causing pain and  sleep disruption. Previous muscle relaxants ineffective or caused adverse reactions. MRI ruled out multiple sclerosis. Possible vitamin B12 deficiency contribution. - Patient asking for Soma muscle relaxer and was told I do no prescribe this medication. He has allergies listed to several muscle relaxers, including Methocarbamol, Flexeril  and Baclofen causing psychiatric disturbances. I offered Zanaflex  which he says does not work for him. - Order labs to assess for calcium  deficiency and metabolic issues. - Administer vitamin B12 injection. - Advise diphenhydramine  for B12 injection reaction.  Vitamin B12 deficiency Confirmed deficiency contributing to neurological symptoms. Itching and redness reported with injections. - Administer  vitamin B12 injection. - Advise diphenhydramine  for injection reaction.  Hypertension Hypertension requiring medication management.  - Refill antihypertensive medication.  - Basic Metabolic Panel (BMET) - Magnesium - cyanocobalamin  (VITAMIN B12) injection 1,000 mcg - lisinopril  (ZESTRIL ) 5 MG tablet; Take 1 tablet (5 mg total) by mouth 2 (two) times daily.  Dispense: 180 tablet; Refill: 1  Of note, patient was complaining about his care here, both today and from previous visits. He does not want to return to his PCP and I will not be seeing him going forward due to general non-compliance and compromised medical relationship.    Return if symptoms worsen or fail to improve.  Rockney Cid, DO

## 2024-03-02 NOTE — Telephone Encounter (Signed)
 Copied from CRM [id number]. Topic: Clinical - Medication Refill >> Mar 02, 2024  4:37 PM Bridgette Campus T wrote: Medication: albuterol  (VENTOLIN  HFA) 108 (90 Base) MCG/ACT inhaler  Has the patient contacted their pharmacy? No (Agent: If no, request that the patient contact the pharmacy for the refill. If patient does not wish to contact the pharmacy document the reason why and proceed with request.) (Agent: If yes, when and what did the pharmacy advise?)  This is the patient's preferred pharmacy:  [facility] [address] [place] - [address] [place] [address] Phone: [phone] Fax: [phone]  Is this the correct pharmacy for this prescription? Yes If no, delete pharmacy and type the correct one.   Has the prescription been filled recently? Yes  Is the patient out of the medication? Yes  Has the patient been seen for an appointment in the last year OR does the patient have an upcoming appointment? Yes  Can we respond through MyChart? Yes  Agent: Please be advised that Rx refills may take up to 3 business days. We ask that you follow-up with your pharmacy.

## 2024-03-03 ENCOUNTER — Ambulatory Visit: Payer: Self-pay | Admitting: Internal Medicine

## 2024-03-03 LAB — BASIC METABOLIC PANEL WITH GFR
BUN: 13 mg/dL (ref 7–25)
CO2: 23 mmol/L (ref 20–32)
Calcium: 9.5 mg/dL (ref 8.6–10.3)
Chloride: 102 mmol/L (ref 98–110)
Creat: 1.08 mg/dL (ref 0.70–1.35)
Glucose, Bld: 260 mg/dL — ABNORMAL HIGH (ref 65–99)
Potassium: 4.4 mmol/L (ref 3.5–5.3)
Sodium: 137 mmol/L (ref 135–146)
eGFR: 78 mL/min/{1.73_m2} (ref >=60)

## 2024-03-03 LAB — MAGNESIUM: Magnesium: 1.9 mg/dL (ref 1.5–2.5)

## 2024-03-03 NOTE — Telephone Encounter (Signed)
 Requested medication (s) are due for refill today: yes  Requested medication (s) are on the active medication list: yes  Last refill:  11/07/21 1 each 3 RF  Future visit scheduled: no  Notes to clinic:  order is from 2023   Requested Prescriptions  Pending Prescriptions Disp Refills   albuterol  (VENTOLIN  HFA) 108 (90 Base) MCG/ACT inhaler 1 each 3    Sig: Inhale 2 puffs into the lungs every 6 (six) hours as needed for wheezing or shortness of breath.     Pulmonology:  Beta Agonists 2 Failed - 03/03/2024  3:30 PM      Failed - Valid encounter within last 12 months    Recent Outpatient Visits           Yesterday Muscle cramps   [facility] Rockney Cid, [place]              Passed - Last BP in normal range    BP Readings from Last 1 Encounters:  03/02/24 130/80         Passed - Last Heart Rate in normal range    Pulse Readings from Last 1 Encounters:  03/02/24 94

## 2024-03-04 ENCOUNTER — Telehealth: Payer: Self-pay | Admitting: Family Medicine

## 2024-03-04 ENCOUNTER — Ambulatory Visit: Payer: Self-pay

## 2024-03-04 NOTE — Telephone Encounter (Signed)
albuterol (VENTOLIN HFA) 108 (90 Base) MCG/ACT inhaler ?

## 2024-03-04 NOTE — Telephone Encounter (Signed)
 Pt has not seen you since 10/2022

## 2024-03-04 NOTE — Telephone Encounter (Signed)
 Copied from CRM [id number]. Topic: Clinical - Red Word Triage >> Mar 04, 2024  4:08 PM Elle L wrote: Red Word that prompted transfer to Nurse Triage: The patient is having difficulty breathing and he did not get his albuterol  (VENTOLIN  HFA) 108 (90 Base) MCG/ACT inhaler prescription at his appointment on 6/2.

## 2024-03-04 NOTE — Telephone Encounter (Signed)
 Did he ask for albuterol  to be filled?

## 2024-03-04 NOTE — Telephone Encounter (Signed)
Duplicate encounter sent to PCP

## 2024-03-04 NOTE — Telephone Encounter (Signed)
 FYI Only or Action Required?: needs action  Patient was last seen in primary care on 03/02/2024 by Rockney Cid, DO. Called Nurse Triage reporting Shortness of Breath. Symptoms began today. Interventions attempted: OTC medications: tylenol  and Rest, hydration, or home remedies. Symptoms are: gradually worsening.  Triage Disposition: See HCP Within 4 Hours (Or PCP Triage)  Patient/caregiver understands and will follow disposition?: no        Copied from CRM [id number]. Topic: Clinical - Red Word Triage >> Mar 04, 2024  4:08 PM Elle L wrote: Red Word that prompted transfer to Nurse Triage: The patient is having difficulty breathing and he did not get his albuterol  (VENTOLIN  HFA) 108 (90 Base) MCG/ACT inhaler prescription at his appointment on 6/2. Reason for Disposition  [1] MILD difficulty breathing (e.g., minimal/no SOB at rest, SOB with walking, pulse <100) AND [2] NEW-onset or WORSE than normal  Answer Assessment - Initial Assessment Questions Pt reports mild, worsening difficulty breathing after mowing the lawn. Pt states he needs a refill of his albuterol  (Ventolin ) inhaler. Pt seen 6/2. Pt states the B12 injection he received in office was very helpful and he would like to know how he can continue to take these injections. Pt endorses severe lower back pain and spasms. Pt states his chart incorrectly states that he hallucinates when he takes muscle relaxers. Pt reports rash to his R arm, thinks he has MS.  RN advised UC for worsening SOB and back pain. Pt declined, stating he is in too much pain.  1. RESPIRATORY STATUS: "Describe your breathing?" (e.g., wheezing, shortness of breath, unable to speak, severe coughing)      Difficulty breathing after mowing the lawn yesterday; pt states he needs his albuterol  refilled, states he needs it and has called 4x; "phlegm builds up in my esophagus" 2. ONSET: "When did this breathing problem begin?"      COPD, asthma @ baseline, mowed  yesterday, now allergies/asthma is worse  3. PATTERN "Does the difficult breathing come and go, or has it been constant since it started?"      Comes and goes  4. SEVERITY: "How bad is your breathing?" (e.g., mild, moderate, severe)    - MILD: No SOB at rest, mild SOB with walking, speaks normally in sentences, can lie down, no retractions, pulse < 100.    - MODERATE: SOB at rest, SOB with minimal exertion and prefers to sit, cannot lie down flat, speaks in phrases, mild retractions, audible wheezing, pulse 100-120.    - SEVERE: Very SOB at rest, speaks in single words, struggling to breathe, sitting hunched forward, retractions, pulse > 120      "It's not terrible, but it hits me a lot of times late at night or early in the AM"; "phlegm builds up in my esophagus" 5. RECURRENT SYMPTOM: "Have you had difficulty breathing before?" If Yes, ask: "When was the last time?" and "What happened that time?"      Yes  6. CARDIAC HISTORY: "Do you have any history of heart disease?" (e.g., heart attack, angina, bypass surgery, angioplasty)      HTN 7. LUNG HISTORY: "Do you have any history of lung disease?"  (e.g., pulmonary embolus, asthma, emphysema)     COPD with asthma, bronchitis  8. CAUSE: "What do you think is causing the breathing problem?"      Mowed the lawn 9. OTHER SYMPTOMS: "Do you have any other symptoms? (e.g., dizziness, runny nose, cough, chest pain, fever)     Pt denies  CP. Pt seen 6/2 - states he was not given an albuterol  inhaler which he needs d/t asthma and difficulty breathing; pt endorses muscle spasms, states his chart incorrectly states he hallucinates when he takes muscle relaxers which he states is not true   Endorses lower back pain and spasms, hx of neuropathy. 50/10 pain. Taking 1500 mg tylenol  every day, only slightly helps  Pt given a B12 injection 6/2, states they work very well and he wants to continue taking these  Rash to his R arm; pt states he has symptoms of MS 10.  O2 SATURATION MONITOR:  "Do you use an oxygen saturation monitor (pulse oximeter) at home?" If Yes, ask: "What is your reading (oxygen level) today?" "What is your usual oxygen saturation reading?" (e.g., 95%)  Protocols used: Breathing Difficulty-A-AH

## 2024-03-05 ENCOUNTER — Other Ambulatory Visit: Payer: Self-pay | Admitting: Internal Medicine

## 2024-03-05 ENCOUNTER — Other Ambulatory Visit (HOSPITAL_COMMUNITY): Payer: Self-pay

## 2024-03-05 DIAGNOSIS — J4489 Other specified chronic obstructive pulmonary disease: Secondary | ICD-10-CM

## 2024-03-05 MED ORDER — ALBUTEROL SULFATE HFA 108 (90 BASE) MCG/ACT IN AERS
2.0000 | INHALATION_SPRAY | Freq: Four times a day (QID) | RESPIRATORY_TRACT | 3 refills | Status: AC | PRN
Start: 2024-03-05 — End: ?

## 2024-03-05 NOTE — Telephone Encounter (Signed)
 Pt notified about albuterol  verbalized understanding, advised if sx happen again needs to seek medical attention.

## 2024-03-12 DIAGNOSIS — Z419 Encounter for procedure for purposes other than remedying health state, unspecified: Secondary | ICD-10-CM | POA: Diagnosis not present

## 2024-03-31 DIAGNOSIS — F410 Panic disorder [episodic paroxysmal anxiety] without agoraphobia: Secondary | ICD-10-CM | POA: Diagnosis not present

## 2024-04-11 DIAGNOSIS — Z419 Encounter for procedure for purposes other than remedying health state, unspecified: Secondary | ICD-10-CM | POA: Diagnosis not present

## 2024-04-17 ENCOUNTER — Other Ambulatory Visit: Payer: Self-pay | Admitting: Gastroenterology

## 2024-04-17 MED ORDER — OMEPRAZOLE 20 MG PO CPDR: 20.0000 mg | DELAYED_RELEASE_CAPSULE | Freq: Two times a day (BID) | ORAL | 0 refills | Status: DC

## 2024-05-05 NOTE — Progress Notes (Unsigned)
 New patient visit  Patient: CHAU SAWIN   DOB: 23-Aug-1961   63 y.o. Male  MRN: [id number] Visit Date: 05/06/2024  Today's healthcare provider: Isaiah DELENA Pepper, MD   Chief Complaint  Patient presents with   Transitions Of Care    Back pain and spams that's on going, this has been going on for years, pain level 10       Care Management    Pattern of eating:general   Are you exercising:no      Vaccine: denied (stated he can't get them pneo)   Coloscopy was done back in 2022  Eye exam: done this year and had surgery,   Denied: foot exam     Subjective    [name] is a 63 y.o. male who presents today as a new patient to establish care.   Muscle spasms/cramps - Has been having muscle cramps and spasms for years - Recently, has been having spasms that radiate to the back of his head - Having spasms all over - Spasms present in calves, abdomen, neck, back - Requests pain medication- the only thing that has worked in the past is percocet and pain medicine received in the hospital  Neuropathy  - Has been going on for years - Can't walk without shoes on - Tips of fingers are numb and feet are numb - Reports pins and needles in hands - Reports sharp pain in feet - Both thighs are numb   GERD - Has been ongoing for years - PPI has been helpful  Allergies - Taking benadryl  daily - Cannot take zyrtec, allegra, or claritin due to side effect of headaches  B12 deficiency - B12 injection worked well in the past. Taking of B12 once a week currently.   HTN: Taking lisinopril  5mg  BID Panic disorder- taking diazepam  5mg  BID, last filled 7/22. Prescribed by Dr. Dozier in [facility].  Diabetes- not on any medication COPD with asthma- has never done spirometry  Had an anaphylactic reaction to Farxiga .  Lives with mom in [place].   Past Medical History:  Diagnosis Date   ADHD    Allergy    Asthma    Bipolar 1 disorder (HCC)    Depression     Hypertension    Panic attacks    Panic disorder    PTSD (post-traumatic stress disorder)    Past Surgical History:  Procedure Laterality Date   CARDIAC SURGERY     CATARACT EXTRACTION W/PHACO Left 10/30/2022   Procedure: CATARACT EXTRACTION PHACO AND INTRAOCULAR LENS PLACEMENT (IOC) LEFT  7.80  00:54.4;  Surgeon: Jaye Fallow, MD;  Location: [facility];  Service: Ophthalmology;  Laterality: Left;   CATARACT EXTRACTION W/PHACO Right 11/27/2022   Procedure: CATARACT EXTRACTION PHACO AND INTRAOCULAR LENS PLACEMENT (IOC) RIGHT 5.51 00:36.0;  Surgeon: Jaye Fallow, MD;  Location: [facility];  Service: Ophthalmology;  Laterality: Right;   COLONOSCOPY WITH PROPOFOL  N/A 07/21/2020   Procedure: COLONOSCOPY WITH PROPOFOL ;  Surgeon: Therisa Bi, MD;  Location: [facility] END[facility];  Service: Gastroenterology;  Laterality: N/A;   ESOPHAGOGASTRODUODENOSCOPY (EGD) WITH PROPOFOL  N/A 07/21/2020   Procedure: ESOPHAGOGASTRODUODENOSCOPY (EGD) WITH PROPOFOL ;  Surgeon: Therisa Bi, MD;  Location: [facility] ENDOSCOPY;  Service: Gastroenterology;  Laterality: N/A;   FRACTURE SURGERY     Family Status  Relation Name Status   Mother  Alive   Father  Deceased   Sister  Alive  No partnership data on file   Family History  Problem Relation Age of Onset  Colon cancer Sister    Social History   Socioeconomic History   Marital status: Single    Spouse name: Not on file   Number of children: 0   Years of education: Not on file   Highest education level: Associate degree: academic program  Occupational History   Occupation: disabled  Tobacco Use   Smoking status: Some Days    Types: Cigarettes   Smokeless tobacco: Never  Vaping Use   Vaping status: Never Used  Substance and Sexual Activity   Alcohol use: Yes    Comment: sometimes 2-3 a day when he drinks (beer)   Drug use: Yes    Types: Marijuana    Comment: he use marijuana min amount   Sexual activity: Not Currently  Other Topics  Concern   Not on file  Social History Narrative   Not on file   Social Drivers of Health   Financial Resource Strain: High Risk (03/02/2024)   Overall Financial Resource Strain (CARDIA)    Difficulty of Paying Living Expenses: Very hard  Food Insecurity: Food Insecurity Present (03/02/2024)   Hunger Vital Sign    Worried About Running Out of Food in the Last Year: Often true    Ran Out of Food in the Last Year: Often true  Transportation Needs: Unmet Transportation Needs (03/02/2024)   PRAPARE - Transportation    Lack of Transportation (Medical): Yes    Lack of Transportation (Non-Medical): Yes  Physical Activity: Unknown (03/02/2024)   Exercise Vital Sign    Days of Exercise per Week: 0 days    Minutes of Exercise per Session: Not on file  Stress: Stress Concern Present (03/02/2024)   [employer] of Occupational Health - Occupational Stress Questionnaire    Feeling of Stress : Very much  Social Connections: Socially Isolated (03/02/2024)   Social Connection and Isolation Panel    Frequency of Communication with Friends and Family: Never    Frequency of Social Gatherings with Friends and Family: Never    Attends Religious Services: Never    Database administrator or Organizations: No    Attends Engineer, structural: Not on file    Marital Status: Divorced   Outpatient Medications Prior to Visit  Medication Sig   albuterol  (VENTOLIN  HFA) 108 (90 Base) MCG/ACT inhaler Inhale 2 puffs into the lungs every 6 (six) hours as needed for wheezing or shortness of breath.   diazepam  (VALIUM ) 5 MG tablet Take 5 mg by mouth 2 (two) times daily.   diphenhydrAMINE  (BENADRYL ) 25 mg capsule Take 25 mg by mouth every 6 (six) hours as needed.   lisinopril  (ZESTRIL ) 5 MG tablet Take 1 tablet (5 mg total) by mouth 2 (two) times daily.   omeprazole  (PRILOSEC) 20 MG capsule Take 1 capsule (20 mg total) by mouth 2 (two) times daily before a meal.   No facility-administered medications prior to  visit.   Allergies  Allergen Reactions   Farxiga  [Dapagliflozin ] Shortness Of Breath and Itching   Peanut Oil Anaphylaxis   Peanut-Containing Drug Products Anaphylaxis   Penicillins Hives    Other reaction(s): Anaphylaxis Throat swelling   Tetanus Toxoids     Other reaction(s): Swelling   Buspar [Buspirone] Other (See Comments)    Motion sickness   Gabapentin  Nausea And Vomiting   Methocarbamol Nausea Only   Morphine And Codeine Other (See Comments)    Gets violent if takes Morphine    Norvasc  [Amlodipine  Besylate]     Sob, wheezing  Prednisone      Other reaction(s): Hallucination hallucinations hallucinations   Xyzal  [Levocetirizine]     Dry eyes    Reviews of Systems as noted in HPI.  Last CBC Lab Results  Component Value Date   WBC 8.2 08/09/2022   HGB 16.2 08/09/2022   HCT 45.4 08/09/2022   MCV 80.6 08/09/2022   MCH 28.8 08/09/2022   RDW 12.6 08/09/2022   PLT 215 08/09/2022   Last metabolic panel Lab Results  Component Value Date   GLUCOSE 260 (H) 03/02/2024   NA 137 03/02/2024   K 4.4 03/02/2024   CL 102 03/02/2024   CO2 23 03/02/2024   BUN 13 03/02/2024   CREATININE 1.08 03/02/2024   EGFR 78 03/02/2024   CALCIUM  9.5 03/02/2024   PROT 8.0 08/09/2022   ALBUMIN 4.6 08/09/2022   BILITOT 0.7 08/09/2022   ALKPHOS 64 08/09/2022   AST 17 08/09/2022   ALT 18 08/09/2022   ANIONGAP 14 08/09/2022   Last lipids Lab Results  Component Value Date   CHOL 209 (H) 04/05/2020   HDL 33 (L) 04/05/2020   LDLCALC 129 (H) 04/05/2020   TRIG 329 (H) 04/05/2020   CHOLHDL 6.3 (H) 04/05/2020   Last thyroid functions Lab Results  Component Value Date   TSH 2.30 03/09/2019    Objective    BP (!) 156/101   Pulse 83   Ht 6' (1.829 m)   Wt 286 lb (129.7 kg)   SpO2 95%   BMI 38.79 kg/m  BP Readings from Last 3 Encounters:  05/06/24 (!) 156/101  03/02/24 130/80  11/27/22 104/74   Wt Readings from Last 3 Encounters:  05/06/24 286 lb (129.7 kg)  03/02/24  290 lb 8 oz (131.8 kg)  11/27/22 271 lb 6.4 oz (123.1 kg)    Physical Exam Constitutional:      Appearance: Normal appearance.  HENT:     Head: Normocephalic and atraumatic.     Mouth/Throat:     Mouth: Mucous membranes are moist.  Eyes:     Pupils: Pupils are equal, round, and reactive to light.  Cardiovascular:     Rate and Rhythm: Normal rate and regular rhythm.     Heart sounds: Normal heart sounds.  Pulmonary:     Effort: Pulmonary effort is normal. No respiratory distress.     Breath sounds: Normal breath sounds.  Musculoskeletal:     Cervical back: Spasms and tenderness present.     Comments: Spasms and TTP at trapezius.  Skin:    General: Skin is warm.  Neurological:     General: No focal deficit present.     Mental Status: He is alert.    Depression Screen    10/15/2022    2:05 PM 11/07/2021   12:40 PM 08/07/2021    1:18 PM 06/12/2021   10:04 AM  PHQ 2/9 Scores  PHQ - 2 Score 3 0 0 0  PHQ- 9 Score 12 0     No results found for any visits on 05/06/24.  Assessment & Plan      Problem List Items Addressed This Visit       Cardiovascular and Mediastinum   Hypertension associated with diabetes (HCC) - Primary   Uncontrolled based on BP in clinic today, but previously well controlled. Currently taking lisinopril  5mg  BID. Discussed increasing lisinopril  but patient would like to defer for now. Of note, has not been able to tolerate ARB, amlodipine , or thiazide previously due to various allergic rxns.  - Continue  lisinopril  5mg  BID - Recommend patient get home BP cuff to take BP at home - Consider increasing lisinopril  at next appt  BP Readings from Last 3 Encounters:  05/06/24 (!) 156/101  03/02/24 130/80  11/27/22 104/74         Relevant Orders   Comprehensive metabolic panel with GFR     Digestive   Gastroesophageal reflux disease without esophagitis   Well controlled on PPI. Will continue omeprazole  20mg  BID.        Endocrine   Dyslipidemia  associated with type 2 diabetes mellitus (HCC)   Uncontrolled at this time. LDL previously elevated as below. Has not tolerated statins (Crestor ) previously due to muscle aches.  - Check lipid panel today - Consider starting Zetia given statin intolerance  Lab Results  Component Value Date   LDLCALC 129 (H) 04/05/2020         Relevant Orders   Lipid panel   Type 2 diabetes mellitus with diabetic polyneuropathy, without long-term current use of insulin (HCC)   Uncontrolled at this time. Last A1c of 8.4 on 12/02/23. Not taking any medications. Has been working on controlling with diet + exercise. Previous severe allergic reaction to Farxiga . Has refused GLP1 and metformin  in the past due to concern for side effects. Suspect that his worsening neuropathy is 2/2 uncontrolled diabetes, possible B12 deficiency. - Will check A1c today, consider starting glipizide - Will obtain UACr, GFR - follow up in 1 month for foot exam, eye exam, and medication titration      Relevant Medications   cyclobenzaprine  (FLEXERIL ) 5 MG tablet   Other Relevant Orders   Comprehensive metabolic panel with GFR   Hemoglobin A1c   Microalbumin / creatinine urine ratio     Other   Muscle spasm   Patient with hx of muscle spasms, neck and back pain for years. Uncontrolled at this time. On exam, muscle tension and spasm present at trapezius. Has tried tizanidine , methocarbamol, and Baclofen without improvement. Of note, patient is allergic to many medications including methocarbamol, gabapentin . Reports that he is NOT allergic to flexeril , and this has worked for him in the past. - Will start flexeril  5mg  TID PRN - Recommend gentle stretching at home, consider PT referral - Considered starting Cymbalta for chronic pain, although patient does have hx of bipolar dx. Unclear if he has had true manic episodes.  Will discuss with patient's psychiatrist. - Follow up in 1 month      Relevant Medications   cyclobenzaprine   (FLEXERIL ) 5 MG tablet   Vitamin B 12 deficiency   Borderline level at last appt in June. Received B12 injection at that time which helped with symptoms. Currently taking 1000mcg PO weekly (instead of daily).  - Will recheck B12 and CBC today, consider injections      Relevant Orders   CBC   Vitamin B12   Other Visit Diagnoses       Screening for colon cancer       Relevant Orders   Ambulatory referral to Gastroenterology      Counseled patient on importance of vaccines. He declines all vaccines today.  Return in about 4 weeks (around 06/03/2024) for Diabetes follow-up.      Isaiah DELENA Pepper, MD  [facility] [phone] (phone) [phone] (fax)

## 2024-05-05 NOTE — Progress Notes (Deleted)
 New patient visit  Patient: Ronald Lucas   DOB: 23-Aug-1961   63 y.o. Male  MRN: [id number] Visit Date: 05/06/2024  Today's healthcare provider: Isaiah DELENA Pepper, MD   Chief Complaint  Patient presents with   Transitions Of Care    Back pain and spams that's on going, this has been going on for years, pain level 10       Care Management    Pattern of eating:general   Are you exercising:no      Vaccine: denied (stated he can't get them pneo)   Coloscopy was done back in 2022  Eye exam: done this year and had surgery,   Denied: foot exam     Subjective    Ronald Lucas is a 63 y.o. male who presents today as a new patient to establish care.   Muscle spasms/cramps - Has been having muscle cramps and spasms for years - Recently, has been having spasms that radiate to the back of his head - Having spasms all over - Spasms present in calves, abdomen, neck, back - Requests pain medication- the only thing that has worked in the past is percocet and pain medicine received in the hospital  Neuropathy  - Has been going on for years - Can't walk without shoes on - Tips of fingers are numb and feet are numb - Reports pins and needles in hands - Reports sharp pain in feet - Both thighs are numb   GERD - Has been ongoing for years - PPI has been helpful  Allergies - Taking benadryl  daily - Cannot take zyrtec, allegra, or claritin due to side effect of headaches  B12 deficiency - B12 injection worked well in the past. Taking of B12 once a week currently.   HTN: Taking lisinopril  5mg  BID Panic disorder- taking diazepam  5mg  BID, last filled 7/22. Prescribed by Dr. Dozier in [facility].  Diabetes- not on any medication COPD with asthma- has never done spirometry  Had an anaphylactic reaction to Farxiga .  Lives with mom in [place].   Past Medical History:  Diagnosis Date   ADHD    Allergy    Asthma    Bipolar 1 disorder (HCC)    Depression     Hypertension    Panic attacks    Panic disorder    PTSD (post-traumatic stress disorder)    Past Surgical History:  Procedure Laterality Date   CARDIAC SURGERY     CATARACT EXTRACTION W/PHACO Left 10/30/2022   Procedure: CATARACT EXTRACTION PHACO AND INTRAOCULAR LENS PLACEMENT (IOC) LEFT  7.80  00:54.4;  Surgeon: Jaye Fallow, MD;  Location: [facility];  Service: Ophthalmology;  Laterality: Left;   CATARACT EXTRACTION W/PHACO Right 11/27/2022   Procedure: CATARACT EXTRACTION PHACO AND INTRAOCULAR LENS PLACEMENT (IOC) RIGHT 5.51 00:36.0;  Surgeon: Jaye Fallow, MD;  Location: [facility];  Service: Ophthalmology;  Laterality: Right;   COLONOSCOPY WITH PROPOFOL  N/A 07/21/2020   Procedure: COLONOSCOPY WITH PROPOFOL ;  Surgeon: Therisa Bi, MD;  Location: [facility] END[facility]COPY;  Service: Gastroenterology;  Laterality: N/A;   ESOPHAGOGASTRODUODENOSCOPY (EGD) WITH PROPOFOL  N/A 07/21/2020   Procedure: ESOPHAGOGASTRODUODENOSCOPY (EGD) WITH PROPOFOL ;  Surgeon: Therisa Bi, MD;  Location: [facility] ENDOSCOPY;  Service: Gastroenterology;  Laterality: N/A;   FRACTURE SURGERY     Family Status  Relation Name Status   Mother  Alive   Father  Deceased   Sister  Alive  No partnership data on file   Family History  Problem Relation Age of Onset  Colon cancer Sister    Social History   Socioeconomic History   Marital status: Single    Spouse name: Not on file   Number of children: 0   Years of education: Not on file   Highest education level: Associate degree: academic program  Occupational History   Occupation: disabled  Tobacco Use   Smoking status: Some Days    Types: Cigarettes   Smokeless tobacco: Never  Vaping Use   Vaping status: Never Used  Substance and Sexual Activity   Alcohol use: Yes    Comment: sometimes 2-3 a day when he drinks (beer)   Drug use: Yes    Types: Marijuana    Comment: he use marijuana min amount   Sexual activity: Not Currently  Other Topics  Concern   Not on file  Social History Narrative   Not on file   Social Drivers of Health   Financial Resource Strain: High Risk (03/02/2024)   Overall Financial Resource Strain (CARDIA)    Difficulty of Paying Living Expenses: Very hard  Food Insecurity: Food Insecurity Present (03/02/2024)   Hunger Vital Sign    Worried About Running Out of Food in the Last Year: Often true    Ran Out of Food in the Last Year: Often true  Transportation Needs: Unmet Transportation Needs (03/02/2024)   PRAPARE - Transportation    Lack of Transportation (Medical): Yes    Lack of Transportation (Non-Medical): Yes  Physical Activity: Unknown (03/02/2024)   Exercise Vital Sign    Days of Exercise per Week: 0 days    Minutes of Exercise per Session: Not on file  Stress: Stress Concern Present (03/02/2024)   [employer] of Occupational Health - Occupational Stress Questionnaire    Feeling of Stress : Very much  Social Connections: Socially Isolated (03/02/2024)   Social Connection and Isolation Panel    Frequency of Communication with Friends and Family: Never    Frequency of Social Gatherings with Friends and Family: Never    Attends Religious Services: Never    Database administrator or Organizations: No    Attends Engineer, structural: Not on file    Marital Status: Divorced   Outpatient Medications Prior to Visit  Medication Sig   albuterol  (VENTOLIN  HFA) 108 (90 Base) MCG/ACT inhaler Inhale 2 puffs into the lungs every 6 (six) hours as needed for wheezing or shortness of breath.   diazepam  (VALIUM ) 5 MG tablet Take 5 mg by mouth 2 (two) times daily.   diphenhydrAMINE  (BENADRYL ) 25 mg capsule Take 25 mg by mouth every 6 (six) hours as needed.   lisinopril  (ZESTRIL ) 5 MG tablet Take 1 tablet (5 mg total) by mouth 2 (two) times daily.   omeprazole  (PRILOSEC) 20 MG capsule Take 1 capsule (20 mg total) by mouth 2 (two) times daily before a meal.   No facility-administered medications prior to  visit.   Allergies  Allergen Reactions   Farxiga  [Dapagliflozin ] Shortness Of Breath and Itching   Peanut Oil Anaphylaxis   Peanut-Containing Drug Products Anaphylaxis   Penicillins Hives    Other reaction(s): Anaphylaxis Throat swelling   Tetanus Toxoids     Other reaction(s): Swelling   Buspar [Buspirone] Other (See Comments)    Motion sickness   Gabapentin  Nausea And Vomiting   Methocarbamol Nausea Only   Morphine And Codeine Other (See Comments)    Gets violent if takes Morphine    Norvasc  [Amlodipine  Besylate]     Sob, wheezing

## 2024-05-06 ENCOUNTER — Ambulatory Visit

## 2024-05-06 VITALS — BP 156/101 | HR 83 | Ht 72.0 in | Wt 286.0 lb

## 2024-05-06 DIAGNOSIS — E785 Hyperlipidemia, unspecified: Secondary | ICD-10-CM | POA: Diagnosis not present

## 2024-05-06 DIAGNOSIS — E538 Deficiency of other specified B group vitamins: Secondary | ICD-10-CM

## 2024-05-06 DIAGNOSIS — K219 Gastro-esophageal reflux disease without esophagitis: Secondary | ICD-10-CM | POA: Diagnosis not present

## 2024-05-06 DIAGNOSIS — Z1211 Encounter for screening for malignant neoplasm of colon: Secondary | ICD-10-CM

## 2024-05-06 DIAGNOSIS — E1169 Type 2 diabetes mellitus with other specified complication: Secondary | ICD-10-CM

## 2024-05-06 DIAGNOSIS — M62838 Other muscle spasm: Secondary | ICD-10-CM | POA: Diagnosis not present

## 2024-05-06 DIAGNOSIS — E1142 Type 2 diabetes mellitus with diabetic polyneuropathy: Secondary | ICD-10-CM | POA: Diagnosis not present

## 2024-05-06 DIAGNOSIS — I152 Hypertension secondary to endocrine disorders: Secondary | ICD-10-CM | POA: Diagnosis not present

## 2024-05-06 DIAGNOSIS — F17200 Nicotine dependence, unspecified, uncomplicated: Secondary | ICD-10-CM | POA: Insufficient documentation

## 2024-05-06 DIAGNOSIS — E1159 Type 2 diabetes mellitus with other circulatory complications: Secondary | ICD-10-CM | POA: Diagnosis not present

## 2024-05-06 MED ORDER — CYCLOBENZAPRINE HCL 5 MG PO TABS
5.0000 mg | ORAL_TABLET | Freq: Three times a day (TID) | ORAL | 0 refills | Status: AC | PRN
Start: 2024-05-06 — End: ?

## 2024-05-06 NOTE — Assessment & Plan Note (Addendum)
 Patient with hx of muscle spasms, neck and back pain for years. Uncontrolled at this time. On exam, muscle tension and spasm present at trapezius. Has tried tizanidine , methocarbamol, and Baclofen without improvement. Of note, patient is allergic to many medications including methocarbamol, gabapentin . Reports that he is NOT allergic to flexeril , and this has worked for him in the past. - Will start flexeril  5mg  TID PRN - Recommend gentle stretching at home, consider PT referral - Considered starting Cymbalta for chronic pain, although patient does have hx of bipolar dx. Unclear if he has had true manic episodes.  Will discuss with patient's psychiatrist. - Follow up in 1 month

## 2024-05-06 NOTE — Assessment & Plan Note (Addendum)
 Uncontrolled at this time. Last A1c of 8.4 on 12/02/23. Not taking any medications. Has been working on controlling with diet + exercise. Previous severe allergic reaction to Farxiga . Has refused GLP1 and metformin  in the past due to concern for side effects. Suspect that his worsening neuropathy is 2/2 uncontrolled diabetes, possible B12 deficiency. - Will check A1c today, consider starting glipizide - Will obtain UACr, GFR - follow up in 1 month for foot exam, eye exam, and medication titration

## 2024-05-06 NOTE — Assessment & Plan Note (Addendum)
 Uncontrolled at this time. LDL previously elevated as below. Has not tolerated statins (Crestor ) previously due to muscle aches.  - Check lipid panel today - Consider starting Zetia given statin intolerance  Lab Results  Component Value Date   LDLCALC 129 (H) 04/05/2020

## 2024-05-06 NOTE — Assessment & Plan Note (Addendum)
 Uncontrolled based on BP in clinic today, but previously well controlled. Currently taking lisinopril  5mg  BID. Discussed increasing lisinopril  but patient would like to defer for now. Of note, has not been able to tolerate ARB, amlodipine , or thiazide previously due to various allergic rxns.  - Continue lisinopril  5mg  BID - Recommend patient get home BP cuff to take BP at home - Consider increasing lisinopril  at next appt  BP Readings from Last 3 Encounters:  05/06/24 (!) 156/101  03/02/24 130/80  11/27/22 104/74

## 2024-05-06 NOTE — Assessment & Plan Note (Addendum)
 Borderline level at last appt in June. Received B12 injection at that time which helped with symptoms. Currently taking 1000mcg PO weekly (instead of daily).  - Will recheck B12 and CBC today, consider injections

## 2024-05-06 NOTE — Assessment & Plan Note (Signed)
 Well controlled on PPI. Will continue omeprazole  20mg  BID.

## 2024-05-06 NOTE — Patient Instructions (Addendum)
-   Start flexeril  5mg  up to three times daily as needed. - I will call your psychiatrist to discuss starting Cymbalta for pain. - I recommend getting a blood pressure cuff when you are able to afford one

## 2024-05-07 ENCOUNTER — Other Ambulatory Visit: Payer: Self-pay

## 2024-05-07 ENCOUNTER — Ambulatory Visit: Payer: Self-pay

## 2024-05-07 DIAGNOSIS — E781 Pure hyperglyceridemia: Secondary | ICD-10-CM

## 2024-05-07 LAB — SPECIMEN STATUS REPORT

## 2024-05-07 LAB — LIPID PANEL
Chol/HDL Ratio: 7.5 ratio — ABNORMAL HIGH (ref 0.0–5.0)
Cholesterol, Total: 240 mg/dL — ABNORMAL HIGH (ref 100–199)
HDL: 32 mg/dL — ABNORMAL LOW (ref >39)
LDL Chol Calc (NIH): 85 mg/dL (ref 0–99)
Triglycerides: 749 mg/dL (ref 0–149)
VLDL Cholesterol Cal: 123 mg/dL — ABNORMAL HIGH (ref 5–40)

## 2024-05-07 LAB — COMPREHENSIVE METABOLIC PANEL WITH GFR
ALT: 41 IU/L (ref 0–44)
AST: 40 IU/L (ref 0–40)
Albumin: 4.8 g/dL (ref 3.9–4.9)
Alkaline Phosphatase: 62 IU/L (ref 44–121)
BUN/Creatinine Ratio: 11 (ref 10–24)
BUN: 13 mg/dL (ref 8–27)
Bilirubin Total: 0.4 mg/dL (ref 0.0–1.2)
CO2: 19 mmol/L — ABNORMAL LOW (ref 20–29)
Calcium: 9.8 mg/dL (ref 8.6–10.2)
Chloride: 100 mmol/L (ref 96–106)
Creatinine, Ser: 1.14 mg/dL (ref 0.76–1.27)
Globulin, Total: 3.1 g/dL (ref 1.5–4.5)
Glucose: 190 mg/dL — ABNORMAL HIGH (ref 70–99)
Potassium: 4.5 mmol/L (ref 3.5–5.2)
Sodium: 137 mmol/L (ref 134–144)
Total Protein: 7.9 g/dL (ref 6.0–8.5)
eGFR: 73 mL/min/1.73 (ref >59)

## 2024-05-07 LAB — CBC
Hematocrit: 52.7 % — ABNORMAL HIGH (ref 37.5–51.0)
Hemoglobin: 17.6 g/dL (ref 13.0–17.7)
MCH: 30.6 pg (ref 26.6–33.0)
MCHC: 33.4 g/dL (ref 31.5–35.7)
MCV: 92 fL (ref 79–97)
Platelets: 233 x10E3/uL (ref 150–450)
RBC: 5.75 x10E6/uL (ref 4.14–5.80)
RDW: 13.2 % (ref 11.6–15.4)
WBC: 9.6 x10E3/uL (ref 3.4–10.8)

## 2024-05-07 LAB — HEMOGLOBIN A1C
Est. average glucose Bld gHb Est-mCnc: 197 mg/dL
Hgb A1c MFr Bld: 8.5 % — ABNORMAL HIGH (ref 4.8–5.6)

## 2024-05-07 LAB — MICROALBUMIN / CREATININE URINE RATIO
Creatinine, Urine: 241.6 mg/dL
Microalb/Creat Ratio: 9 mg/g{creat} (ref 0–29)
Microalbumin, Urine: 22.9 ug/mL

## 2024-05-07 LAB — VITAMIN B12: Vitamin B-12: 469 pg/mL (ref 232–1245)

## 2024-05-12 DIAGNOSIS — Z419 Encounter for procedure for purposes other than remedying health state, unspecified: Secondary | ICD-10-CM | POA: Diagnosis not present

## 2024-05-12 NOTE — H&P (Signed)
   Patient: Ronald Lucas  PID: [id number]  DOB: [birth year]  SEX: Male   Patient referred by DDS for extraction all remaining teeth  CC: Tooth pain (points to #7)  Past Medical History:  High Blood Pressure, Asthma, Hay Fever or Sinus Problems, Snoring, Difficult breathing, Sleep Apnea, Smoker, Weed, Stomach Ulcers, COVID-19, Night Sweats, Mental Health problems, Bipolar disease, ADD, Obese, PTSD, Chronic Back Pain    Medications: Benadryl , Diazepam , Lisinopril , Abilify, Zestril , Omeprazole     Allergies:     Gabapentin , Latex, Penicillin, Eggs, Farxiga     Surgeries:   Heart Surgery, Lung Surgery, Leg surgery     Social History       Smoking:            Alcohol: Drug use:                             Exam: BMI 39. Multiple carious teeth # 3, 6, 7, 8, 9, 10, 11, 14, 22, 23, 24, 25, 26, 27, 31 remain. Bilateral mandibular lingual tori. No purulence, edema, fluctuance, trismus. Oral cancer screening negative. Pharynx clear. No lymphadenopathy.  Panorex: Generalized bone loss, multiple restorations/decay teeth # 3, 6, 7, 8, 9, 10, 11, 14, 22, 23, 24, 25, 26, 27, 31   Assessment: ASA . Non-restorable teeth # 3, 6, 7, 8, 9, 10, 11, 14, 22, 23, 24, 25, 26, 27, 31, bilateral mandibular lingual tori.              Plan: Extraction Teeth # 3, 6, 7, 8, 9, 10, 11, 14, 22, 23, 24, 25, 26, 27, 31, alveoloplasty, removal mandibular lingual tori bilateral.   Hospital Day surgery.                 Rx: n               Risks and complications explained. Questions answered.   Glendia EMERSON Primrose, DMD

## 2024-05-13 ENCOUNTER — Other Ambulatory Visit: Payer: Self-pay

## 2024-05-13 DIAGNOSIS — K219 Gastro-esophageal reflux disease without esophagitis: Secondary | ICD-10-CM

## 2024-05-13 NOTE — Telephone Encounter (Signed)
 Copied from CRM [id number]. Topic: Clinical - Medication Question >> May 13, 2024  8:19 AM Mia F wrote: Reason for CRM: cyclobenzaprine  (FLEXERIL ) 5 MG tablet. Pt says he stopped taking the medication because it was too harsh on his stomach. He would like to try something else if possible.

## 2024-05-13 NOTE — Telephone Encounter (Signed)
 Copied from CRM [id number]. Topic: Clinical - Medication Refill >> May 13, 2024  8:11 AM Mia F wrote: Medication: omeprazole  (PRILOSEC) 20 MG capsule  PT WANTS A HIGHER QUANTITY AS IT IS CHEAPER  Has the patient contacted their pharmacy? Yes (Agent: If no, request that the patient contact the pharmacy for the refill. If patient does not wish to contact the pharmacy document the reason why and proceed with request.) (Agent: If yes, when and what did the pharmacy advise?)  This is the patient's preferred pharmacy:  [facility] [address] [place] - [address] [place] [address] Phone: [phone] Fax: [phone]  Is this the correct pharmacy for this prescription? Yes If no, delete pharmacy and type the correct one.   Has the prescription been filled recently? Yes  Is the patient out of the medication? No  Has the patient been seen for an appointment in the last year OR does the patient have an upcoming appointment? Yes  Can we respond through MyChart? No  Agent: Please be advised that Rx refills may take up to 3 business days. We ask that you follow-up with your pharmacy.

## 2024-05-13 NOTE — Progress Notes (Signed)
 Recommend that patient be seen in clinic to discuss alternative medication.

## 2024-05-14 MED ORDER — OMEPRAZOLE 20 MG PO CPDR: 20.0000 mg | DELAYED_RELEASE_CAPSULE | Freq: Two times a day (BID) | ORAL | 0 refills | Status: DC

## 2024-05-14 NOTE — Telephone Encounter (Signed)
 Copied from CRM [id number]. Topic: Clinical - Medication Question >> May 14, 2024 12:07 PM Sophia H wrote: Reason for CRM: Patient is checking in on request for omeprazole  (PRILOSEC) 20 MG capsule, wanting to get an rx for a higher quantity as it is cheaper for him.   [facility] [address] [place] - [address] [place] [address] Phone: [phone] Fax: [phone]  Advised of 3 business day turnaround for scripts, patient very adamant about getting this done. Stating he will be out tomorrow. Ty

## 2024-05-15 ENCOUNTER — Other Ambulatory Visit: Payer: Self-pay

## 2024-05-15 ENCOUNTER — Encounter (HOSPITAL_COMMUNITY): Payer: Self-pay | Admitting: Oral Surgery

## 2024-05-15 NOTE — Progress Notes (Signed)
 PCP - Franchot Isaiah LABOR, MD Cardiologist - Denies EKG - DOS  Chest x-ray - 05/24/2019 ECHO - Denies Cardiac Cath - Denies  Sleep Apnea Denies.  OSA is listed medical hx on Dr Ransom note CPAP - Denies  DM Type II.  Pt is not taking any medications.  Last A1C on 05/06/2024 8.5.  Pt is not checking CBG's.    Blood Thinner Instructions: Denies Aspirin Instructions: Denies  ERAS Protcol - N/A NPO ordered COVID TEST- N/A  Anesthesia review: Yes, hx of htn, asthma, OSA, uncontrolled diabetes  -------------  SDW INSTRUCTIONS:  Your procedure is scheduled on Monday Aug 18th. Please report to [facility] Main Entrance A at 0745 A.M., and check in at the Admitting office. Call this number if you have problems the morning of surgery: [phone]   Remember: Do not eat or drink after midnight the night before your surgery    Medications to take morning of surgery with a sip of water include: Flexeril , Prilosec, Valium   As of today, STOP taking any Aspirin (unless otherwise instructed by your surgeon), Aleve, Naproxen, Ibuprofen , Motrin , Advil , Goody's, BC's, all herbal medications, fish oil, and all vitamins.    The Morning of Surgery Do not wear jewelry, make-up or nail polish. Do not wear lotions, powders, or perfumes/colognes, or deodorant Do not bring valuables to the hospital. [facility] is not responsible for any belongings or valuables.  If you are a smoker, DO NOT Smoke 24 hours prior to surgery  If you wear a CPAP at night please bring your mask the morning of surgery   Remember that you must have someone to transport you home after your surgery, and remain with you for 24 hours if you are discharged the same day.  Please bring cases for contacts, glasses, hearing aids, dentures or bridgework because it cannot be worn into surgery.   Patients discharged the day of surgery will not be allowed to drive home.   Please shower the NIGHT BEFORE/MORNING OF SURGERY (use  antibacterial soap like DIAL soap if possible). Wear comfortable clothes the morning of surgery. Oral Hygiene is also important to reduce your risk of infection.  Remember - BRUSH YOUR TEETH THE MORNING OF SURGERY WITH YOUR REGULAR TOOTHPASTE  Patient denies shortness of breath, fever, cough and chest pain.

## 2024-05-18 ENCOUNTER — Other Ambulatory Visit: Payer: Self-pay

## 2024-05-18 ENCOUNTER — Ambulatory Visit (HOSPITAL_COMMUNITY)
Admission: RE | Admit: 2024-05-18 | Discharge: 2024-05-18 | Disposition: A | Discharge status: Home / Self Care | Attending: Oral Surgery | Admitting: Oral Surgery

## 2024-05-18 ENCOUNTER — Ambulatory Visit (HOSPITAL_COMMUNITY): Payer: Self-pay | Admitting: Anesthesiology

## 2024-05-18 ENCOUNTER — Encounter (HOSPITAL_COMMUNITY): Payer: Self-pay | Admitting: Oral Surgery

## 2024-05-18 ENCOUNTER — Encounter (HOSPITAL_COMMUNITY)
Admission: RE | Disposition: A | Discharge status: Home / Self Care | Payer: Self-pay | Source: Home / Self Care | Attending: Oral Surgery

## 2024-05-18 DIAGNOSIS — I10 Essential (primary) hypertension: Secondary | ICD-10-CM | POA: Insufficient documentation

## 2024-05-18 DIAGNOSIS — K029 Dental caries, unspecified: Secondary | ICD-10-CM | POA: Insufficient documentation

## 2024-05-18 DIAGNOSIS — Z87891 Personal history of nicotine dependence: Secondary | ICD-10-CM

## 2024-05-18 DIAGNOSIS — F418 Other specified anxiety disorders: Secondary | ICD-10-CM | POA: Diagnosis not present

## 2024-05-18 DIAGNOSIS — G4733 Obstructive sleep apnea (adult) (pediatric): Secondary | ICD-10-CM | POA: Insufficient documentation

## 2024-05-18 DIAGNOSIS — E1165 Type 2 diabetes mellitus with hyperglycemia: Secondary | ICD-10-CM | POA: Diagnosis not present

## 2024-05-18 DIAGNOSIS — M2740 Unspecified cyst of jaw: Secondary | ICD-10-CM | POA: Diagnosis not present

## 2024-05-18 DIAGNOSIS — K048 Radicular cyst: Secondary | ICD-10-CM | POA: Insufficient documentation

## 2024-05-18 DIAGNOSIS — M272 Inflammatory conditions of jaws: Secondary | ICD-10-CM | POA: Diagnosis not present

## 2024-05-18 HISTORY — PX: TOOTH EXTRACTION: SHX859

## 2024-05-18 HISTORY — DX: Type 2 diabetes mellitus without complications: E11.9

## 2024-05-18 LAB — NO BLOOD PRODUCTS

## 2024-05-18 LAB — GLUCOSE, CAPILLARY
Glucose-Capillary: 255 mg/dL — ABNORMAL HIGH (ref 70–99)
Glucose-Capillary: 244 mg/dL — ABNORMAL HIGH (ref 70–99)

## 2024-05-18 SURGERY — DENTAL RESTORATION/EXTRACTIONS
Anesthesia: General

## 2024-05-18 MED ORDER — OXYCODONE HCL 5 MG/5ML PO SOLN: 5.0000 mg | Freq: Once | ORAL | Status: AC | PRN

## 2024-05-18 MED ORDER — MIDAZOLAM HCL 2 MG/2ML IJ SOLN
INTRAMUSCULAR | Status: AC
  Filled 2024-05-18: qty 2

## 2024-05-18 MED ORDER — LORAZEPAM 2 MG/ML IJ SOLN: 1.0000 mg | Freq: Once | INTRAMUSCULAR | Status: DC

## 2024-05-18 MED ORDER — CLINDAMYCIN PHOSPHATE 600 MG/50ML IV SOLN
600.0000 mg | INTRAVENOUS | Status: AC
  Administered 2024-05-18: 600 mg via INTRAVENOUS
  Filled 2024-05-18: qty 50

## 2024-05-18 MED ORDER — ONDANSETRON HCL 4 MG/2ML IJ SOLN: 4.0000 mg | Freq: Once | INTRAMUSCULAR | Status: DC | PRN

## 2024-05-18 MED ORDER — BUPIVACAINE-EPINEPHRINE (PF) 0.25% -1:200000 IJ SOLN
INTRAMUSCULAR | Status: AC
  Filled 2024-05-18: qty 30

## 2024-05-18 MED ORDER — LIDOCAINE 2% (20 MG/ML) 5 ML SYRINGE
INTRAMUSCULAR | Status: DC | PRN
  Administered 2024-05-18: 100 mg via INTRAVENOUS

## 2024-05-18 MED ORDER — DIAZEPAM 5 MG/ML IJ SOLN
5.0000 mg | Freq: Once | INTRAMUSCULAR | Status: AC
  Administered 2024-05-18: 5 mg via INTRAVENOUS
  Filled 2024-05-18: qty 2

## 2024-05-18 MED ORDER — OXYCODONE-ACETAMINOPHEN 5-325 MG PO TABS: 1.0000 | ORAL_TABLET | ORAL | 0 refills | Status: AC | PRN

## 2024-05-18 MED ORDER — FENTANYL CITRATE (PF) 100 MCG/2ML IJ SOLN
25.0000 ug | INTRAMUSCULAR | Status: DC | PRN
  Administered 2024-05-18: 50 ug via INTRAVENOUS

## 2024-05-18 MED ORDER — BUPIVACAINE-EPINEPHRINE 0.25% -1:200000 IJ SOLN
INTRAMUSCULAR | Status: DC | PRN
  Administered 2024-05-18: 20 mL

## 2024-05-18 MED ORDER — LACTATED RINGERS IV SOLN: INTRAVENOUS | Status: DC

## 2024-05-18 MED ORDER — SODIUM CHLORIDE 0.9 % IR SOLN
Status: DC | PRN
  Administered 2024-05-18: 1000 mL

## 2024-05-18 MED ORDER — ROCURONIUM BROMIDE 10 MG/ML (PF) SYRINGE
PREFILLED_SYRINGE | INTRAVENOUS | Status: DC | PRN
  Administered 2024-05-18: 60 mg via INTRAVENOUS

## 2024-05-18 MED ORDER — ACETAMINOPHEN 10 MG/ML IV SOLN: 1000.0000 mg | Freq: Once | INTRAVENOUS | Status: DC | PRN

## 2024-05-18 MED ORDER — FENTANYL CITRATE (PF) 250 MCG/5ML IJ SOLN
INTRAMUSCULAR | Status: AC
  Filled 2024-05-18: qty 5

## 2024-05-18 MED ORDER — PROPOFOL 10 MG/ML IV BOLUS
INTRAVENOUS | Status: AC
  Filled 2024-05-18: qty 20

## 2024-05-18 MED ORDER — DEXMEDETOMIDINE HCL IN NACL 80 MCG/20ML IV SOLN
INTRAVENOUS | Status: DC | PRN
Start: 2024-05-18 — End: 2024-05-18
  Administered 2024-05-18 (×2): 12 ug via INTRAVENOUS
  Administered 2024-05-18: 20 ug via INTRAVENOUS
  Administered 2024-05-18: 8 ug via INTRAVENOUS

## 2024-05-18 MED ORDER — ORAL CARE MOUTH RINSE: 15.0000 mL | Freq: Once | OROMUCOSAL | Status: DC

## 2024-05-18 MED ORDER — FENTANYL CITRATE (PF) 100 MCG/2ML IJ SOLN
INTRAMUSCULAR | Status: AC
  Filled 2024-05-18: qty 2

## 2024-05-18 MED ORDER — LIDOCAINE-EPINEPHRINE 2 %-1:100000 IJ SOLN
INTRAMUSCULAR | Status: DC | PRN
  Administered 2024-05-18: 20 mL via INTRADERMAL

## 2024-05-18 MED ORDER — FENTANYL CITRATE (PF) 250 MCG/5ML IJ SOLN
INTRAMUSCULAR | Status: DC | PRN
  Administered 2024-05-18: 50 ug via INTRAVENOUS
  Administered 2024-05-18: 100 ug via INTRAVENOUS

## 2024-05-18 MED ORDER — OXYMETAZOLINE HCL 0.05 % NA SOLN
NASAL | Status: DC | PRN
  Administered 2024-05-18: 1

## 2024-05-18 MED ORDER — SUGAMMADEX SODIUM 200 MG/2ML IV SOLN
INTRAVENOUS | Status: DC | PRN
  Administered 2024-05-18: 300 mg via INTRAVENOUS

## 2024-05-18 MED ORDER — CLINDAMYCIN HCL 300 MG PO CAPS: 300.0000 mg | ORAL_CAPSULE | Freq: Three times a day (TID) | ORAL | 0 refills | Status: AC

## 2024-05-18 MED ORDER — ONDANSETRON HCL 4 MG/2ML IJ SOLN
INTRAMUSCULAR | Status: DC | PRN
  Administered 2024-05-18: 4 mg via INTRAVENOUS

## 2024-05-18 MED ORDER — OXYCODONE HCL 5 MG PO TABS
ORAL_TABLET | ORAL | Status: AC
  Filled 2024-05-18: qty 1

## 2024-05-18 MED ORDER — PHENYLEPHRINE 80 MCG/ML (10ML) SYRINGE FOR IV PUSH (FOR BLOOD PRESSURE SUPPORT)
PREFILLED_SYRINGE | INTRAVENOUS | Status: DC | PRN
  Administered 2024-05-18 (×8): 160 ug via INTRAVENOUS

## 2024-05-18 MED ORDER — INSULIN ASPART 100 UNIT/ML IJ SOLN
0.0000 [IU] | INTRAMUSCULAR | Status: AC | PRN
  Administered 2024-05-18 (×2): 6 [IU] via SUBCUTANEOUS

## 2024-05-18 MED ORDER — OXYCODONE HCL 5 MG PO TABS
5.0000 mg | ORAL_TABLET | Freq: Once | ORAL | Status: AC | PRN
  Administered 2024-05-18: 5 mg via ORAL

## 2024-05-18 MED ORDER — 0.9 % SODIUM CHLORIDE (POUR BTL) OPTIME
TOPICAL | Status: DC | PRN
  Administered 2024-05-18: 1000 mL

## 2024-05-18 MED ORDER — PROPOFOL 10 MG/ML IV BOLUS
INTRAVENOUS | Status: DC | PRN
  Administered 2024-05-18: 50 mg via INTRAVENOUS
  Administered 2024-05-18: 200 mg via INTRAVENOUS

## 2024-05-18 MED ORDER — CHLORHEXIDINE GLUCONATE 0.12 % MT SOLN
OROMUCOSAL | Status: AC
  Filled 2024-05-18: qty 15

## 2024-05-18 MED ORDER — CHLORHEXIDINE GLUCONATE 0.12 % MT SOLN: 15.0000 mL | Freq: Once | OROMUCOSAL | Status: DC

## 2024-05-18 SURGICAL SUPPLY — 26 items
BAG COUNTER SPONGE SURGICOUNT (BAG) IMPLANT
BLADE SURG 15 STRL LF DISP TIS (BLADE) ×1 IMPLANT
BUR CROSS CUT FISSURE 1.6 (BURR) ×1 IMPLANT
BUR EGG ELITE 4.0 (BURR) IMPLANT
CANISTER SUCTION 3000ML PPV (SUCTIONS) ×1 IMPLANT
COVER SURGICAL LIGHT HANDLE (MISCELLANEOUS) ×1 IMPLANT
GAUZE PACKING FOLDED 2 STR (GAUZE/BANDAGES/DRESSINGS) ×1 IMPLANT
GLOVE BIO SURGEON STRL SZ8 (GLOVE) ×1 IMPLANT
GOWN STRL REUS W/ TWL LRG LVL3 (GOWN DISPOSABLE) ×1 IMPLANT
GOWN STRL REUS W/ TWL XL LVL3 (GOWN DISPOSABLE) ×1 IMPLANT
IV NS 1000ML BAXH (IV SOLUTION) ×1 IMPLANT
KIT BASIN OR (CUSTOM PROCEDURE TRAY) ×1 IMPLANT
KIT TURNOVER KIT B (KITS) ×1 IMPLANT
NDL HYPO 25GX1X1/2 BEV (NEEDLE) ×2 IMPLANT
NEEDLE HYPO 25GX1X1/2 BEV (NEEDLE) ×2 IMPLANT
NS IRRIG 1000ML POUR BTL (IV SOLUTION) ×1 IMPLANT
PAD ARMBOARD POSITIONER FOAM (MISCELLANEOUS) ×1 IMPLANT
SLEEVE IRRIGATION ELITE 7 (MISCELLANEOUS) ×1 IMPLANT
SPIKE FLUID TRANSFER (MISCELLANEOUS) IMPLANT
SUT CHROMIC 3 0 SH 27 (SUTURE) IMPLANT
SUT PLAIN 3 0 PS2 27 (SUTURE) IMPLANT
SYR BULB IRRIG 60ML STRL (SYRINGE) ×1 IMPLANT
SYR CONTROL 10ML LL (SYRINGE) ×1 IMPLANT
TRAY ENT MC OR (CUSTOM PROCEDURE TRAY) ×1 IMPLANT
TUBING IRRIGATION (MISCELLANEOUS) ×1 IMPLANT
YANKAUER SUCT BULB TIP NO VENT (SUCTIONS) ×1 IMPLANT

## 2024-05-18 NOTE — Anesthesia Postprocedure Evaluation (Signed)
 Anesthesia Post Note  Patient: Ronald Lucas  Procedure(s) Performed: DENTAL RESTORATION/EXTRACTIONS     Patient location during evaluation: PACU Anesthesia Type: General Level of consciousness: awake and alert Pain management: pain level controlled Vital Signs Assessment: post-procedure vital signs reviewed and stable Respiratory status: spontaneous breathing, nonlabored ventilation, respiratory function stable and patient connected to nasal cannula oxygen Cardiovascular status: blood pressure returned to baseline and stable Postop Assessment: no apparent nausea or vomiting Anesthetic complications: no Comments: Patient uncooperative in pacu, requiring additional IV valium  (takes at home).   No notable events documented.  Last Vitals:  Vitals:   05/18/24 1230 05/18/24 1315  BP: 90/77 (!) 123/48  Pulse: 89 85  Resp: 18 16  Temp: 37.4 C   SpO2: (!) 84% 91%    Last Pain:  Vitals:   05/18/24 1330  PainSc: Asleep                 Rome Ade

## 2024-05-18 NOTE — Transfer of Care (Signed)
 Immediate Anesthesia Transfer of Care Note  Patient: Ronald Lucas  Procedure(s) Performed: DENTAL RESTORATION/EXTRACTIONS  Patient Location: PACU  Anesthesia Type:General  Level of Consciousness: awake and pateint uncooperative  Airway & Oxygen Therapy: Patient Spontanous Breathing and Patient connected to face mask oxygen  Post-op Assessment: Report given to RN and Post -op Vital signs reviewed and stable  Post vital signs: Reviewed and stable  Last Vitals:  Vitals Value Taken Time  BP 90/77 05/18/24 12:30  Temp    Pulse 90 05/18/24 12:32  Resp 19 05/18/24 12:32  SpO2 82 % 05/18/24 12:32  Vitals shown include unfiled device data.  Last Pain:  Vitals:   05/18/24 0835  PainSc: 10-Worst pain ever         Complications: No notable events documented.

## 2024-05-18 NOTE — Anesthesia Procedure Notes (Signed)
 Procedure Name: Intubation Date/Time: 05/18/2024 10:50 AM  Performed by: Virgil Ee, CRNAPre-anesthesia Checklist: Patient identified, Patient being monitored, Timeout performed, Emergency Drugs available and Suction available Patient Re-evaluated:Patient Re-evaluated prior to induction Oxygen Delivery Method: Circle system utilized Preoxygenation: Pre-oxygenation with 100% oxygen Induction Type: IV induction Ventilation: Mask ventilation without difficulty, Two handed mask ventilation required and Oral airway inserted - appropriate to patient size Laryngoscope Size: Glidescope and 4 Grade View: Grade I Nasal Tubes: Left, Nasal prep performed and Nasal Rae Tube size: 7.5 mm Number of attempts: 1 Airway Equipment and Method: Stylet Placement Confirmation: ETT inserted through vocal cords under direct vision, positive ETCO2 and breath sounds checked- equal and bilateral Secured at: 21 cm Tube secured with: Tape Dental Injury: Teeth and Oropharynx as per pre-operative assessment
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
## 2024-05-18 NOTE — Op Note (Signed)
 05/18/2024  11:59 AM  PATIENT:  Ronald Lucas  63 y.o. male  PRE-OPERATIVE DIAGNOSIS:  NON-RESTORABLE TEETH #'S 3, 6, 7, 8, 9, 10, 11, 14, 22, 23, 24, 25, 26, 27, 31 SECONDARY TO DENTAL CARIES; BILATERAL MANDIBULAR LINGUAL TORI  POST-OPERATIVE DIAGNOSIS:  SAME+ CYST RIGHT MAXILLA  PROCEDURE:  Procedure(s): EXTRACTION  TEETH #'S 3, 6, 7, 8, 9, 10, 11, 14, 22, 23, 24, 25, 26, 27, 31 ; ALVEOLOPLASTY, REMOVAL BILATERAL MANDIBULAR LINGUAL TORI   SURGEON:  Surgeon(s): Sheryle Hamilton, DMD  ANESTHESIA:   local and general  EBL:  minimal  DRAINS: none   SPECIMEN:  Cyst right maxilla tooth # 7 area; clinical diagnosis: radicular cyst  COUNTS:  YES  PLAN OF CARE: Discharge to home after PACU  PATIENT DISPOSITION:  PACU - hemodynamically stable.   PROCEDURE DETAILS: Dictation #[id number]  Hamilton EMERSON Sheryle, DMD 05/18/2024 11:59 AM
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
## 2024-05-18 NOTE — H&P (Signed)
 H&P documentation  -History and Physical Reviewed  -Patient has been re-examined  -No change in the plan of care  Ronald Lucas

## 2024-05-19 ENCOUNTER — Encounter (HOSPITAL_COMMUNITY): Payer: Self-pay | Admitting: Oral Surgery

## 2024-05-19 LAB — SURGICAL PATHOLOGY

## 2024-05-19 NOTE — Op Note (Signed)
 Ronald Lucas, Ronald Lucas MEDICAL RECORD NO: [id number] ACCOUNT NO: [id number] DATE OF BIRTH: 11-28-1960 FACILITY: [facility] LOCATION: [facility] PHYSICIAN: Glendia EMERSON Primrose, DDS  Operative Report   DATE OF PROCEDURE: 05/18/2024  PREOPERATIVE DIAGNOSES: 1.  Nonrestorable teeth numbers 3, 6, 7, 8, 9, 10, 11, 14, 22, 23, 24, 25, 26, 27, and 31 secondary to dental caries. 2.  Bilateral mandibular lingual tori.  POSTOPERATIVE DIAGNOSES: 1.  Nonrestorable teeth numbers 3, 6, 7, 8, 9, 10, 11, 14, 22, 23, 24, 25, 26, 27, and 31 secondary to dental caries. 2.  Bilateral mandibular lingual tori. 3.  Cyst, right maxilla.  PROCEDURE:  Extraction of teeth numbers 3, 6, 7, 8, 9, 10, 11, 14, 22, 23, 24, 25, 26, 27, and 31, alveoloplasty, right and left maxilla and mandible, removal of bilateral mandibular lingual tori.  SURGEON:  Glendia EMERSON Primrose, DDS  ANESTHESIA:  General.  Nasal intubation.  Dr. Boone attending.  DESCRIPTION OF PROCEDURE:  The patient was taken to the operating room and placed on the table in supine position.  General anesthesia was administered.  A nasal endotracheal tube was placed and secured.  The eyes were protected.  The patient was prepped  for surgery.  A timeout was performed.  The posterior pharynx was suctioned and a throat pack was placed.  2% lidocaine  with 1:100,000 epinephrine  was infiltrated in an inferior alveolar block on the right and left side with buccal anterior infiltration  in the mandible.  Local anesthesia was administered in the maxillary buccal and palatal around the teeth to be removed.  The 15 blade was used to make an incision at the molar region in the lower left along the alveolar crest, which was edentulous, and  carried forward to tooth #22.  The incision was created around teeth numbers 22, 23, 24, 25, and 26 in both the buccal and lingual sulcus.  The periosteum was reflected from around the teeth.  The teeth were elevated with a 301 elevator and removed from   the mouth with Ash forceps.  The periosteum was reflected to expose the alveolar crest, which had buccal hyperplasia of the superior most aspect of the alveolus causing an undercut.  Alveoloplasty was performed using the egg burr followed by the bone  file.  The lingual torus was removed using the egg burr and the Stryker handpiece to recontour the lingual surface of the bone from the canine region to the molar region.  The bone file was used to further smooth this area and it was irrigated and closed  with 3-0 chromic.  Attention was turned to the left maxilla.  The 15 blade was used to make an incision circumferentially around tooth #14 in the gingival sulcus, carried forward to teeth numbers 11, 10, 9, 8, and 7 in the buccal and palatal sulcus.   The periosteum was reflected.  The teeth were elevated.  Teeth numbers 9, 8, and 7 were removed with the dental forceps.  Teeth number 10, 11, and 14 fractured upon attempted removal necessitating removal of bone from around the teeth.  Tooth #14 palatal  root was retained and was removed with the Stryker handpiece and fissure bur and the 301 elevator.  Teeth numbers 10 and 11 were removed with the 301 elevator as well.  The sockets were curetted.  The periosteum was reflected to expose the alveolar  crest.  It was irregular and had sharp bony contacts.  Alveoloplasty was performed using the Egg Bur followed by the bone file.  Attention was turned to the right mandible.  A 15 blade was used to make an incision around tooth #31 circumferentially in  the gingival sulcus and carried forward over the edentulous space to tooth #27.  The periosteum was reflected.  Teeth numbers 27 and 31 were elevated, but tooth #27 was removed with forceps.  Tooth #31 was fractured upon attempted removal.  The Stryker  handpiece with fissure bur was used to section the roots and remove bone around the individual roots and elevate them with a 301 elevator.  The sockets were curetted.   Alveoloplasty was performed in the right anterior mandible with the Stryker handpiece  and fissure bur followed by the bone file then this area was left as the lingual tori was removed using the Stryker handpiece and Egg Bur to recontour the bone from the canine region to the molar region.  The right mandible was irrigated and closed with  2-0 chromic.  The right maxilla was operated.  A 15 blade was used to make an incision around tooth #3 and carried forward on the edentulous alveolar crest to tooth #6.  The periosteum was reflected from around these teeth.  The teeth were elevated and  removed from the mouth with dental forceps.  The sockets were curetted.  There was a cystic lesion noted above tooth #7.  The dental curette and periosteal elevator were used to remove soft tissue so that the cyst could be enucleated.  It was sent for  surgical pathology.  Alveoloplasty was performed in the right maxilla with the Stryker handpiece and egg burr.  The right maxilla was closed with 2-0 chromic.  The oral cavity was irrigated and suctioned.  Additional local anesthesia of Marcaine  0.25%  with epinephrine  was administered and the throat pack was removed.  The patient was left in care of anesthesia for extubation and transport to recovery with plans for discharge home through day surgery.  ESTIMATED BLOOD LOSS:  Minimum.  COMPLICATIONS:  None.  SPECIMENS:  Cyst, right maxilla.  PRELIMINARY DIAGNOSIS:  Radicular cyst.   PUS D: 05/18/2024 12:07:59 pm T: 05/19/2024 2:22:00 am  JOB: [id number]/ [id number]

## 2024-06-04 ENCOUNTER — Ambulatory Visit

## 2024-06-12 DIAGNOSIS — Z419 Encounter for procedure for purposes other than remedying health state, unspecified: Secondary | ICD-10-CM | POA: Diagnosis not present

## 2024-07-12 DIAGNOSIS — Z419 Encounter for procedure for purposes other than remedying health state, unspecified: Secondary | ICD-10-CM | POA: Diagnosis not present

## 2024-08-11 ENCOUNTER — Other Ambulatory Visit: Payer: Self-pay

## 2024-08-11 DIAGNOSIS — K219 Gastro-esophageal reflux disease without esophagitis: Secondary | ICD-10-CM

## 2024-08-11 NOTE — Telephone Encounter (Unsigned)
 Copied from CRM [id number]. Topic: Clinical - Medication Refill >> Aug 11, 2024 10:09 AM Franky GRADE wrote: Medication: omeprazole  (PRILOSEC) 20 MG capsule [[id number]]  Has the patient contacted their pharmacy? Yes, they asked patient to contact the office. (Agent: If no, request that the patient contact the pharmacy for the refill. If patient does not wish to contact the pharmacy document the reason why and proceed with request.) (Agent: If yes, when and what did the pharmacy advise?)  This is the patient's preferred pharmacy:   [id number] [address], [place] - [address] [phone]  Is this the correct pharmacy for this prescription? Yes If no, delete pharmacy and type the correct one.   Has the prescription been filled recently? No  Is the patient out of the medication? No  Has the patient been seen for an appointment in the last year OR does the patient have an upcoming appointment? Yes, patient recently had a dental procedure and is recovering, he will schedule an appointment once he has healed.   Can we respond through MyChart? Yes  Agent: Please be advised that Rx refills may take up to 3 business days. We ask that you follow-up with your pharmacy.

## 2024-08-12 ENCOUNTER — Other Ambulatory Visit: Payer: Self-pay

## 2024-08-12 DIAGNOSIS — K219 Gastro-esophageal reflux disease without esophagitis: Secondary | ICD-10-CM

## 2024-08-12 DIAGNOSIS — I10 Essential (primary) hypertension: Secondary | ICD-10-CM

## 2024-08-12 NOTE — Telephone Encounter (Signed)
 Copied from CRM [id number]. Topic: Clinical - Medication Refill >> Aug 11, 2024 10:09 AM Franky GRADE wrote: Medication: omeprazole  (PRILOSEC) 20 MG capsule [[id number]]  Has the patient contacted their pharmacy? Yes, they asked patient to contact the office. (Agent: If no, request that the patient contact the pharmacy for the refill. If patient does not wish to contact the pharmacy document the reason why and proceed with request.) (Agent: If yes, when and what did the pharmacy advise?)  This is the patient's preferred pharmacy:   [id number] [address], [place] - [address] [phone]  Is this the correct pharmacy for this prescription? Yes If no, delete pharmacy and type the correct one.   Has the prescription been filled recently? No  Is the patient out of the medication? No  Has the patient been seen for an appointment in the last year OR does the patient have an upcoming appointment? Yes, patient recently had a dental procedure and is recovering, he will schedule an appointment once he has healed.   Can we respond through MyChart? Yes  Agent: Please be advised that Rx refills may take up to 3 business days. We ask that you follow-up with your pharmacy.

## 2024-08-12 NOTE — Telephone Encounter (Unsigned)
 Copied from CRM [id number]. Topic: Clinical - Medication Refill >> Aug 12, 2024  3:01 PM Montie POUR wrote: Medication:  lisinopril  (ZESTRIL ) 5 MG tablet  Has the patient contacted their pharmacy? Yes (Agent: If no, request that the patient contact the pharmacy for the refill. If patient does not wish to contact the pharmacy document the reason why and proceed with request.) (Agent: If yes, when and what did the pharmacy advise?) Pharmacy needs order to refill  This is the patient's preferred pharmacy:  [id number] [place], [place] - [address] AT [facility] OF [address] [place] [address] Phone: [phone] Fax: [phone]  Is this the correct pharmacy for this prescription? Yes If no, delete pharmacy and type the correct one.   Has the prescription been filled recently? No  Is the patient out of the medication? No He has enough for a little over a week.  Has the patient been seen for an appointment in the last year OR does the patient have an upcoming appointment? Yes  Can we respond through MyChart? No  Agent: Please be advised that Rx refills may take up to 3 business days. We ask that you follow-up with your pharmacy.

## 2024-08-13 MED ORDER — OMEPRAZOLE 20 MG PO CPDR: 20.0000 mg | DELAYED_RELEASE_CAPSULE | Freq: Two times a day (BID) | ORAL | 0 refills | Status: AC

## 2024-08-13 NOTE — Telephone Encounter (Signed)
 Duplicate request, refilled 08/13/24.  Requested Prescriptions  Pending Prescriptions Disp Refills   omeprazole  (PRILOSEC) 20 MG capsule 180 capsule 0    Sig: Take 1 capsule (20 mg total) by mouth 2 (two) times daily before a meal.     Gastroenterology: Proton Pump Inhibitors Passed - 08/13/2024 11:33 AM      Passed - Valid encounter within last 12 months    Recent Outpatient Visits           3 months ago Hypertension associated with diabetes [facility])   [facility] Franchot Isaiah LABOR, MD   5 months ago Muscle cramps   [facility] [place], [place]

## 2024-08-13 NOTE — Telephone Encounter (Signed)
 Requested Prescriptions  Pending Prescriptions Disp Refills   omeprazole  (PRILOSEC) 20 MG capsule 180 capsule 0    Sig: Take 1 capsule (20 mg total) by mouth 2 (two) times daily before a meal.     Gastroenterology: Proton Pump Inhibitors Passed - 08/13/2024 11:27 AM      Passed - Valid encounter within last 12 months    Recent Outpatient Visits           3 months ago Hypertension associated with diabetes [facility])   [facility] Franchot Isaiah LABOR, MD   5 months ago Muscle cramps   [facility] [place], [place]

## 2024-08-14 MED ORDER — LISINOPRIL 5 MG PO TABS: 5.0000 mg | ORAL_TABLET | Freq: Two times a day (BID) | ORAL | 0 refills | Status: DC

## 2024-08-14 NOTE — Telephone Encounter (Signed)
 Requested Prescriptions  Pending Prescriptions Disp Refills   lisinopril  (ZESTRIL ) 5 MG tablet 180 tablet 1    Sig: Take 1 tablet (5 mg total) by mouth 2 (two) times daily.     Cardiovascular:  ACE Inhibitors Failed - 08/14/2024  1:19 PM      Failed - Last BP in normal range    BP Readings from Last 1 Encounters:  05/18/24 (!) 147/93         Passed - Cr in normal range and within 180 days    Creat  Date Value Ref Range Status  03/02/2024 1.08 0.70 - 1.35 mg/dL Final   Creatinine, Ser  Date Value Ref Range Status  05/06/2024 1.14 0.76 - 1.27 mg/dL Final   Creatinine, Urine  Date Value Ref Range Status  06/29/2020 132 20 - 320 mg/dL Final         Passed - K in normal range and within 180 days    Potassium  Date Value Ref Range Status  05/06/2024 4.5 3.5 - 5.2 mmol/L Final  01/27/2015 4.3 mmol/L Final    Comment:    3.5-5.1 NOTE: New Reference Range  12/07/14          Passed - Patient is not pregnant      Passed - Valid encounter within last 6 months    Recent Outpatient Visits           3 months ago Hypertension associated with diabetes [facility])   [facility] Franchot Isaiah LABOR, MD   5 months ago Muscle cramps   [facility] [place], [place]

## 2024-08-29 ENCOUNTER — Other Ambulatory Visit: Payer: Self-pay

## 2024-08-29 DIAGNOSIS — I10 Essential (primary) hypertension: Secondary | ICD-10-CM

## 2024-09-11 DIAGNOSIS — Z419 Encounter for procedure for purposes other than remedying health state, unspecified: Secondary | ICD-10-CM | POA: Diagnosis not present

## 2024-10-30 ENCOUNTER — Telehealth: Payer: Self-pay

## 2024-10-30 ENCOUNTER — Ambulatory Visit: Payer: Self-pay

## 2024-10-30 NOTE — Telephone Encounter (Signed)
 3rd attempt--attempted phone number twice and left a voicemail on the 3rd attempt  Attempted both phone numbers  Routing to the clinic at this time Unable to contact patient Please reach out to patient   FYI Only or Action Required?: Action required by provider: Attempted to contact patient for triage multiple times and left voicemails---no contact was made--please reach out to patient.  Patient was last seen in primary care on 05/06/2024 by Franchot Isaiah LABOR, MD.  Called Nurse Triage reporting Back Pain.  Triage Disposition: No Contact Calls  Patient/caregiver understands and will follow disposition?: Yes             Message from Kendralyn S sent at 10/30/2024  9:07 AM EST   Reason for Triage: pain in back getting worse, has had it for a long time Reason for Disposition  Third attempt to contact caller AND no contact made. Phone number verified.  Answer Assessment - Initial Assessment Questions Attempted to call the patient three times and left three voicemails. Left voicemails on 2nd call with each attempt  Routing to the clinic at this time Unable to contact patient Please reach out to patient          Message from Kendralyn S sent at 10/30/2024  9:07 AM EST   Reason for Triage: pain in back getting worse, has had it for a long time  Protocols used: No Contact or Duplicate Contact Call-A-AH

## 2024-10-30 NOTE — Telephone Encounter (Signed)
Has upcoming appointment scheduled

## 2024-10-30 NOTE — Telephone Encounter (Unsigned)
 Copied from CRM [id number]. Topic: Clinical - Refused Triage >> Oct 30, 2024  9:18 AM Joesph NOVAK wrote: Patient/caller voiced complaints of back pain worsening. Declined transfer to triage.  If patient is unestablished, route message to [facility] Nurse Triage If patient is established, route message to the appropriate department clinical pool

## 2024-10-30 NOTE — Telephone Encounter (Signed)
 1st attempt at calling patient. Called twice and left a voicemail on the 2nd attempt        Message from Kendralyn S sent at 10/30/2024  9:07 AM EST  Reason for Triage: pain in back getting worse, has had it for a long time

## 2024-10-30 NOTE — Telephone Encounter (Signed)
 2nd attempt--attempted phone number twice and left a voicemail on the 2nd attempt         Message from Kendralyn S sent at 10/30/2024  9:07 AM EST  Reason for Triage: pain in back getting worse, has had it for a long time

## 2024-11-10 ENCOUNTER — Ambulatory Visit
